# Patient Record
Sex: Female | Born: 1945 | Race: White | Hispanic: No | Marital: Single | Smoking: Current every day smoker
Health system: Southern US, Community
[De-identification: ages and names within clinical notes are randomized; demographics above are authoritative.]

## PROBLEM LIST (undated history)

## (undated) DIAGNOSIS — Z8673 Personal history of transient ischemic attack (TIA), and cerebral infarction without residual deficits: Secondary | ICD-10-CM

## (undated) DIAGNOSIS — F32A Depression, unspecified: Secondary | ICD-10-CM

## (undated) DIAGNOSIS — Z8669 Personal history of other diseases of the nervous system and sense organs: Secondary | ICD-10-CM

## (undated) DIAGNOSIS — K219 Gastro-esophageal reflux disease without esophagitis: Secondary | ICD-10-CM

## (undated) DIAGNOSIS — F329 Major depressive disorder, single episode, unspecified: Secondary | ICD-10-CM

## (undated) DIAGNOSIS — E119 Type 2 diabetes mellitus without complications: Secondary | ICD-10-CM

## (undated) DIAGNOSIS — N201 Calculus of ureter: Secondary | ICD-10-CM

## (undated) DIAGNOSIS — Z87442 Personal history of urinary calculi: Secondary | ICD-10-CM

## (undated) DIAGNOSIS — J189 Pneumonia, unspecified organism: Secondary | ICD-10-CM

## (undated) DIAGNOSIS — Z973 Presence of spectacles and contact lenses: Secondary | ICD-10-CM

## (undated) DIAGNOSIS — E785 Hyperlipidemia, unspecified: Secondary | ICD-10-CM

## (undated) DIAGNOSIS — M1990 Unspecified osteoarthritis, unspecified site: Secondary | ICD-10-CM

## (undated) DIAGNOSIS — E039 Hypothyroidism, unspecified: Secondary | ICD-10-CM

## (undated) DIAGNOSIS — I10 Essential (primary) hypertension: Secondary | ICD-10-CM

## (undated) DIAGNOSIS — R011 Cardiac murmur, unspecified: Secondary | ICD-10-CM

## (undated) DIAGNOSIS — R3915 Urgency of urination: Secondary | ICD-10-CM

## (undated) HISTORY — DX: Essential (primary) hypertension: I10

## (undated) HISTORY — PX: TONSILLECTOMY: SUR1361

## (undated) HISTORY — DX: Gastro-esophageal reflux disease without esophagitis: K21.9

## (undated) HISTORY — DX: Hyperlipidemia, unspecified: E78.5

## (undated) HISTORY — PX: TOTAL ABDOMINAL HYSTERECTOMY W/ BILATERAL SALPINGOOPHORECTOMY: SHX83

## (undated) HISTORY — PX: KNEE ARTHROSCOPY: SHX127

## (undated) HISTORY — PX: CARPAL TUNNEL RELEASE: SHX101

---

## 1975-06-02 HISTORY — PX: OTHER SURGICAL HISTORY: SHX169

## 1997-07-18 ENCOUNTER — Ambulatory Visit (HOSPITAL_COMMUNITY): Admission: RE | Admit: 1997-07-18 | Discharge: 1997-07-18 | Payer: Self-pay | Admitting: Obstetrics & Gynecology

## 1998-07-22 ENCOUNTER — Ambulatory Visit (HOSPITAL_COMMUNITY): Admission: RE | Admit: 1998-07-22 | Discharge: 1998-07-22 | Payer: Self-pay | Admitting: Family Medicine

## 1998-07-22 ENCOUNTER — Encounter: Payer: Self-pay | Admitting: Family Medicine

## 1999-07-30 ENCOUNTER — Encounter: Payer: Self-pay | Admitting: Obstetrics & Gynecology

## 1999-07-30 ENCOUNTER — Ambulatory Visit (HOSPITAL_COMMUNITY): Admission: RE | Admit: 1999-07-30 | Discharge: 1999-07-30 | Payer: Self-pay | Admitting: Obstetrics & Gynecology

## 1999-12-10 ENCOUNTER — Other Ambulatory Visit: Admission: RE | Admit: 1999-12-10 | Discharge: 1999-12-10 | Payer: Self-pay | Admitting: Obstetrics & Gynecology

## 2000-08-10 ENCOUNTER — Encounter: Payer: Self-pay | Admitting: Obstetrics & Gynecology

## 2000-08-10 ENCOUNTER — Ambulatory Visit (HOSPITAL_COMMUNITY): Admission: RE | Admit: 2000-08-10 | Discharge: 2000-08-10 | Payer: Self-pay | Admitting: Obstetrics & Gynecology

## 2001-01-03 ENCOUNTER — Other Ambulatory Visit: Admission: RE | Admit: 2001-01-03 | Discharge: 2001-01-03 | Payer: Self-pay | Admitting: Obstetrics & Gynecology

## 2001-02-17 ENCOUNTER — Encounter: Payer: Self-pay | Admitting: Family Medicine

## 2001-02-17 ENCOUNTER — Encounter: Admission: RE | Admit: 2001-02-17 | Discharge: 2001-02-17 | Payer: Self-pay | Admitting: Family Medicine

## 2001-08-11 ENCOUNTER — Encounter: Payer: Self-pay | Admitting: Obstetrics & Gynecology

## 2001-08-11 ENCOUNTER — Ambulatory Visit (HOSPITAL_COMMUNITY): Admission: RE | Admit: 2001-08-11 | Discharge: 2001-08-11 | Payer: Self-pay | Admitting: Obstetrics & Gynecology

## 2002-01-23 ENCOUNTER — Other Ambulatory Visit: Admission: RE | Admit: 2002-01-23 | Discharge: 2002-01-23 | Payer: Self-pay | Admitting: Obstetrics & Gynecology

## 2002-06-19 ENCOUNTER — Encounter: Payer: Self-pay | Admitting: Family Medicine

## 2002-06-19 ENCOUNTER — Encounter: Admission: RE | Admit: 2002-06-19 | Discharge: 2002-06-19 | Payer: Self-pay | Admitting: Family Medicine

## 2002-06-29 ENCOUNTER — Encounter: Payer: Self-pay | Admitting: Family Medicine

## 2002-06-29 ENCOUNTER — Encounter: Admission: RE | Admit: 2002-06-29 | Discharge: 2002-06-29 | Payer: Self-pay | Admitting: Family Medicine

## 2002-07-06 ENCOUNTER — Encounter: Payer: Self-pay | Admitting: Specialist

## 2002-07-12 ENCOUNTER — Inpatient Hospital Stay (HOSPITAL_COMMUNITY): Admission: RE | Admit: 2002-07-12 | Discharge: 2002-07-13 | Payer: Self-pay | Admitting: Specialist

## 2002-07-12 HISTORY — PX: SHOULDER OPEN ROTATOR CUFF REPAIR: SHX2407

## 2002-11-15 ENCOUNTER — Encounter: Payer: Self-pay | Admitting: Specialist

## 2002-11-17 ENCOUNTER — Inpatient Hospital Stay (HOSPITAL_COMMUNITY): Admission: RE | Admit: 2002-11-17 | Discharge: 2002-11-23 | Payer: Self-pay | Admitting: Specialist

## 2002-11-17 ENCOUNTER — Encounter: Payer: Self-pay | Admitting: Specialist

## 2002-11-17 HISTORY — PX: TOTAL KNEE ARTHROPLASTY: SHX125

## 2003-02-21 ENCOUNTER — Other Ambulatory Visit: Admission: RE | Admit: 2003-02-21 | Discharge: 2003-02-21 | Payer: Self-pay | Admitting: Obstetrics & Gynecology

## 2003-07-10 ENCOUNTER — Ambulatory Visit (HOSPITAL_COMMUNITY): Admission: RE | Admit: 2003-07-10 | Discharge: 2003-07-10 | Payer: Self-pay | Admitting: Family Medicine

## 2004-03-19 ENCOUNTER — Other Ambulatory Visit: Admission: RE | Admit: 2004-03-19 | Discharge: 2004-03-19 | Payer: Self-pay | Admitting: Obstetrics and Gynecology

## 2004-09-03 ENCOUNTER — Ambulatory Visit (HOSPITAL_COMMUNITY): Admission: RE | Admit: 2004-09-03 | Discharge: 2004-09-03 | Payer: Self-pay | Admitting: Obstetrics & Gynecology

## 2004-10-21 ENCOUNTER — Encounter: Admission: RE | Admit: 2004-10-21 | Discharge: 2004-10-21 | Payer: Self-pay | Admitting: Specialist

## 2005-01-05 ENCOUNTER — Inpatient Hospital Stay (HOSPITAL_COMMUNITY): Admission: RE | Admit: 2005-01-05 | Discharge: 2005-01-08 | Payer: Self-pay | Admitting: Specialist

## 2005-01-05 HISTORY — PX: REVISION TOTAL KNEE ARTHROPLASTY: SUR1280

## 2005-04-14 ENCOUNTER — Other Ambulatory Visit: Admission: RE | Admit: 2005-04-14 | Discharge: 2005-04-14 | Payer: Self-pay | Admitting: Obstetrics & Gynecology

## 2005-10-01 ENCOUNTER — Ambulatory Visit (HOSPITAL_COMMUNITY): Admission: RE | Admit: 2005-10-01 | Discharge: 2005-10-01 | Payer: Self-pay | Admitting: Obstetrics & Gynecology

## 2005-10-12 ENCOUNTER — Ambulatory Visit: Payer: Self-pay | Admitting: Gastroenterology

## 2005-11-16 ENCOUNTER — Encounter (INDEPENDENT_AMBULATORY_CARE_PROVIDER_SITE_OTHER): Payer: Self-pay | Admitting: *Deleted

## 2005-11-16 ENCOUNTER — Ambulatory Visit: Payer: Self-pay | Admitting: Gastroenterology

## 2006-09-28 ENCOUNTER — Inpatient Hospital Stay (HOSPITAL_COMMUNITY): Admission: AD | Admit: 2006-09-28 | Discharge: 2006-09-28 | Payer: Self-pay | Admitting: Family Medicine

## 2006-10-06 ENCOUNTER — Ambulatory Visit (HOSPITAL_COMMUNITY): Admission: RE | Admit: 2006-10-06 | Discharge: 2006-10-06 | Payer: Self-pay | Admitting: Obstetrics & Gynecology

## 2006-12-23 ENCOUNTER — Encounter: Admission: RE | Admit: 2006-12-23 | Discharge: 2006-12-23 | Payer: Self-pay | Admitting: Specialist

## 2007-10-07 ENCOUNTER — Ambulatory Visit (HOSPITAL_COMMUNITY): Admission: RE | Admit: 2007-10-07 | Discharge: 2007-10-07 | Payer: Self-pay | Admitting: Obstetrics & Gynecology

## 2007-10-10 ENCOUNTER — Inpatient Hospital Stay (HOSPITAL_COMMUNITY): Admission: AD | Admit: 2007-10-10 | Discharge: 2007-10-10 | Payer: Self-pay | Admitting: Family Medicine

## 2009-07-26 ENCOUNTER — Ambulatory Visit (HOSPITAL_COMMUNITY): Admission: RE | Admit: 2009-07-26 | Discharge: 2009-07-26 | Payer: Self-pay | Admitting: Geriatric Medicine

## 2010-02-12 ENCOUNTER — Encounter
Admission: RE | Admit: 2010-02-12 | Discharge: 2010-02-27 | Payer: Self-pay | Source: Home / Self Care | Admitting: Surgery

## 2010-02-17 ENCOUNTER — Ambulatory Visit (HOSPITAL_COMMUNITY): Admission: RE | Admit: 2010-02-17 | Discharge: 2010-02-17 | Payer: Self-pay | Admitting: Surgery

## 2010-02-24 ENCOUNTER — Ambulatory Visit (HOSPITAL_COMMUNITY): Admission: RE | Admit: 2010-02-24 | Discharge: 2010-02-24 | Payer: Self-pay | Admitting: Surgery

## 2010-03-06 ENCOUNTER — Ambulatory Visit (HOSPITAL_BASED_OUTPATIENT_CLINIC_OR_DEPARTMENT_OTHER): Admission: RE | Admit: 2010-03-06 | Discharge: 2010-03-06 | Payer: Self-pay | Admitting: Surgery

## 2010-03-08 ENCOUNTER — Ambulatory Visit: Payer: Self-pay | Admitting: Internal Medicine

## 2010-06-26 ENCOUNTER — Other Ambulatory Visit (HOSPITAL_COMMUNITY): Payer: Self-pay | Admitting: Geriatric Medicine

## 2010-06-26 DIAGNOSIS — Z1231 Encounter for screening mammogram for malignant neoplasm of breast: Secondary | ICD-10-CM

## 2010-06-26 DIAGNOSIS — Z139 Encounter for screening, unspecified: Secondary | ICD-10-CM

## 2010-07-28 ENCOUNTER — Encounter (HOSPITAL_COMMUNITY): Payer: Self-pay

## 2010-07-28 ENCOUNTER — Ambulatory Visit (HOSPITAL_COMMUNITY)
Admission: RE | Admit: 2010-07-28 | Discharge: 2010-07-28 | Disposition: A | Discharge status: Home / Self Care | Payer: Medicare Other | Source: Ambulatory Visit | Attending: Geriatric Medicine | Admitting: Geriatric Medicine

## 2010-07-28 DIAGNOSIS — Z1231 Encounter for screening mammogram for malignant neoplasm of breast: Secondary | ICD-10-CM | POA: Insufficient documentation

## 2010-07-31 ENCOUNTER — Encounter: Payer: Medicare Other | Attending: Surgery

## 2010-07-31 DIAGNOSIS — Z713 Dietary counseling and surveillance: Secondary | ICD-10-CM | POA: Insufficient documentation

## 2010-07-31 DIAGNOSIS — Z01818 Encounter for other preprocedural examination: Secondary | ICD-10-CM | POA: Insufficient documentation

## 2010-08-11 ENCOUNTER — Other Ambulatory Visit: Payer: Self-pay | Admitting: Surgery

## 2010-08-11 ENCOUNTER — Encounter (HOSPITAL_COMMUNITY): Payer: Medicare Other

## 2010-08-11 LAB — COMPREHENSIVE METABOLIC PANEL
AST: 24 U/L (ref 0–37)
Albumin: 4.3 g/dL (ref 3.5–5.2)
Alkaline Phosphatase: 40 U/L (ref 39–117)
Chloride: 102 mEq/L (ref 96–112)
Creatinine, Ser: 1.3 mg/dL — ABNORMAL HIGH (ref 0.4–1.2)
GFR calc Af Amer: 50 mL/min — ABNORMAL LOW (ref >60)
Potassium: 4.2 mEq/L (ref 3.5–5.1)
Total Bilirubin: 0.6 mg/dL (ref 0.3–1.2)

## 2010-08-11 LAB — DIFFERENTIAL
Basophils Absolute: 0 10*3/uL (ref 0.0–0.1)
Lymphocytes Relative: 20 % (ref 12–46)
Neutro Abs: 6.9 10*3/uL (ref 1.7–7.7)
Neutrophils Relative %: 71 % (ref 43–77)

## 2010-08-11 LAB — CBC
Platelets: 303 10*3/uL (ref 150–400)
RDW: 13 % (ref 11.5–15.5)
WBC: 9.8 10*3/uL (ref 4.0–10.5)

## 2010-08-18 ENCOUNTER — Inpatient Hospital Stay (HOSPITAL_COMMUNITY)
Admission: RE | Admit: 2010-08-18 | Discharge: 2010-08-21 | DRG: 620 | Disposition: A | Discharge status: Home / Self Care | Payer: Medicare Other | Source: Ambulatory Visit | Attending: Surgery | Admitting: Surgery

## 2010-08-18 DIAGNOSIS — Z01818 Encounter for other preprocedural examination: Secondary | ICD-10-CM

## 2010-08-18 DIAGNOSIS — E7800 Pure hypercholesterolemia, unspecified: Secondary | ICD-10-CM | POA: Diagnosis present

## 2010-08-18 DIAGNOSIS — K436 Other and unspecified ventral hernia with obstruction, without gangrene: Secondary | ICD-10-CM | POA: Diagnosis present

## 2010-08-18 DIAGNOSIS — Z6841 Body Mass Index (BMI) 40.0 and over, adult: Secondary | ICD-10-CM

## 2010-08-18 DIAGNOSIS — M129 Arthropathy, unspecified: Secondary | ICD-10-CM | POA: Diagnosis present

## 2010-08-18 DIAGNOSIS — Z01812 Encounter for preprocedural laboratory examination: Secondary | ICD-10-CM

## 2010-08-18 DIAGNOSIS — I10 Essential (primary) hypertension: Secondary | ICD-10-CM | POA: Diagnosis present

## 2010-08-18 DIAGNOSIS — E119 Type 2 diabetes mellitus without complications: Secondary | ICD-10-CM | POA: Diagnosis present

## 2010-08-18 DIAGNOSIS — G4733 Obstructive sleep apnea (adult) (pediatric): Secondary | ICD-10-CM | POA: Diagnosis present

## 2010-08-18 HISTORY — PX: LAPAROSCOPIC GASTRIC BYPASS: SUR771

## 2010-08-18 LAB — GLUCOSE, CAPILLARY
Glucose-Capillary: 149 mg/dL — ABNORMAL HIGH (ref 70–99)
Glucose-Capillary: 242 mg/dL — ABNORMAL HIGH (ref 70–99)

## 2010-08-19 ENCOUNTER — Inpatient Hospital Stay (HOSPITAL_COMMUNITY): Payer: Medicare Other

## 2010-08-19 DIAGNOSIS — Z09 Encounter for follow-up examination after completed treatment for conditions other than malignant neoplasm: Secondary | ICD-10-CM

## 2010-08-19 LAB — GLUCOSE, CAPILLARY
Glucose-Capillary: 189 mg/dL — ABNORMAL HIGH (ref 70–99)
Glucose-Capillary: 195 mg/dL — ABNORMAL HIGH (ref 70–99)
Glucose-Capillary: 209 mg/dL — ABNORMAL HIGH (ref 70–99)
Glucose-Capillary: 222 mg/dL — ABNORMAL HIGH (ref 70–99)

## 2010-08-19 LAB — DIFFERENTIAL
Eosinophils Absolute: 0 10*3/uL (ref 0.0–0.7)
Lymphocytes Relative: 10 % — ABNORMAL LOW (ref 12–46)
Lymphs Abs: 1.4 10*3/uL (ref 0.7–4.0)
Monocytes Relative: 10 % (ref 3–12)
Neutrophils Relative %: 79 % — ABNORMAL HIGH (ref 43–77)

## 2010-08-19 LAB — HEMOGLOBIN A1C: Mean Plasma Glucose: 148 mg/dL — ABNORMAL HIGH (ref <117)

## 2010-08-19 LAB — CBC
HCT: 38.7 % (ref 36.0–46.0)
Hemoglobin: 12.7 g/dL (ref 12.0–15.0)
MCH: 31.1 pg (ref 26.0–34.0)
MCV: 94.6 fL (ref 78.0–100.0)
Platelets: 279 10*3/uL (ref 150–400)
RBC: 4.09 MIL/uL (ref 3.87–5.11)

## 2010-08-19 MED ORDER — IOHEXOL 300 MG/ML  SOLN
50.0000 mL | Freq: Once | INTRAMUSCULAR | Status: AC | PRN
  Administered 2010-08-19: 50 mL via ORAL

## 2010-08-20 LAB — GLUCOSE, CAPILLARY
Glucose-Capillary: 164 mg/dL — ABNORMAL HIGH (ref 70–99)
Glucose-Capillary: 210 mg/dL — ABNORMAL HIGH (ref 70–99)
Glucose-Capillary: 174 mg/dL — ABNORMAL HIGH (ref 70–99)

## 2010-08-20 NOTE — Op Note (Signed)
Sharon Lowe, Sharon Lowe NO.:  [id number]  MEDICAL RECORD NO.:  [id number]           PATIENT TYPE:  I  LOCATION:  1224                         FACILITY:  [facility]  PHYSICIAN:  Thornton Park. Daphine Deutscher, MD  DATE OF BIRTH:  1946-01-09  DATE OF PROCEDURE:  08/18/2010 DATE OF DISCHARGE:                              OPERATIVE REPORT   PREOPERATIVE DIAGNOSES:  Morbid obesity, BMI of 52 with multiple comorbidities including diabetes mellitus for 20 years, hypertension, hypercholesterolemia, arthritis, obstructive sleep apnea.  PROCEDURE:  Laparoscopic Roux-en-Y gastric bypass with takedown of incarcerated ventral hernia and primary repair, upper endoscopy.  40-cm BP limb, 100-cm Roux limb, antecolic-antegastric candy cane to the left, closure of Peterson's defect.  SURGEON:  Thornton Park. Daphine Deutscher, MD  ASSISTANT:  Mary Sella. Andrey Campanile, MD  DESCRIPTION OF PROCEDURE:  This 65 year old white female was taken to room 1 on Monday, August 18, 2010, given general anesthesia.  The abdomen was prepped with PCMX and draped sterilely.  The abdomen was entered through the left upper quadrant using 0-degree Optiview technique without difficulty.  The abdomen was insufflated.  Immediately noted the omentum was stuck up in the anterior abdominal wall.  I placed an upper trocar on the right and through that, I used Harmonic scalpel to take down the omentum and then a second trocar was placed to pull down traction.  I reduced most of it from this incarcerated up into this ventral hernia which was around the umbilicus.  It appeared to have been related to her previous laparotomy and hence was a ventral hernia instead of just a pure umbilical hernia.  Once this was done, standard trocars were used with a 10/11 in the left lower abdomen below the umbilicus.  Another 5 was placed laterally on the left and two 11s were used on the right. A 5 was used in the upper mid abdomen for the [facility]  retractor.  Initially looked at the omentum and I was able to readily identify the ligament of Treitz.  I measured 40 cm distally and divided the bowel with a single application of white cartridge Covidien Endo GIA.  Suture with a latex Penrose was placed upon the Roux limb end.  I then measured 100 cm upstream for the Roux and then laid it side to side to the BP limb.  These were placed opening on either side with a Harmonic scalpel. A 45 white cartridge was inserted and fired creating a common defect. The common opening was then closed from either end with running 2-0 Vicryl.  I tested it with a probe and it felt to be a very secure closure.  Tisseel was applied.  The mesenteric defect was closed with a running 2-0 silk beginning down at the very bottom of the mesentery including an antiobstruction stitch for a Lapra-Ty was placed on the upper portion.  The omentum was divided with Harmonic scalpel.  A Satira Mccallum was applied.  There were some adhesions to the liver and these did not get in our way because these were up to the diaphragm, but at least lifted the liver.  There were  some adhesions on the right side at the gastrohepatic omentum.  We took those down.  She had a small hiatal hernia on the upper GI, she had reflux.  I went posteriorly on the right side and easily saw what looked to be slightly patulous esophagus posteriorly.  I put a single suture posteriorly to approximate the crura.  I then put in a ruler and measured 5 cm down the lesser curvature and I went in and dissected free the fat from around the stomach.  The ruler was removed.  I then got around the retrogastric space.  Everything was out of the stomach.  I applied the blue load 6-cm Covidien stapler, fired that twice across and then up and then finished with an application of the duet cartridge using 6 and then a 4.5.  This was done again using the calibration tubing in and out to make sure we were not too  close to the EG junction.  Once this was done, the Roux limb was then brought up and sutured along the back wall with running 2-0 Vicryl. It was tied on the left and a tie knot was used on the right.  Common defects were made in the stomach on the patient's right side and the 4.5- mm blue load was inserted and fired.  The common defect was closed from either end with 2-0 Vicryls.  The Ewald tube was placed across the anastomosis.  Then, a second layer was used of running 2-0 Vicryl.  This was done with a free needle with tie knots at either end.  The Petersen's defect was repaired appreciating the mesentery, the colon and suturing it up to the mesentery of the Roux limb with a figure-of-8 of 2-0 silk and tie knot.  We then clamped off the outflow and Dr. Andrey Campanile endoscoped the patient, got good distention with air of the pouch in the proximal bowel, put this under water and after irrigating with saline, no bubbles were noted.  The pouch looked to be a good size of 5 cm long and no leaks were noted, no bleeding was noted.  This gas was then removed.  I then went down and made some small incisions around the umbilical defect. Then, I used the Endoclose to bring in 0 Novafil and 3 such Novafils were placed and tied down, this completely obliterated the hernia defect.  I injected all port sites with some lidocaine and Marcaine mix and closed the wounds with 4-0 Vicryl, benzoin, and Steri-Strips.  Some Dermabond was used in the little holes around the belly button.  The patient tolerated the procedure well and was taken to the recovery room in satisfactory addition.  She will likely go to the step-down unit postop.     Thornton Park Daphine Deutscher, MD     MBM/MEDQ  D:  08/18/2010  T:  08/19/2010  Job:  [id number]  cc:   Hal T. Stoneking, M.D. Fax: [phone]  Electronically Signed by Luretha Murphy MD on 08/20/2010 01:56:14 PM

## 2010-08-21 LAB — GLUCOSE, CAPILLARY

## 2010-09-02 ENCOUNTER — Encounter: Payer: Medicare Other | Attending: Surgery

## 2010-09-02 DIAGNOSIS — Z01818 Encounter for other preprocedural examination: Secondary | ICD-10-CM | POA: Insufficient documentation

## 2010-09-02 DIAGNOSIS — Z713 Dietary counseling and surveillance: Secondary | ICD-10-CM | POA: Insufficient documentation

## 2010-09-15 NOTE — Discharge Summary (Signed)
  NAMENAVY, ROTHSCHILD NO.:  [id number]  MEDICAL RECORD NO.:  [id number]           PATIENT TYPE:  I  LOCATION:  1224                         FACILITY:  [facility]  PHYSICIAN:  Thornton Park. Daphine Deutscher, MD  DATE OF BIRTH:  11-Dec-1945  DATE OF ADMISSION:  08/18/2010 DATE OF DISCHARGE:  08/21/2010                              DISCHARGE SUMMARY   ADMITTING DIAGNOSIS:  Morbid obesity, BMI of 52, multiple comorbidities including diabetes.  PROCEDURE:  Laparoscopic Roux-en-Y gastric bypass with takedown of incarcerated ventral hernia and primary repair, upper endoscopy.  INTRAOPERATIVE FINDINGS:  Incarcerated hernia containing omentum.  COURSE IN HOSPITAL:  Ms. Goins is a 65 year old lady who underwent the above-mentioned operation.  She was taken to the ICU postoperatively for observation.  She did very well.  His swallow looked good.  Hemoglobin was stable.  She was ready to move upstairs, but there were no beds, so she stayed in step-down.  She continued to do well and was ready for discharge postoperative day #3.  Vital signs stable.  She was given Roxicet elixir to take for pain and will be followed up in the office in 2 weeks.  That appointments had already been made.  Prescription for Roxicet elixir is on the chart.  CONDITION:  Good.     Thornton Park Daphine Deutscher, MD     MBM/MEDQ  D:  08/21/2010  T:  08/21/2010  Job:  [id number]  Electronically Signed by Luretha Murphy MD on 09/15/2010 09:26:13 AM

## 2010-10-14 ENCOUNTER — Ambulatory Visit: Payer: Medicare Other | Admitting: *Deleted

## 2010-10-14 ENCOUNTER — Encounter: Payer: Medicare Other | Attending: Surgery | Admitting: *Deleted

## 2010-10-14 DIAGNOSIS — Z713 Dietary counseling and surveillance: Secondary | ICD-10-CM | POA: Insufficient documentation

## 2010-10-14 DIAGNOSIS — Z01818 Encounter for other preprocedural examination: Secondary | ICD-10-CM | POA: Insufficient documentation

## 2010-10-17 NOTE — H&P (Signed)
NAMECHISTINA, Sharon Lowe                           ACCOUNT NO.:  [id number]   MEDICAL RECORD NO.:  [id number]                   PATIENT TYPE:  INP   LOCATION:  NA                                   FACILITY:  [facility]   PHYSICIAN:  Jene Every, M.D.                 DATE OF BIRTH:  [birth year]   DATE OF ADMISSION:  07/12/2002  DATE OF DISCHARGE:                                HISTORY & PHYSICAL   CHIEF COMPLAINT:  Left shoulder pain.   HISTORY OF PRESENT ILLNESS:  Sharon Lowe is a 65 year old female who  presented to our office initially for bilateral knee pain although she was  having left shoulder pain. During that time, her primary care physician, Dr.  Artis Flock, ordered an MRI study of her left shoulder which showed she had a  rotator cuff tear on the left. It was felt at this time that prior to  proceeding with a total knee arthroplasty due to the recovery needed for  that we needed to go ahead and repair or rotator cuff at this time. The  risks and benefits of the surgery were discussed with the patient. The  patient received medical clearance from Dr. Artis Flock and wishes to proceed with  a rotator cuff repair on the left.   PAST MEDICAL HISTORY:  Significant for hypertension, type 1 diabetes  mellitus, fluid retention, history of mini strokes, hypothyroidism,  hypercholesterolemia, gastroesophageal reflux disease.   PAST SURGICAL HISTORY:  Knee arthroscopy, hysterectomy, massive abdominal  infection due to IUD placement in 1977.   CURRENT MEDICATIONS:  1. Lisinopril.  2. HCTZ 20/12.5 one p.o. daily.  3. Glucovance 2.5/500 mg one p.o. b.i.d.  4. Potassium CLER 20 mEq one p.o. daily.  5. Wellbutrin XL 300 mg one p.o. daily.  6. Paxil 20 mg one p.o. daily.  7. Premarin 0.625 mg one p.o. daily.  8. Levoxyl 75 mcg one p.o. daily.  9. Zocor 40 mg one p.o. daily.  10.      Actos 45 mg one p.o. daily.  11.      Aggrenox 25 mg one p.o. b.i.d.  12.      Lasix 20 mg p.r.n.  13.       Prilosec over the counter one p.o. daily.  14.      Advil 800 mg 1-3 times p.o. daily.  15.      Humulin 50 units a.m. and then p.m. on a sliding scale.   ALLERGIES:  The patient is allergic to SULFA DRUGS.   SOCIAL HISTORY:  The patient is separated, she admits to 1-2 cigarettes per  week. Denies any alcohol intake. She lives in a one story home.   FAMILY HISTORY:  Father deceased of MI at age 50. Mother has hypertension,  diabetes and osteoarthritis.   REVIEW OF SYMPTOMS:  GENERAL:  The patient denies any fever, chills, night  sweats or bleeding  tendencies. CNS:  No blurred or double vision, seizure,  headache or paralysis. CARDIOVASCULAR:  No chest pain, angina or orthopnea.  RESPIRATORY:  No shortness of breath, productive cough or hemoptysis. GU:  No dysuria, hematuria, or discharge. GI:  No diarrhea, constipation, melena  or bloody stools.   PHYSICAL EXAMINATION:  VITAL SIGNS:  Pulse is 80, respiratory rate 16, blood  pressure 112/88.  GENERAL:  This is a well-developed, well-nourished, 65 year old female in  mild distress.  HEENT:  Normocephalic, atraumatic. Pupils equal round and reactive to light.  EOMs intact.  NECK:  Supple, no lymphadenopathy.  CHEST:  Clear to auscultation bilaterally. No rhonchi, wheezes, or rales.  BREASTS:  Not examined, not pertinent to HPI.  HEART:  Regular rate and rhythm without murmur, gallop or rub.  ABDOMEN:  Soft, nontender, nondistended, protuberant in nature. Bowel sounds  x4.  GU:  Not examined not pertinent to HPI.  SKIN:  No rashes or lesions are noted.  EXTREMITIES:  In regard to the left shoulder, the patient has positive  impingement sign with decrease in internal rotation.   IMPRESSION:  1. Left rotator cuff tear.  2. Hypertension.  3. Type 1 diabetes.  4. Fluid retention.  5. History of mini strokes.  6. Hypothyroidism.  7. Hypercholesterolemia.  8. Gastroesophageal reflux disease.   PLAN:  The patient will be admitted  to [facility] to undergo a  left rotator cuff repair or possible patch graft. The patient does have  medical clearance from Dr. Artis Flock. He will be asked to follow along in the  patient's care.     Roma Schanz, P.A.                   Jene Every, M.D.    CS/MEDQ  D:  07/06/2002  T:  07/06/2002  Job:  [id number]

## 2010-10-17 NOTE — H&P (Signed)
Sharon Lowe, Sharon Lowe                           ACCOUNT NO.:  [id number]   MEDICAL RECORD NO.:  [id number]                   PATIENT TYPE:  INP   LOCATION:  NA                                   FACILITY[facility]  [facility]   PHYSICIAN:  Javier Docker, M.D.             DATE OF BIRTH:  13-May-1946   DATE OF ADMISSION:  DATE OF DISCHARGE:                                HISTORY & PHYSICAL   CHIEF COMPLAINT:  Left knee pain.   HISTORY OF PRESENT ILLNESS:  The patient is a 65 year old female who has a  longstanding history of left knee pain.  She previously had arthroscopic  debridement which was successful for several years.  However, she had a  return of her pain.  She noted difficulty with weightbearing, exquisitely  tender along the medial joint line.  She had noticed significant loss of  range of motion -3 to 100 degrees.  Patellofemoral pain with compression.  The patient also felt like it was affecting her gait.  Radiographs of the  knee demonstrate end-stage osteoarthritis of the left knee, particularly the  medial compartment.  Due to the fact that the patient had failed  conservative treatment of arthroscopic debridement, anti-inflammatories, and  corticosteroid injections, it is felt she would benefit from a total knee  arthroplasty.  The risks and benefits of the surgery were discussed with the  patient and she wishes to proceed.  Medical clearance from Dr. Artis Flock was  received; however, he wishes for the patient to have a Cardiology  evaluation.  She is actually scheduled for a Cardiolite stress test and  Cardiology followup prior to surgery.   PAST MEDICAL HISTORY:  1. Noninsulin-dependent diabetes.  2. Hypertension.  3. Obesity.  4. Hypercholesterolemia.  5. Osteoarthritis.  6. Depression.  7. Gastroesophageal reflux disease.   CURRENT MEDICATIONS:  1. Prilosec 20 mg one p.o. daily.  2. Levoxyl 75 mcg one p.o. daily.  3. Aggrenox 200/25 mg one p.o. b.i.d.  4. Paroxetine 20  mg one p.o. daily.  5. Actos 45 mg one p.o. daily.  6. Wellbutrin XL 300 mg one p.o. daily.  7. Premarin 0.625 mg one p.o. daily.  8. Potassium 20 mEq one p.o. daily.  9. Lisinopril/HCTZ 20/12.5 mg one p.o. daily.  10.      Zocor 40 mg one p.o. daily.  11.      Glyburide METF 2.5/500 one p.o. b.i.d.  12.      Furosemide 20 mg 1-2 daily.  13.      Advil 200 mg four p.o. b.i.d.   ALLERGIES:  SULFA drugs.   PAST SURGICAL HISTORY:  1. Rotator cuff repair.  2. Hysterectomy.  3. Knee arthroscopy.  4. Abdominal infection 1977.   SOCIAL HISTORY:  The patient is separated.  She denies any tobacco or  alcohol intake.  She lives in a one story home.  She currently does not have  a caregiver following surgery.   FAMILY HISTORY:  Father deceased at age 33 of coronary artery disease.  Mother and brother both have hypertension.  Mother also has history of  diabetes.   REVIEW OF SYSTEMS:  GENERAL:  The patient denies any fever, chills, night  sweats, or bleeding tendencies.  CENTRAL NERVOUS SYSTEM:  No blurred/double  vision, seizure, headache, or paralysis.  RESPIRATORY:  No shortness of  breath, productive cough, or hemoptysis.  CARDIOVASCULAR:  No chest pain,  angina, orthopnea.  However, the patient does have dyspnea on exertion which  is unchanged.  GENITOURINARY:  No dysuria, hematuria, or discharge.  GASTROINTESTINAL:  No nausea, vomiting, diarrhea, constipation, bloody  stools.  MUSCULOSKELETAL:  As pertinent to HPI.   PHYSICAL EXAMINATION:  VITAL SIGNS:  Pulse 80, respirations 16, blood  pressure 146/84.  GENERAL:  This is an obese 65 year old female in no acute distress.  She  does walk with an antalgic gait.  HEENT:  Normocephalic, atraumatic.  Pupils are equal, round, and reactive to  light.  EOMs intact.  NECK:  Supple.  No lymphadenopathy.  CHEST:  Clear to auscultation bilaterally.  No rhonchi, wheezes, or rales.  BREASTS/GENITALIA:  Not examined.  Not pertinent to HPI.   HEART:  Regular rate and rhythm with a 3/6 systolic ejection murmur.  ABDOMEN:  Soft, nontender, nondistended.  Bowel sounds x4.  SKIN:  No rashes or lesions are noted.  EXTREMITIES:  Left knee range of motion is -3 to 110 degrees.  The patient  is tender to palpation along the medial joint line.  She does have positive  patellofemoral pain with compression.   RADIOLOGICAL DATA:  X-rays reveal end-stage osteoarthritis of the left knee.   IMPRESSION:  Degenerative joint disease left knee.   PLAN:  The patient will be admitted to [facility] to undergo left  total knee arthroplasty by Dr. Jene Every pending cardiac clearance.     Christine D. Iran Ouch, P.A.-C              Javier Docker, M.D.    CDS/MEDQ  D:  11/09/2002  T:  11/09/2002  Job:  [id number]

## 2010-10-17 NOTE — Op Note (Signed)
NAMEPAULLA, Sharon Lowe                           ACCOUNT NO.:  [id number]   MEDICAL RECORD NO.:  [id number]                   PATIENT TYPE:  INP   LOCATION:  0457                                 [facility]ACILITY:  [facility]   PHYSICIAN:  Jene Every, M.D.                 DATE OF BIRTH:  1946/05/04   DATE OF PROCEDURE:  11/17/2002  DATE OF DISCHARGE:                                 OPERATIVE REPORT   PREOPERATIVE DIAGNOSIS:  Degenerative joint disease of the left knee.   POSTOPERATIVE DIAGNOSIS:  Degenerative joint disease of the left knee.   PROCEDURE PERFORMED:  Left total knee arthroscopy.   ANESTHESIA:  General.   SURGEON:  Javier Docker, M.D.   ASSISTANT:  Roma Schanz, P.A.   COMPONENTS UTILIZED:  Osteonics posterior cruciate-sacrificing components, 9  tibia, 9 femur, 12 insert, 26 patella.   BRIEF HISTORY AND INDICATIONS:  A 65 year old with end-stage osteoarthrosis  of the left knee.  Operative intervention was indicated for replacement of  degenerated joint.  Risks and benefits discussed including bleeding,  infection, damage to neurovascular structures, no change in symptoms, loss  of motion, need for revision.   TECHNIQUE:  The patient in supine position.  After induction of adequate  general anesthesia and 2 g Kefzol, the left lower extremity was prepped and  draped in the usual sterile fashion.  Thigh tourniquet inflated to 350 mmHg.  Midline incision was made in the skin.  Subcutaneous tissue was dissected.  Electrocautery was utilized to achieve hemostasis.  Median parapatellar  arthrotomy was performed.  The patella was everted, knee was flexed.  Tricompartmental osteoarthrosis, particularly in the medial compartment, was  noted.  Osteophytes removed with a rongeur.  ACL removed.  Medial and  lateral menisci were removed.  Step-drill utilized to enter the femoral  canal, irrigated, intramedullary guide 5-degree left placed, 10-mm utilized  to cut from the distal  femur. This was then sized optimally for a 9.  Utilizing the __________ gauge, bisecting the intercondylar notch, it was  transfixed to the femur.  Anterior and posterior chamfer cuts were  performed, protecting the posterior element at all times.  The PCL was found  to be deficient.  We performed a box chisel and a chisel for the  patellofemoral groove.  Next, we utilized an oscillating saw and removed the  tibial spine.  We had sized the femur to a 9, sized the tibia to a 9, and  entered the canal in the tibia.  External alignment guide medial to the  tibial tubercle, parallel to the tibia, bisecting the malleoli of the ankle,  transfixed with __________ 0.6 mm below the medial tibial plateau.  It was  fixed in the appropriate rotation.  A 10, 0-degree cut was utilized for the  proximal tibia.  With the posterior elements well-protected, we cut the  proximal tibia and removed it.  Also removed  the remnants posteriorly of the  menisci and of the PCL.  Joint osteophytes were removed posteriorly, as were  loose bodies.  Next, the wound was copiously irrigated, placed a trial femur  9 on the femur, trial 12 insert with the tibia in full extension; good  flexion with stability with varus-valgus stressing and external alignment  guide with the appropriate rotation of similar fashion.  We marked the  rotation of the tibia.  The patella was extremely osteophytic, sized with a  26.  The patella was drilled 10 mm in depth.  Peg holes were drilled as  well.  Next, the trials were removed, knee flexed, the tibia subluxed.  At  this point in time, we found that the tourniquet had been deficient  throughout, so we repositioned the tourniquet, exsanguinated, and re-  inflated.  A set of 21 minutes.  After the tibia was flexed, subluxed,  appropriate rotation, pinned, punch guides performed.  The knee was then  copiously irrigated.  We reinspected posteriorly, and there was an equal  flexion and  extension gap, no residual osteophytes noted.  The wound was  copiously irrigated with pulsatile lavage.  It was flexed and dry cement was  mixed in the usual fashion, placing the tibial canal onto the tibial  plateau, femur, and the runners of the prosthesis.  The tibia was impacted  into place with appropriate rotation and redundant cement removed.  Trial 12  was placed, femoral component impacted, redundant cement removed.  Knee was  then reduced, held in extension, axial load applied, and the residual cement  removed with appropriate curing of the cement.  A 26 patella button was then  clamped, residual cement removed, osteophytes removed with a rongeur.  After  appropriate curing of the cement, we removed the trial.  We removed  redundant cement.  Trialed best at a 12, flexion 140, extension to 0, good  stability to varus-valgus stressing, from 0 to 40 degrees.  Permanent 12 was  then placed, evaluated with the Glorious Peach, and found to be locked correctly.  Fully reduced.  Wound copiously irrigated.  At this point, the tourniquet  was deflated.  We had used all along adrenalin-soaked sponges.  Hemovac was  placed and brought out through a lateral stab wound in the skin.  Adrenalin-  soaked sponges were utilized.  Patellar arthrotomy repaired with #1 Vicryl  interrupted figure-of-eight sutures.  Subcutaneous tissue reapproximated  with 2-0 Vicryl simple sutures.  Skin was reapproximated with staples, and  we performed the lateral retinaculum release just prior to that with normal  patellofemoral tracking.  We had flexion to 140 degrees in full extension  after final closure.  The wound was dressed sterilely, secured with an Ace  bandage.  The patient was extubated without difficulty and transported to  the recovery room in satisfactory condition.   The patient tolerated the procedure well with no complications.                                              Jene Every, M.D.    [name]   D:  11/17/2002  T:  11/18/2002  Job:  [id number]

## 2010-10-17 NOTE — Discharge Summary (Signed)
Sharon Lowe, Sharon Lowe                           ACCOUNT NO.:  [id number]   MEDICAL RECORD NO.:  [id number]                   PATIENT TYPE:  INP   LOCATION:  0457                                 FACILITY:  [facility]   PHYSICIAN:  Jene Every, M.D.                 DATE OF BIRTH:  12-16-1945   DATE OF ADMISSION:  11/17/2002  DATE OF DISCHARGE:  11/23/2002                                 DISCHARGE SUMMARY   ADMISSION DIAGNOSES:  1. Degenerative joint disease of the left knee.  2. Non-insulin dependent diabetes.  3. Hypertension.  4. Obesity.  5. Hypercholesterolemia.  6. Osteoarthritis.  7. Depression.  8. Gastroesophageal reflux.   DISCHARGE DIAGNOSES:  1. Degenerative joint disease left knee status post left total knee     arthroplasty.  2. Postoperative anemia resolved.  3. Hyponatremia.  4. Non-insulin-dependent diabetes.  5. Hypertension.  6. Obesity.  7. Hypercholesterolemia.  8. Osteoarthritis.  9. Depression.  10.      Gastroesophageal reflux disease.   PROCEDURE:  The patient was taken to the OR on November 17, 2002 to undergo left  total knee arthroplasty, surgeon Jene Every, M.D., assistant Roma Schanz, P.A., anesthesia general. One Hemovac drain was placed at the time  of surgery.   CONSULTATIONS:  PT, OT, rehab.   HISTORY OF PRESENT ILLNESS:  Ms. Vizcarrondo is a 65 year old female with a  longstanding history of left knee pain. The patient has had previous  arthroscopic debridement; however, has return of her pain. She noted  difficulty with activities especially along the medial joint line. The  patient had significant loss of range of motion -3 to 100 degrees,  patellofemoral pain with compression. The patient received multiple  corticosteroid injections as well as anti-inflammatories. Her new pain  continued to be disabling. It was felt at this point due to the fact the  patient failed conservative treatment that she would benefit from a total  knee  arthroplasty. The risks and benefits of the surgery were explained to  the detail and she wishes to proceed. Medical clearance was obtained from  Dr. Artis Flock as well as a cardiology clearance.   LABORATORY DATA:  Preoperative CBC shows a white blood cell count of 7.8,  hemoglobin 13.1, hematocrit 38.3. Serial H&H's were followed throughout the  hospital course. The patient did drop to a level of 7.7 with a hematocrit of  22.3 following 2 units of packed red blood cells. At the time of discharge,  hemoglobin was 9.9, hematocrit 28.7. PT/INR preoperatively showed PT of  13.6, INR of 1.0. Coagulation studies were followed throughout the hospital  course. The patient was therapeutic at the time of discharge with a PT of  21.9, INR of 2.2. Routine chemistries done preoperatively showed sodium 137,  potassium 3.8, glucose of 232. Routine chemistries were followed throughout  the hospital course and showed a drop in her sodium  to 132; however, this  had resolved at the time of discharge to a level of 134. At the time of  discharge, she did have a slightly elevated BUN of 26. Routine liver  functions did show a slightly elevated AST of 73. Urinalysis done  preoperatively showed cloudy urine with greater than 1000 mg/DL of glucose  with many epithelial cells and few bacteria. Blood type is A negative. EKG  done preoperatively shows normal sinus rhythm. Preoperative chest x-ray I do  not see in chart. Doppler was obtained during the patient's hospital course  which showed no evidence of DVT, superficial thrombosis or Bake cyst.   HOSPITAL COURSE:  The patient was taken to the OR and underwent the above  stated procedure without difficulty. She was being transferred to the PACU  and then to the orthopedic floor for continued postoperative care. At the  time of surgery, one Hemovac drain was placed. The patient was placed on PCA  analgesics. Coumadin was started, regulated per pharmacy. Home  medications  were resumed as well as sliding scale for her diabetes. On postoperative day  #1, the patient complained of right calf cramps. This was called to the PA  on-call which helped CPM overnight and increased IV fluids. Doppler was  ordered postoperative day #1 bilateral lower extremities to rule out DVT.  Doppler was obtained which was negative for DVT or superficial thrombosis.  The patient had right hyponatremia postoperative day #1; however, was  asymptomatic from this. PT, OT withheld pending Doppler results.  Postoperative day #2, the patient continued to do fairly well with a  moderate amount of pain. She did have a drop in her hemoglobin to 8.8 and  hematocrit 25.5; however, was asymptomatic. Continued to have a drop in her  sodium to 132. Dressing was changed, incision was clean and dry with  serosanguinous drainage. IV fluid was switched to normal saline.  Postoperative day #3, the patient continued to do fairly well. She continued  to be asymptomatic and her postoperative hemoglobin was 8.2, hematocrit was  27.3, resolved hyponatremia with sodium of 134, potassium was 3.9. The  patient was therapeutic on her Coumadin with a PT of 23.7, INR of 2.5. The  patient continued to have serosanguinous drainage from her incision. The  patient was weaned from her PCA today to p.o. analgesics. Throughout the  hospital course the patient advanced very well with physical therapy. She  required minimal assistance. Postoperatively on day #4, the patient is  ambulating 60 feet with minimal assistance. Hemoglobin continued to drop to  a level of 8.0, hematocrit 23.4 and continued  to be significant amount of  serosanguineous drainage. Dressing changes were initiated t.i.d. at this  point. Keflex was started. Discharge planning was initiated pending  normalization in her hemoglobin. Postoperative day #5 hemoglobin dropped to a level of 7.7 and the patient had pale and dry mucosal membranes.  It was  felt at this point, she would require transfusion. Coumadin was held, CPM  was held. Postoperative day #5, the patient was doing much better. She did  notice significant improvement in her energy level. Hemoglobin had  normalized to a level of 9.9, hematocrit 28.7, PT/INR 21.9 and 2.2. The  patient was doing extremely well with physical therapy. She was requiring  only minimal assistance. She was walking 220 feet with rolling walker going  up and down stairs. It was felt at this point, the patient could be  discharged home with home health PT, OT  with her durable medical goods.  Hemoglobin had stabilized; however, she continued to have scant drainage  from her wound. She was continued on Keflex with daily dressing changes.   DISCHARGE INSTRUCTIONS:  The patient should followup with Dr. Shelle Iron in one  week for reevaluation. She should keep her incision clean and dry, she  should change her dressing at least 1-2 times daily. Genevieve Norlander will followup  for home health and Regulation of her PT/INR.   DISCHARGE MEDICATIONS:  She should resume all home medications, Coumadin at  2.5 mg and 1 p.o. daily, Robaxin 500 mg one p.o. q. 8 p.r.n. spasm, Magic  mouthwash p.r.n., Keflex 250 mg, 1 p.o. q.i.d., Percocet 1-2 p.o. q. 4-6  p.r.n. pain.    CONDITION ON DISCHARGE:  Stable.   FINAL DIAGNOSIS:  Status post left total knee arthroplasty.     Roma Schanz, P.A.                   Jene Every, M.D.    CS/MEDQ  D:  12/06/2002  T:  12/06/2002  Job:  [id number]

## 2010-10-17 NOTE — H&P (Signed)
NAMESALEENA, TAMAS NO.:  [id number]   MEDICAL RECORD NO.:  [id number]           PATIENT TYPE:   LOCATION:                                 FACILITY:   PHYSICIAN:  Jene Every, M.D.         DATE OF BIRTH:   DATE OF ADMISSION:  DATE OF DISCHARGE:                                HISTORY & PHYSICAL   CHIEF COMPLAINT:  Painful left total knee arthroplasty.   HISTORY:  Ms. Sharon Lowe is a 65 year old female who had a total knee  arthroplasty performed in 2004. The patient did quite well for some time and  then gradually noted onset of anterior knee pain, more so when she was going  up and down stairs or getting up from a seated position. The patient was  worked up for an infection. Lab work was negative. She had bone scan of the  knee which showed increased activity at the patella. However, x-rays of the  knee show no evidence of fracture. The patient was treated conservatively  with bracing and medications without any relief of her symptoms. It is felt  she needed to undergo revision of the patella component of her total knee  arthroplasty. The risks and benefits of the surgery were discussed with the  patient and she wishes to proceed.   MEDICAL HISTORY:  Significant for:  1.  Non-insulin-dependent diabetes.  2.  Hypertension.  3.  Obesity.  4.  Hypercholesterolemia.  5.  Osteoarthritis.  6.  Depression.  7.  Gastroesophageal reflux disease.   CURRENT MEDICATIONS:  1.  Simvastatin 40 mg one p.o. q.h.s.  2.  Paxil 20 mg one p.o. q.a.m.  3.  Furosemide 40 mg one to two p.o. p.r.n.  4.  Potassium 20 mEq one p.o. q.a.m.  5.  Glyburide/metformin 2.5/500 one p.o. b.i.d.  6.  Wellbutrin XL 300 mg one p.o. q.a.m.  7.  Lisinopril/hydrochlorothiazide 20/12/5 mg one p.o. q.a.m.  8.  Levothyroxine 0.075 mg one p.o. q.a.m.  9.  Actos 45 mg one p.o. q.a.m.  10. Aggrenox one p.o. b.i.d.  11. Prilosec 20 mg one p.o. q.a.m.  12. Humulin N 10 units q.a.m., 50 units  q.h.s.  13. Ibuprofen p.r.n.  14. Aspirin 81 mg one p.o. daily.  15. Vitamins including B6.   ALLERGIES:  SULFA DRUGS.   PREVIOUS SURGERY:  1.  Left total knee arthroplasty.  2.  Rotator cuff repair.  3.  Hysterectomy.  4.  Knee arthroscopy.  5.  Abdominal infection.   SOCIAL HISTORY:  The patient is separated. She denies any tobacco or alcohol  intake. She lives in a [place] home.   FAMILY HISTORY:  Father deceased at age 23, coronary artery disease. Mother  and brother both have hypertension. Mother also has history of diabetes.   REVIEW OF SYSTEMS:  GENERAL:  The patient denies any fever, chills, night  sweats, or bleeding tendencies. CNS:  No double vision, seizure, headache,  or paralysis. RESPIRATORY:  No shortness of breath, productive cough, or  hemoptysis. CARDIOVASCULAR:  No chest pain, angina, or orthopnea.  GU:  No  dysuria, hematuria, or discharge. GI:  No nausea, vomiting, diarrhea,  constipation, blood stools.  MUSCULOSKELETAL: Pertinent to the HPI.   PHYSICAL EXAMINATION:  Taken from the health and history sheet.  VITAL SIGNS:  Temperature is 97.8, heart rate 71, blood pressure is 150/70.  Height is 5 feet 6 inches, weight is 346. O2 saturations 95%.  GENERAL:  This is a morbidly-obese female in no acute distress.  HEENT:  Atraumatic, normocephalic. Pupils equal, round, reactive to light.  EOMs intact.  NECK:  Supple, no lymphadenopathy.  CHEST:  Clear to auscultation bilaterally with no rhonchi, wheezes, or  rales.  BREAST, GENITOURINARY:  Not examined, not pertinent to HPI.  HEART:  Regular rate and rhythm without murmurs, gallops, rubs.  ABDOMEN:  Soft, nontender, nondistended, bowel sounds x4.  SKIN:  No rashes or lesions are noted.  EXTREMITIES:  The patient does have very well healed incision at left total  knee. She is exquisitely tender on the patellar tendon. Calf soft,  nontender.   IMPRESSION:  Painful left total knee with uptake noted on bone  scan.   PLAN:  The patient will be taken to the OR to undergo revision of the left  total knee.      Roma Schanz, P.A.      Jene Every, M.D.  Electronically Signed    CS/MEDQ  D:  02/19/2005  T:  02/19/2005  Job:  [id number]

## 2010-10-17 NOTE — Op Note (Signed)
NAMEAUSTYN, Sharon Lowe NO.:  [id number]   MEDICAL RECORD NO.:  [id number]                    PATIENT TYPE:   LOCATION:                                       FACILITY:   PHYSICIAN:  Jene Every, M.D.                 DATE OF BIRTH:   DATE OF PROCEDURE:  07/12/2002  DATE OF DISCHARGE:                                 OPERATIVE REPORT   PREOPERATIVE DIAGNOSES:  Rotator cuff tear, left shoulder.   POSTOPERATIVE DIAGNOSES:  Rotator cuff tear, left shoulder.   PROCEDURE:  Open rotator cuff repair, subacromial decompression,  acromioplasty.   ANESTHESIA:  General.   ASSISTANT:  Roma Schanz, P.A.   BRIEF HISTORY:  A 65 year old with refractory shoulder pain. MRI indicating  rotator cuff tear. Operative intervention was indicated for rotator cuff  repair to prevent infection and further tearing and subacromial  decompression. The risks and benefits were discussed including bleeding,  infection, damage to neurovascular structures, __________ range of motion,  protracted postoperative course, etc.   TECHNIQUE:  The patient in supine beach chair position after an adequate  level of general anesthesia and 1 gm of Kefzol, the left shoulder and upper  extremity was prepped and draped in the usual sterile fashion. A surgical  marker was utilized to delineate the acromion. An incision was made over the  anterior aspect of the acromion and Longer's lines. The subcutaneous tissue  was dissected, electrocautery utilized to achieve hemostasis. The raphe  between the anterior lateral heads of the deltoid were identified, divided,  subperiosteally elevated from the anterior aspect of the acromion  meticulously with the subperiosteal elevator. An acromioplasty was performed  with a Beyer rongeur and a high speed bur converting it to a type 1. The CA  ligament was resected. Hypertrophic bursa was identified and excised as  well. We digitally mobilized the cuff  and digitally lysed adhesions in the  subacromial space and provided normal range of motion. Next, we identified a  tear to the anterior leading edge of the supraspinatus. It was more of a  side to side as opposed to a retracted tear. I excised the leading edges of  the tear which then measured approximately 1 1/2 cm x 2-3 mm and then  decorticated the bone just beneath it to good bleeding tissue, mobilized the  cuff and repaired it side to side with #1 Vicryl interrupted figure-of-eight  sutures with excellent repair and no tension on the wound and good range of  motion following that. The wound was copiously irrigated once again,  repaired the raphe with #1 Vicryl interrupted figure-of-eight sutures over  to the acromion, subcutaneous tissue reapproximated with #2-0 Vicryl simple  sutures, skin was reapproximated with 4-0 subcuticular Prolene. The wound  was dressed sterilely, reinforced with Steri-Strips and an abduction pillow  applied. She was awakened without difficulty and transported to the  recovery  room in satisfactory condition.   The patient tolerated the procedure well with no complications.                                               Jene Every, M.D.    [name]  D:  07/12/2002  T:  07/12/2002  Job:  [id number]

## 2010-10-17 NOTE — Op Note (Signed)
NAMEVINCENZA, Sharon Lowe NO.:  [id number]   MEDICAL RECORD NO.:  [id number]           PATIENT TYPE:   LOCATION:                                 FACILITY:   PHYSICIAN:  Jene Every, M.D.         DATE OF BIRTH:   DATE OF PROCEDURE:  01/05/2005  DATE OF DISCHARGE:                                 OPERATIVE REPORT   PREOPERATIVE DIAGNOSIS:  Quadriceps tendon tear, possible loose patellar  component.   POSTOPERATIVE DIAGNOSES:  1.  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  2.  Loose patellar component.  3.  Quadriceps adhesions.   PROCEDURE PERFORMED:  1.  Revision of left total knee arthroplasty with removal of patellar      component.  2.  Repair of quadriceps tendon tear.  3.  Quadroplasty.  4.  Excision of nonunion of the patella.  5.  __vmo________  advancement.   ANESTHESIA:  General.   ASSISTANT:  Roma Schanz, P.A.   BRIEF HISTORY/INDICATIONS:  This is a 65 year old obese female who was near  three years status post a knee replacement.  Patient has had severe anterior  knee pain for the past two months with the bone scan indicating increased  activity in the patella.  X-rays were nondiagnostic.  Patient was treated  conservatively due to the persistent symptoms.  She was indicated on  evaluation to have a presumed quadriceps tendon tear and evaluation of  patellar tendon and patellar component suspected, probably loosening.  Risks  and benefits were discussed, including bleeding, infection, damage to  vascular structures.  No changes in symptoms, worsening of symptoms, etc.   TECHNIQUE:  With the patient in the supine position after the induction of  adequate general anesthesia and 1 gm of Kefzol, the left lower extremity was  prepped and draped in the usual sterile fashion.  The tourniquet was not  inflated.  I made an anterior knee incision in the middle third of the  previous surgical incision.  Incised the previous scar.  The  subcutaneous  tissue was dissected.  There was abundant adipose tissue.  I identified the  patella.  The patellar ligament and quadriceps tendon insertion.  There was  a small tear of the quadriceps tendon noted.  I then performed a medial  patellar arthrotomy.  Everted the patella.  Clear fluid was evacuated and  sent for analysis with no organisms seen.  There were white blood cells.  There were significant adhesions noted of the quadriceps tendon to the  femur.  A Key elevator was utilized to previous adhesions and performed a  quadroplasty.  There were adhesions noted on the medial aspect as well.  Noted on the lateral patella of the lateral facet, there was a fracture  noted.  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  In addition, a patellar component was noted to be  loose.  This was removed without difficulty as was the residual cement.  It  was felt that the residual patella was inadequate for patellar resurfacing  for a redo recess patella; therefore, excised the nonunion  and removed any  bony spurs.  The patella was noted to slightly laterally track at that  point.  The wound was copiously irrigated with pulsatile lavage.  I examined  the femoral component and the tibial component without evidence of  loosening.  The spacer was unremarkable as well.  After pulsatile lavage  cleaning, prepared the quadriceps tendon tear with #1 Vicryl and #1 figure-  of-eight sutures.  I repaired the patellar arthrotomy and performed an  advancement of the VMO with #1 Vicryl interrupted figure-of-eight sutures.  This provided excellent tracking of the patella and flexion and extension.  No residual fracture noted.  I placed a Hemovac and brought it out through a  lateral stab wound in the skin.  Next, subcutaneous tissue was  reapproximated with 1-0 and 2-0 Vicryl simple sutures.  The skin was  reapproximated with staples.  The wound was dressed sterilely.  Marcaine  with epinephrine was placed in the joint.  I placed a  knee immobilizer.  Extubated without difficulty.  Transported to the recovery room in  satisfactory condition.   Patient tolerated the procedure well with no complications.   ASSISTANTS:  Roma Schanz, P.A., Dr. [place].       JB/MEDQ  D:  01/05/2005  T:  01/05/2005  Job:  [id number]

## 2010-10-17 NOTE — Discharge Summary (Signed)
NAMECOSIMA, PRENTISS NO.:  [id number]   MEDICAL RECORD NO.:  [id number]          PATIENT TYPE:  INP   LOCATION:  1621                         FACILITY:  [facility]   PHYSICIAN:  Jene Every, M.D.    DATE OF BIRTH:  May 06, 1946   DATE OF ADMISSION:  01/05/2005  DATE OF DISCHARGE:  01/08/2005                                 DISCHARGE SUMMARY   ADMISSION DIAGNOSES:  1.  Painful left total knee arthroplasty.  2.  Insulin dependent diabetic.  3.  Hypertension.  4.  Obesity.  5.  Hypercholesterolemia.  6.  Osteoarthritis.  7.  Depression.  8.  Gastroesophageal reflux disease.   DISCHARGE DIAGNOSES:  1.  Status post revision left total knee arthroplasty.  2.  Insulin dependent diabetes.  3.  Hypertension.  4.  Obesity.  5.  Hypercholesterolemia.  6.  Osteoarthritis.  7.  Depression.  8.  Gastroesophageal reflux disease.   PROCEDURE:  The patient was taken to the OR on January 05, 2005. She underwent  left quad repair excision of a malunion and removal of patellar component.   SURGEON:  Jene Every, M.D.   ASSISTANT:  Roma Schanz, P.A.; Madlyn Frankel. Charlann Boxer, M.D.   ANESTHESIA:  General.   COMPLICATIONS:  None.   ESTIMATED BLOOD LOSS:  30 mL.   HISTORY:  Ms. Beevers had total knee arthroplasty done in 2004 on the left.  The patient did well for quite some time and then had gradual onset of  anterior knee pain. Labs were obtained which showed no evidence of  infection. Bone scan was obtained which showed an increase uptake in the  patella only. The patient was treated conservatively with bracing.  Unfortunately she noted no relief of her symptoms. It was felt at this time  that the patient would benefit from revision of the left total knee. Risks  and benefits were discussed with the patient. She wished to proceed.   CONSULTS:  PT and OT.   LABORATORY DATA:  Preoperative CBC: White cell count 8.0, hemoglobin 12.8,  hematocrit 37.7. These were followed  throughout the hospital course. The  patient did have a rise in her white cell count at time of discharge at a  level of 14.9, hemoglobin 11.8 and hematocrit 33.8. Routine chemistries  obtained preoperatively show sodium 141, potassium 4.1 and glucose 201 with  a normal BUN and creatinine. These were followed throughout the hospital  course. The sodium remained normal at time of discharge at 136, potassium  4.0, glucose 166, BUN 14, creatinine 0.9. Preoperative urinalysis was  obtained which showed a moderate leukocyte esterase with 21-50 wbc's seen  per high-powered field. This was repeated at time of admission with trace  leukocyte esterase and 0-2 wbc's noted per high-powered field. Wound culture  was obtained intraoperatively which showed no growth. Gram-stain was  obtained which showed rare wbc's seen, no organisms noted. Anaerobic  cultures showed rare wbc's present. No organisms seen. No anaerobes  isolated. Preoperative EKG: Normal sinus rhythm. Preoperative chest x-ray:  No active cardiopulmonary disease.   HOSPITAL COURSE:  The patient  was taken to the OR for the above stated  procedures. She was then transferred to the PACU and then to the orthopedic  floor for continued postoperative care. Postoperatively one Hemovac drain  was placed. The patient was placed on PC analgesics for pain relief.  Discharged planning was initiated. Postoperatively the patient did very  well. She was slightly febrile with a temperature of 99.9, slightly elevated  white cell count 12.0, hemoglobin and hematocrit were stable. The patient  noted significant improvement in her pain. Motor and neurovascular function  remained intact. Incentive spirometer was encouraged. The patient's repeat  urinalysis came back negative. PT/OT was continued.   Postoperative day #2 the patient continued to advance well. Pain was well-  controlled with p.o. analgesics. She did complain of sinus congestion.   Preoperatively she did have a cold which she was self-medicating. Hemovac  was discontinued on postoperative day #2. The incision was clean, dry and  intact. She had no pedal edema which was unchanged from her preoperative  state. Her white cell count continued to rise with a level of 14.9,  hemoglobin remained stabilized. Leukocytosis questioned secondary to urinary  tract resolving and sinusitis. The patient was started on Claritin-D.  Discharge planning was continued.   Postoperative day #3 the patient continued to note improvement in her pain  level as well as her sinus congestion. She remained afebrile with a  temperature of 99.1. Again incision remained clean and dry. Motor and  neurovascular function was intact. It was felt at this time the patient  could be discharged home. She will continue to use her incentive spirometer  as well as Claritin-D. She will follow up with her primary care physician in  regards to her sinus congestion.   DISPOSITION:  The patient discharged home with home health needs met.   DIET:  Low carb, low cal.   ACTIVITY:  The patient should use her knee immobilizer until she can  straight leg raise. She is to elevate her lower extremity 6 times a day for  20 minutes at a time. She is to walk with assistance. She may shower in 72  hours. Okay for her to walk up steps. Dressing changes daily. She is to  advise [place] if drainage or swelling occur.   DISCHARGE MEDICATIONS:  1.  Includes all home medications.  2.  Addition of Percocet 1-2 p.o. q.4-6h. p.r.n. pain.  3.  Robaxin 500 mg 1 p.o. q.6-8h. p.r.n. spasm.  4.  Claritin-D 1 p.o. q.12h. p.r.n.  5.  Aspirin 81 mg daily.   She is to follow up with Dr. Shelle Iron in 10-14 days. She is to call for an  appointment. She should also follow up with primary care physician if fever  or continued congestion. She is to continue using her incentive spirometer  at home.   CONDITION ON DISCHARGE:  Stable.  FINAL  DIAGNOSIS:  Status post revision left total knee.      Roma Schanz, P.A.      Jene Every, M.D.  Electronically Signed    CS/MEDQ  D:  02/19/2005  T:  02/20/2005  Job:  [id number]

## 2010-10-30 ENCOUNTER — Other Ambulatory Visit (INDEPENDENT_AMBULATORY_CARE_PROVIDER_SITE_OTHER): Payer: Self-pay | Admitting: Surgery

## 2010-10-30 DIAGNOSIS — Z9884 Bariatric surgery status: Secondary | ICD-10-CM

## 2010-10-30 DIAGNOSIS — R1110 Vomiting, unspecified: Secondary | ICD-10-CM

## 2010-11-04 ENCOUNTER — Other Ambulatory Visit (INDEPENDENT_AMBULATORY_CARE_PROVIDER_SITE_OTHER): Payer: Self-pay | Admitting: Surgery

## 2010-11-04 ENCOUNTER — Ambulatory Visit (HOSPITAL_COMMUNITY)
Admission: RE | Admit: 2010-11-04 | Discharge: 2010-11-04 | Disposition: A | Discharge status: Home / Self Care | Payer: Medicare Other | Source: Ambulatory Visit | Attending: Surgery | Admitting: Surgery

## 2010-11-04 DIAGNOSIS — Z9884 Bariatric surgery status: Secondary | ICD-10-CM | POA: Insufficient documentation

## 2010-11-04 DIAGNOSIS — R198 Other specified symptoms and signs involving the digestive system and abdomen: Secondary | ICD-10-CM | POA: Insufficient documentation

## 2010-11-04 DIAGNOSIS — R1110 Vomiting, unspecified: Secondary | ICD-10-CM

## 2010-11-04 LAB — FOLATE: Folate: 17.5 ng/mL

## 2010-11-04 LAB — VITAMIN D 25 HYDROXY (VIT D DEFICIENCY, FRACTURES): Vit D, 25-Hydroxy: 51 ng/mL (ref 30–89)

## 2010-11-04 LAB — THYROID PANEL WITH TSH - CHCC
Free Thyroxine Index: 2.5 (ref 1.0–3.9)
T3 Uptake: 28.5 % (ref 22.5–37.0)
TSH: 5.787 u[IU]/mL — ABNORMAL HIGH (ref 0.350–4.500)

## 2010-11-21 ENCOUNTER — Encounter (INDEPENDENT_AMBULATORY_CARE_PROVIDER_SITE_OTHER): Payer: Self-pay | Admitting: Surgery

## 2010-11-25 ENCOUNTER — Ambulatory Visit: Payer: No Typology Code available for payment source | Admitting: *Deleted

## 2010-11-25 ENCOUNTER — Encounter: Payer: Medicare Other | Attending: Surgery | Admitting: *Deleted

## 2010-11-25 DIAGNOSIS — Z01818 Encounter for other preprocedural examination: Secondary | ICD-10-CM | POA: Insufficient documentation

## 2010-11-25 DIAGNOSIS — Z713 Dietary counseling and surveillance: Secondary | ICD-10-CM | POA: Insufficient documentation

## 2010-12-16 ENCOUNTER — Encounter: Payer: Self-pay | Admitting: Gastroenterology

## 2010-12-26 ENCOUNTER — Ambulatory Visit (INDEPENDENT_AMBULATORY_CARE_PROVIDER_SITE_OTHER): Payer: Self-pay | Admitting: Surgery

## 2011-01-07 ENCOUNTER — Encounter (INDEPENDENT_AMBULATORY_CARE_PROVIDER_SITE_OTHER): Payer: Self-pay | Admitting: Surgery

## 2011-01-07 ENCOUNTER — Encounter (INDEPENDENT_AMBULATORY_CARE_PROVIDER_SITE_OTHER): Payer: Self-pay | Admitting: General Surgery

## 2011-01-07 ENCOUNTER — Ambulatory Visit (INDEPENDENT_AMBULATORY_CARE_PROVIDER_SITE_OTHER): Payer: Medicare Other | Admitting: Surgery

## 2011-01-07 VITALS — BP 140/98 | HR 88 | Temp 95.0°F | Ht 67.0 in | Wt 278.6 lb

## 2011-01-07 DIAGNOSIS — Z9884 Bariatric surgery status: Secondary | ICD-10-CM

## 2011-01-07 NOTE — Progress Notes (Signed)
Mrs. Sharon Lowe returns today in followup today.  Her  weight is 278.6 so she has lost 54 pounds. That amounts to about 16% of her excess weight loss and her BMI is down to 43.6. 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 Her current age is 20. She sees Hal Stoneking in followup. Her endocrinologist is Dr. Sharl Ma and currently her insulin usage is down to 50 international units from 100 units preop. She no longer takes from omeprazole. She still takes simvastatin and lisinopril.  Her ventral hernia repair is intact. She is experiencing some hair loss but she looks good. I recommended that she add omega-3 fatty acids .Marland Kitchen I'll see her back in 6 months.

## 2011-03-26 ENCOUNTER — Encounter: Payer: Self-pay | Admitting: *Deleted

## 2011-03-26 ENCOUNTER — Encounter: Payer: Medicare Other | Attending: Surgery | Admitting: *Deleted

## 2011-03-26 DIAGNOSIS — Z713 Dietary counseling and surveillance: Secondary | ICD-10-CM | POA: Insufficient documentation

## 2011-03-26 DIAGNOSIS — Z09 Encounter for follow-up examination after completed treatment for conditions other than malignant neoplasm: Secondary | ICD-10-CM | POA: Insufficient documentation

## 2011-03-26 DIAGNOSIS — Z9884 Bariatric surgery status: Secondary | ICD-10-CM | POA: Insufficient documentation

## 2011-03-26 NOTE — Patient Instructions (Signed)
Goals:  Follow Phase 3B: High Protein + Non-Starchy Vegetables  Eat 3-6 small meals/snacks, every 3-5 hrs  Increase lean protein foods to meet 60-80g goal  Consume 1/2 cup OR 15 gramss of carbohydrate (fruit, whole grain, starchy vegetable) with meals  Avoid drinking 15 minutes before, during and 30 minutes after eating  Aim for >30 min of physical activity daily  Follow up for 12 month post-op visit

## 2011-03-26 NOTE — Progress Notes (Signed)
  Follow-up visit: 7 Months Post-Operative Gastric Bypass Surgery  Medical Nutrition Therapy:  Appt start time: 1505 end time:  1535.  Assessment:  Primary concerns today: post-operative bariatric surgery nutrition management.  Weight today: 259.1  Weight change: 28 lbs Total weight lost: 85.8 lbs total BMI: 40.7% Weight goal: 150-175 lbs  24-hr recall:  B (9:30-10 AM): 1/2 large bagel w/ cream cheese Snk (AM): N/A   L (12-1 PM): Leftovers from dinner OR Cheddar wurst (1) OR Canned soup Snk (3-4 PM): Protein bar OR Cheese stick  D (6-8 PM): Grilled meat (chicken, hamburger, pork) (4oz), potato, butter beans (1/2 cup food) Snk (PM): popcorn  Fluid intake: water, crystal light, unsweetened tea = 50-64 oz Estimated total protein intake: 40-50 g   Medications: See updated medication list Supplementation: Taking regularly  CBG monitoring: Daily Average CBG per patient: 115-130's (fasting), 195-290's (bedtime) Last patient reported A1c: 6.5%  (per Dr. Sharl Ma)  Using straws: No Drinking while eating: No Hair loss: No Carbonated beverages: No N/V/D/C: Loose stools reported daily; c/o dry heaves regularly as well Dumping syndrome: None reported  Recent physical activity:  Very limited activity; Lower ADL's reoprted  Progress Towards Goal(s):  In progress.  Handouts given during visit include:  Carbohydrate counting card   Nutritional Diagnosis:  NB-2.1 Physical inactivity As related to sedentary lifestyle.  As evidenced by limited activity and ADL's.    Intervention:  Nutrition eduation.  Monitoring/Evaluation:  Dietary intake, exercise, protein levels, and body weight. Follow up in 3-6 months for 9-12 month post-op visit.

## 2011-07-16 ENCOUNTER — Other Ambulatory Visit (HOSPITAL_COMMUNITY): Payer: Self-pay | Admitting: Geriatric Medicine

## 2011-07-16 DIAGNOSIS — Z1231 Encounter for screening mammogram for malignant neoplasm of breast: Secondary | ICD-10-CM

## 2011-07-24 ENCOUNTER — Encounter (INDEPENDENT_AMBULATORY_CARE_PROVIDER_SITE_OTHER): Payer: Self-pay | Admitting: Surgery

## 2011-07-24 ENCOUNTER — Ambulatory Visit (INDEPENDENT_AMBULATORY_CARE_PROVIDER_SITE_OTHER): Payer: Medicare Other | Admitting: Surgery

## 2011-07-24 DIAGNOSIS — I10 Essential (primary) hypertension: Secondary | ICD-10-CM | POA: Diagnosis not present

## 2011-07-24 DIAGNOSIS — Z9884 Bariatric surgery status: Secondary | ICD-10-CM | POA: Diagnosis not present

## 2011-07-24 DIAGNOSIS — E119 Type 2 diabetes mellitus without complications: Secondary | ICD-10-CM | POA: Diagnosis not present

## 2011-07-24 NOTE — Progress Notes (Signed)
Sharon Lowe comes in today and she is 11.3 months post root all Y. gastric bypass she has lost 27% of her excess weight or 89.8 pounds and her BMI is down from 52-38. Her diabetes of greater than 20 years he is much better controlled but she still does take insulin. Today's weight is 242.8.  She feels good. She does have some maceration of her pannus which is hanging down a lot nail. A totally good stability the summertime to call [place] him come in for a photograph so we can document because she does describe breakdown in the area of her panniculectomy or near of her pannus. I plan to see her again in one year unless she needs to see me sooner.

## 2011-08-11 ENCOUNTER — Ambulatory Visit (HOSPITAL_COMMUNITY)
Admission: RE | Admit: 2011-08-11 | Discharge: 2011-08-11 | Disposition: A | Discharge status: Home / Self Care | Payer: Medicare Other | Source: Ambulatory Visit | Attending: Geriatric Medicine | Admitting: Geriatric Medicine

## 2011-08-11 DIAGNOSIS — Z1231 Encounter for screening mammogram for malignant neoplasm of breast: Secondary | ICD-10-CM | POA: Insufficient documentation

## 2011-08-24 ENCOUNTER — Encounter: Payer: Self-pay | Admitting: *Deleted

## 2011-08-24 ENCOUNTER — Encounter: Payer: Medicare Other | Attending: Surgery | Admitting: *Deleted

## 2011-08-24 VITALS — Ht 67.5 in | Wt 242.9 lb

## 2011-08-24 DIAGNOSIS — Z713 Dietary counseling and surveillance: Secondary | ICD-10-CM | POA: Insufficient documentation

## 2011-08-24 DIAGNOSIS — Z09 Encounter for follow-up examination after completed treatment for conditions other than malignant neoplasm: Secondary | ICD-10-CM | POA: Insufficient documentation

## 2011-08-24 DIAGNOSIS — Z9884 Bariatric surgery status: Secondary | ICD-10-CM | POA: Insufficient documentation

## 2011-08-24 DIAGNOSIS — E119 Type 2 diabetes mellitus without complications: Secondary | ICD-10-CM

## 2011-08-24 NOTE — Progress Notes (Signed)
  Follow-up visit:  13 Months Post-Operative Gastric Bypass Surgery  Medical Nutrition Therapy:  Appt start time: 1530 end time:  1600.  Primary concerns today: Post-operative bariatric surgery nutrition management. Sharon Lowe returns today for follow up with an additional weight loss of 16.2 lbs; "I've hit a plateau". Reports intake of higher CHO and fat foods, though very small portions noted.  FBG range from 90s-100s and after bedtime snack in the 250s.  Discussed checking before snack.  Doing very well overall with no complaints. Requests to follow up prn.   Weight today: 242.9 lbs  Weight change: 16.2 lbs Total weight lost: 102.0 lbs total BMI: 37.5 kg/m^2 Weight goal: 150-175 lbs  24-hr recall: B (9:30-10 AM): [employer] Ham [employer] Cheese croissant or grilled cheese; "sips" of orange juice Snk (AM): Peanuts (roasted) or none L (12-1 PM): Bowl of soup or hotdog w/ white bun; sips of unsweet tea w/ splenda or occasionally carbonated drink (takes whole day to drink) Snk (3-4 PM): Peanuts OR ice cream (very small portion)  D (7 PM): Pork chop (4oz), 2 oz potato cooked in garlic and butter OR salad OR corn (1/2 cup food) Snk (PM): popcorn or none  Fluid intake: water, crystal light, unsweetened tea = 50-64 oz Estimated total protein intake: 40-50 g   Medications: See updated medication list Supplementation: Taking regularly  CBG monitoring: 2x/daily Average CBG per patient: 90-100s (fasting), 250s (bedtime - checks not long after hs snack)  Lab Results  Component Value Date   HGBA1C  Value: 6.8 (NOTE)                                          08/19/2010   Using straws: Yes Drinking while eating: Sips Hair loss: Some; "it's much better now than before" Carbonated beverages: Occasionally sips on one over a day N/V/D/C: None reported Dumping syndrome: None reported  Recent physical activity:  None at this time; Plans to return to gym as weather warms  Progress Towards Goal(s):   Resolved.  Handouts given during visit include:  Target Blood Glucose Levels   Nutritional Diagnosis:  [place]-3.3 Overweight/obesity related to previous bariatric surgery as evidenced by pt following post-op gastric bypass nutrition guidelines for continued weight loss.    Intervention:  Nutrition education/reinforcement prn.  Monitoring/Evaluation:  Dietary intake, exercise, protein levels, and body weight. Follow up in 6-12 months or prn.

## 2011-08-24 NOTE — Patient Instructions (Signed)
Goals:  Continue to follow Phase 3B: High Protein + Non-Starchy Vegetables  Eat 3-6 small meals/snacks, every 3-5 hrs  Increase lean protein foods to meet 60g goal  Increase fluid intake to 64oz +  Continue adding 15 grams of carbohydrate (fruit, whole grain, starchy vegetable) with meals  Avoid drinking 15 minutes before, during and 30 minutes after eating  Aim for >30 min of physical activity daily

## 2011-09-14 DIAGNOSIS — E1149 Type 2 diabetes mellitus with other diabetic neurological complication: Secondary | ICD-10-CM | POA: Diagnosis not present

## 2011-09-14 DIAGNOSIS — E669 Obesity, unspecified: Secondary | ICD-10-CM | POA: Diagnosis not present

## 2011-09-14 DIAGNOSIS — E1142 Type 2 diabetes mellitus with diabetic polyneuropathy: Secondary | ICD-10-CM | POA: Diagnosis not present

## 2011-10-29 DIAGNOSIS — Z79899 Other long term (current) drug therapy: Secondary | ICD-10-CM | POA: Diagnosis not present

## 2011-10-29 DIAGNOSIS — N951 Menopausal and female climacteric states: Secondary | ICD-10-CM | POA: Diagnosis not present

## 2011-10-29 DIAGNOSIS — I129 Hypertensive chronic kidney disease with stage 1 through stage 4 chronic kidney disease, or unspecified chronic kidney disease: Secondary | ICD-10-CM | POA: Diagnosis not present

## 2011-11-19 DIAGNOSIS — Z13820 Encounter for screening for osteoporosis: Secondary | ICD-10-CM | POA: Diagnosis not present

## 2011-11-19 DIAGNOSIS — N951 Menopausal and female climacteric states: Secondary | ICD-10-CM | POA: Diagnosis not present

## 2011-11-26 DIAGNOSIS — I129 Hypertensive chronic kidney disease with stage 1 through stage 4 chronic kidney disease, or unspecified chronic kidney disease: Secondary | ICD-10-CM | POA: Diagnosis not present

## 2011-12-15 DIAGNOSIS — M810 Age-related osteoporosis without current pathological fracture: Secondary | ICD-10-CM | POA: Diagnosis not present

## 2012-03-14 DIAGNOSIS — E669 Obesity, unspecified: Secondary | ICD-10-CM | POA: Diagnosis not present

## 2012-03-14 DIAGNOSIS — E1149 Type 2 diabetes mellitus with other diabetic neurological complication: Secondary | ICD-10-CM | POA: Diagnosis not present

## 2012-03-14 DIAGNOSIS — E1142 Type 2 diabetes mellitus with diabetic polyneuropathy: Secondary | ICD-10-CM | POA: Diagnosis not present

## 2012-03-14 DIAGNOSIS — Z23 Encounter for immunization: Secondary | ICD-10-CM | POA: Diagnosis not present

## 2012-03-24 DIAGNOSIS — Z79899 Other long term (current) drug therapy: Secondary | ICD-10-CM | POA: Diagnosis not present

## 2012-03-24 DIAGNOSIS — F17200 Nicotine dependence, unspecified, uncomplicated: Secondary | ICD-10-CM | POA: Diagnosis not present

## 2012-03-24 DIAGNOSIS — R011 Cardiac murmur, unspecified: Secondary | ICD-10-CM | POA: Diagnosis not present

## 2012-03-24 DIAGNOSIS — E782 Mixed hyperlipidemia: Secondary | ICD-10-CM | POA: Diagnosis not present

## 2012-03-24 DIAGNOSIS — Z0000 Encounter for general adult medical examination without abnormal findings: Secondary | ICD-10-CM | POA: Diagnosis not present

## 2012-03-24 DIAGNOSIS — E039 Hypothyroidism, unspecified: Secondary | ICD-10-CM | POA: Diagnosis not present

## 2012-03-24 DIAGNOSIS — F329 Major depressive disorder, single episode, unspecified: Secondary | ICD-10-CM | POA: Diagnosis not present

## 2012-03-25 DIAGNOSIS — E039 Hypothyroidism, unspecified: Secondary | ICD-10-CM | POA: Diagnosis not present

## 2012-03-25 DIAGNOSIS — Z79899 Other long term (current) drug therapy: Secondary | ICD-10-CM | POA: Diagnosis not present

## 2012-03-25 DIAGNOSIS — E782 Mixed hyperlipidemia: Secondary | ICD-10-CM | POA: Diagnosis not present

## 2012-04-01 DIAGNOSIS — R011 Cardiac murmur, unspecified: Secondary | ICD-10-CM | POA: Diagnosis not present

## 2012-05-06 DIAGNOSIS — F329 Major depressive disorder, single episode, unspecified: Secondary | ICD-10-CM | POA: Diagnosis not present

## 2012-05-06 DIAGNOSIS — I10 Essential (primary) hypertension: Secondary | ICD-10-CM | POA: Diagnosis not present

## 2012-05-06 DIAGNOSIS — F17200 Nicotine dependence, unspecified, uncomplicated: Secondary | ICD-10-CM | POA: Diagnosis not present

## 2012-06-16 DIAGNOSIS — F17200 Nicotine dependence, unspecified, uncomplicated: Secondary | ICD-10-CM | POA: Diagnosis not present

## 2012-06-16 DIAGNOSIS — E669 Obesity, unspecified: Secondary | ICD-10-CM | POA: Diagnosis not present

## 2012-06-16 DIAGNOSIS — E1142 Type 2 diabetes mellitus with diabetic polyneuropathy: Secondary | ICD-10-CM | POA: Diagnosis not present

## 2012-06-16 DIAGNOSIS — E1149 Type 2 diabetes mellitus with other diabetic neurological complication: Secondary | ICD-10-CM | POA: Diagnosis not present

## 2012-07-01 ENCOUNTER — Other Ambulatory Visit: Payer: Self-pay | Admitting: Geriatric Medicine

## 2012-07-01 ENCOUNTER — Ambulatory Visit
Admission: RE | Admit: 2012-07-01 | Discharge: 2012-07-01 | Disposition: A | Discharge status: Home / Self Care | Payer: Medicare Other | Source: Ambulatory Visit | Attending: Geriatric Medicine | Admitting: Geriatric Medicine

## 2012-07-01 DIAGNOSIS — N1330 Unspecified hydronephrosis: Secondary | ICD-10-CM | POA: Diagnosis not present

## 2012-07-01 DIAGNOSIS — Z79899 Other long term (current) drug therapy: Secondary | ICD-10-CM | POA: Diagnosis not present

## 2012-07-01 DIAGNOSIS — N200 Calculus of kidney: Secondary | ICD-10-CM | POA: Diagnosis not present

## 2012-07-01 DIAGNOSIS — N201 Calculus of ureter: Secondary | ICD-10-CM | POA: Diagnosis not present

## 2012-07-01 DIAGNOSIS — R109 Unspecified abdominal pain: Secondary | ICD-10-CM

## 2012-07-01 DIAGNOSIS — K8020 Calculus of gallbladder without cholecystitis without obstruction: Secondary | ICD-10-CM | POA: Diagnosis not present

## 2012-07-05 DIAGNOSIS — N201 Calculus of ureter: Secondary | ICD-10-CM | POA: Diagnosis not present

## 2012-07-05 DIAGNOSIS — R310 Gross hematuria: Secondary | ICD-10-CM | POA: Diagnosis not present

## 2012-07-11 DIAGNOSIS — E119 Type 2 diabetes mellitus without complications: Secondary | ICD-10-CM | POA: Diagnosis not present

## 2012-07-11 DIAGNOSIS — B079 Viral wart, unspecified: Secondary | ICD-10-CM | POA: Diagnosis not present

## 2012-07-11 DIAGNOSIS — Q828 Other specified congenital malformations of skin: Secondary | ICD-10-CM | POA: Diagnosis not present

## 2012-07-25 DIAGNOSIS — N200 Calculus of kidney: Secondary | ICD-10-CM | POA: Diagnosis not present

## 2012-07-25 DIAGNOSIS — K8020 Calculus of gallbladder without cholecystitis without obstruction: Secondary | ICD-10-CM | POA: Diagnosis not present

## 2012-07-25 DIAGNOSIS — R310 Gross hematuria: Secondary | ICD-10-CM | POA: Diagnosis not present

## 2012-08-03 DIAGNOSIS — N200 Calculus of kidney: Secondary | ICD-10-CM | POA: Diagnosis not present

## 2012-08-03 DIAGNOSIS — D497 Neoplasm of unspecified behavior of endocrine glands and other parts of nervous system: Secondary | ICD-10-CM | POA: Diagnosis not present

## 2012-08-09 DIAGNOSIS — I129 Hypertensive chronic kidney disease with stage 1 through stage 4 chronic kidney disease, or unspecified chronic kidney disease: Secondary | ICD-10-CM | POA: Diagnosis not present

## 2012-08-09 DIAGNOSIS — E782 Mixed hyperlipidemia: Secondary | ICD-10-CM | POA: Diagnosis not present

## 2012-08-09 DIAGNOSIS — Z79899 Other long term (current) drug therapy: Secondary | ICD-10-CM | POA: Diagnosis not present

## 2012-08-11 DIAGNOSIS — D497 Neoplasm of unspecified behavior of endocrine glands and other parts of nervous system: Secondary | ICD-10-CM | POA: Diagnosis not present

## 2012-08-23 DIAGNOSIS — E1142 Type 2 diabetes mellitus with diabetic polyneuropathy: Secondary | ICD-10-CM | POA: Diagnosis not present

## 2012-08-23 DIAGNOSIS — E669 Obesity, unspecified: Secondary | ICD-10-CM | POA: Diagnosis not present

## 2012-08-23 DIAGNOSIS — Z8639 Personal history of other endocrine, nutritional and metabolic disease: Secondary | ICD-10-CM | POA: Diagnosis not present

## 2012-08-23 DIAGNOSIS — D4410 Neoplasm of uncertain behavior of unspecified adrenal gland: Secondary | ICD-10-CM | POA: Diagnosis not present

## 2012-08-23 DIAGNOSIS — I10 Essential (primary) hypertension: Secondary | ICD-10-CM | POA: Diagnosis not present

## 2012-08-23 DIAGNOSIS — E1149 Type 2 diabetes mellitus with other diabetic neurological complication: Secondary | ICD-10-CM | POA: Diagnosis not present

## 2012-08-26 DIAGNOSIS — I10 Essential (primary) hypertension: Secondary | ICD-10-CM | POA: Diagnosis not present

## 2012-08-26 DIAGNOSIS — D4410 Neoplasm of uncertain behavior of unspecified adrenal gland: Secondary | ICD-10-CM | POA: Diagnosis not present

## 2012-09-19 ENCOUNTER — Other Ambulatory Visit (HOSPITAL_COMMUNITY): Payer: Self-pay | Admitting: Geriatric Medicine

## 2012-09-19 DIAGNOSIS — Z1231 Encounter for screening mammogram for malignant neoplasm of breast: Secondary | ICD-10-CM

## 2012-09-20 DIAGNOSIS — I10 Essential (primary) hypertension: Secondary | ICD-10-CM | POA: Diagnosis not present

## 2012-09-20 DIAGNOSIS — E782 Mixed hyperlipidemia: Secondary | ICD-10-CM | POA: Diagnosis not present

## 2012-09-20 DIAGNOSIS — E119 Type 2 diabetes mellitus without complications: Secondary | ICD-10-CM | POA: Diagnosis not present

## 2012-09-23 ENCOUNTER — Ambulatory Visit (HOSPITAL_COMMUNITY)
Admission: RE | Admit: 2012-09-23 | Discharge: 2012-09-23 | Disposition: A | Discharge status: Home / Self Care | Payer: Medicare Other | Source: Ambulatory Visit | Attending: Geriatric Medicine | Admitting: Geriatric Medicine

## 2012-09-23 DIAGNOSIS — Z1231 Encounter for screening mammogram for malignant neoplasm of breast: Secondary | ICD-10-CM | POA: Diagnosis not present

## 2012-10-03 DIAGNOSIS — D497 Neoplasm of unspecified behavior of endocrine glands and other parts of nervous system: Secondary | ICD-10-CM | POA: Diagnosis not present

## 2012-10-03 DIAGNOSIS — N200 Calculus of kidney: Secondary | ICD-10-CM | POA: Diagnosis not present

## 2012-10-05 ENCOUNTER — Other Ambulatory Visit (HOSPITAL_COMMUNITY): Payer: Self-pay | Admitting: Urology

## 2012-10-05 DIAGNOSIS — E119 Type 2 diabetes mellitus without complications: Secondary | ICD-10-CM | POA: Diagnosis not present

## 2012-10-05 DIAGNOSIS — D369 Benign neoplasm, unspecified site: Secondary | ICD-10-CM

## 2012-10-05 DIAGNOSIS — H25099 Other age-related incipient cataract, unspecified eye: Secondary | ICD-10-CM | POA: Diagnosis not present

## 2012-10-11 ENCOUNTER — Ambulatory Visit (HOSPITAL_COMMUNITY)
Admission: RE | Admit: 2012-10-11 | Discharge: 2012-10-11 | Disposition: A | Discharge status: Home / Self Care | Payer: Medicare Other | Source: Ambulatory Visit | Attending: Urology | Admitting: Urology

## 2012-10-11 DIAGNOSIS — N281 Cyst of kidney, acquired: Secondary | ICD-10-CM | POA: Diagnosis not present

## 2012-10-11 DIAGNOSIS — D3500 Benign neoplasm of unspecified adrenal gland: Secondary | ICD-10-CM | POA: Insufficient documentation

## 2012-10-11 DIAGNOSIS — K8020 Calculus of gallbladder without cholecystitis without obstruction: Secondary | ICD-10-CM | POA: Diagnosis not present

## 2012-10-11 DIAGNOSIS — Q619 Cystic kidney disease, unspecified: Secondary | ICD-10-CM | POA: Insufficient documentation

## 2012-10-11 DIAGNOSIS — D369 Benign neoplasm, unspecified site: Secondary | ICD-10-CM

## 2012-10-21 ENCOUNTER — Ambulatory Visit (INDEPENDENT_AMBULATORY_CARE_PROVIDER_SITE_OTHER): Payer: Medicare Other | Admitting: Surgery

## 2012-10-21 ENCOUNTER — Encounter (INDEPENDENT_AMBULATORY_CARE_PROVIDER_SITE_OTHER): Payer: Self-pay | Admitting: Surgery

## 2012-10-21 ENCOUNTER — Other Ambulatory Visit (HOSPITAL_COMMUNITY): Payer: Self-pay | Admitting: Urology

## 2012-10-21 VITALS — BP 142/72 | HR 72 | Temp 97.9°F | Resp 18 | Ht 67.5 in | Wt 195.6 lb

## 2012-10-21 DIAGNOSIS — Z9884 Bariatric surgery status: Secondary | ICD-10-CM

## 2012-10-21 DIAGNOSIS — D3500 Benign neoplasm of unspecified adrenal gland: Secondary | ICD-10-CM

## 2012-10-21 DIAGNOSIS — D497 Neoplasm of unspecified behavior of endocrine glands and other parts of nervous system: Secondary | ICD-10-CM

## 2012-10-21 NOTE — Progress Notes (Signed)
Sharon Lowe 67 y.o.  Body mass index is 30.17 kg/(m^2).  Patient Active Problem List   Diagnosis Date Noted  . 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 DM (diabetes mellitus) for 20 years ID 07/24/2011  . Hypertension 07/24/2011    Allergies  Allergen Reactions  . Sulfa Antibiotics     Past Surgical History  Procedure Laterality Date  . Abdominal hysterectomy  1995  . Replacement unicondylar joint knee  2004    left  . Rotator cuff repair  2004    left  . Gastric bypass  08/18/10  . Knee arthroscopy      right   Ginette Otto, MD No diagnosis found.  Doing well with 137 number weight loss. By her numbers this is 168 considering when she started losing weight before surgery. She is off her insulin only taking metformin and had had diabetes for 20 years prior to her surgery. She is being evaluated for an adrenal tumor the present time. I recommended some biotin for her hair loss otherwise she is doing very well. I will see her back in 1 year. From her prior BX that she couldn't do this and I will she's done. Today's weight is 195.6 with a BMI of 30 Matt B. Daphine Deutscher, MD, [facility] [phone] beeper [phone]  10/21/2012 5:18 PM

## 2012-10-21 NOTE — Patient Instructions (Signed)
Try biotin, protein supplements, and zinc for hair loss

## 2012-10-26 DIAGNOSIS — R935 Abnormal findings on diagnostic imaging of other abdominal regions, including retroperitoneum: Secondary | ICD-10-CM | POA: Diagnosis not present

## 2012-10-26 DIAGNOSIS — I701 Atherosclerosis of renal artery: Secondary | ICD-10-CM | POA: Diagnosis not present

## 2012-10-26 DIAGNOSIS — K8020 Calculus of gallbladder without cholecystitis without obstruction: Secondary | ICD-10-CM | POA: Diagnosis not present

## 2012-10-26 DIAGNOSIS — M5137 Other intervertebral disc degeneration, lumbosacral region: Secondary | ICD-10-CM | POA: Diagnosis not present

## 2012-10-26 DIAGNOSIS — Z9884 Bariatric surgery status: Secondary | ICD-10-CM | POA: Diagnosis not present

## 2012-10-26 DIAGNOSIS — Z90710 Acquired absence of both cervix and uterus: Secondary | ICD-10-CM | POA: Diagnosis not present

## 2012-10-26 DIAGNOSIS — I700 Atherosclerosis of aorta: Secondary | ICD-10-CM | POA: Diagnosis not present

## 2012-10-26 DIAGNOSIS — N200 Calculus of kidney: Secondary | ICD-10-CM | POA: Diagnosis not present

## 2012-10-26 DIAGNOSIS — N281 Cyst of kidney, acquired: Secondary | ICD-10-CM | POA: Diagnosis not present

## 2012-10-26 LAB — CREATININE, SERUM
Creatinine, Ser: 0.82 mg/dL (ref 0.50–1.10)
GFR calc non Af Amer: 72 mL/min — ABNORMAL LOW (ref >90)

## 2012-10-26 LAB — BUN: BUN: 16 mg/dL (ref 6–23)

## 2012-10-27 ENCOUNTER — Ambulatory Visit (HOSPITAL_COMMUNITY)
Admission: RE | Admit: 2012-10-27 | Discharge: 2012-10-27 | Disposition: A | Discharge status: Home / Self Care | Payer: Medicare Other | Source: Ambulatory Visit | Attending: Urology | Admitting: Urology

## 2012-10-27 DIAGNOSIS — K8020 Calculus of gallbladder without cholecystitis without obstruction: Secondary | ICD-10-CM | POA: Insufficient documentation

## 2012-10-27 DIAGNOSIS — R935 Abnormal findings on diagnostic imaging of other abdominal regions, including retroperitoneum: Secondary | ICD-10-CM | POA: Insufficient documentation

## 2012-10-27 DIAGNOSIS — M51379 Other intervertebral disc degeneration, lumbosacral region without mention of lumbar back pain or lower extremity pain: Secondary | ICD-10-CM | POA: Insufficient documentation

## 2012-10-27 DIAGNOSIS — N281 Cyst of kidney, acquired: Secondary | ICD-10-CM | POA: Insufficient documentation

## 2012-10-27 DIAGNOSIS — I701 Atherosclerosis of renal artery: Secondary | ICD-10-CM | POA: Insufficient documentation

## 2012-10-27 DIAGNOSIS — Z90710 Acquired absence of both cervix and uterus: Secondary | ICD-10-CM | POA: Insufficient documentation

## 2012-10-27 DIAGNOSIS — D497 Neoplasm of unspecified behavior of endocrine glands and other parts of nervous system: Secondary | ICD-10-CM

## 2012-10-27 DIAGNOSIS — Z9884 Bariatric surgery status: Secondary | ICD-10-CM | POA: Insufficient documentation

## 2012-10-27 DIAGNOSIS — M5137 Other intervertebral disc degeneration, lumbosacral region: Secondary | ICD-10-CM | POA: Insufficient documentation

## 2012-10-27 DIAGNOSIS — N200 Calculus of kidney: Secondary | ICD-10-CM | POA: Diagnosis not present

## 2012-10-27 DIAGNOSIS — I700 Atherosclerosis of aorta: Secondary | ICD-10-CM | POA: Insufficient documentation

## 2012-10-27 DIAGNOSIS — D3500 Benign neoplasm of unspecified adrenal gland: Secondary | ICD-10-CM

## 2012-10-27 MED ORDER — IOHEXOL 300 MG/ML  SOLN
100.0000 mL | Freq: Once | INTRAMUSCULAR | Status: AC | PRN
  Administered 2012-10-27: 100 mL via INTRAVENOUS

## 2012-10-28 DIAGNOSIS — Z79899 Other long term (current) drug therapy: Secondary | ICD-10-CM | POA: Diagnosis not present

## 2012-11-13 DIAGNOSIS — E249 Cushing's syndrome, unspecified: Secondary | ICD-10-CM | POA: Diagnosis not present

## 2012-11-14 DIAGNOSIS — E249 Cushing's syndrome, unspecified: Secondary | ICD-10-CM | POA: Diagnosis not present

## 2012-11-23 DIAGNOSIS — I10 Essential (primary) hypertension: Secondary | ICD-10-CM | POA: Diagnosis not present

## 2012-11-23 DIAGNOSIS — E249 Cushing's syndrome, unspecified: Secondary | ICD-10-CM | POA: Diagnosis not present

## 2012-11-23 DIAGNOSIS — Z862 Personal history of diseases of the blood and blood-forming organs and certain disorders involving the immune mechanism: Secondary | ICD-10-CM | POA: Diagnosis not present

## 2012-11-23 DIAGNOSIS — E1149 Type 2 diabetes mellitus with other diabetic neurological complication: Secondary | ICD-10-CM | POA: Diagnosis not present

## 2012-11-23 DIAGNOSIS — E1142 Type 2 diabetes mellitus with diabetic polyneuropathy: Secondary | ICD-10-CM | POA: Diagnosis not present

## 2012-11-23 DIAGNOSIS — E669 Obesity, unspecified: Secondary | ICD-10-CM | POA: Diagnosis not present

## 2012-12-28 DIAGNOSIS — E270 Other adrenocortical overactivity: Secondary | ICD-10-CM | POA: Diagnosis not present

## 2012-12-28 DIAGNOSIS — E278 Other specified disorders of adrenal gland: Secondary | ICD-10-CM | POA: Diagnosis not present

## 2013-01-24 DIAGNOSIS — Z862 Personal history of diseases of the blood and blood-forming organs and certain disorders involving the immune mechanism: Secondary | ICD-10-CM | POA: Diagnosis not present

## 2013-01-24 DIAGNOSIS — E669 Obesity, unspecified: Secondary | ICD-10-CM | POA: Diagnosis not present

## 2013-01-24 DIAGNOSIS — I10 Essential (primary) hypertension: Secondary | ICD-10-CM | POA: Diagnosis not present

## 2013-01-24 DIAGNOSIS — R6889 Other general symptoms and signs: Secondary | ICD-10-CM | POA: Diagnosis not present

## 2013-01-24 DIAGNOSIS — E039 Hypothyroidism, unspecified: Secondary | ICD-10-CM | POA: Diagnosis not present

## 2013-01-24 DIAGNOSIS — E1142 Type 2 diabetes mellitus with diabetic polyneuropathy: Secondary | ICD-10-CM | POA: Diagnosis not present

## 2013-01-24 DIAGNOSIS — E1149 Type 2 diabetes mellitus with other diabetic neurological complication: Secondary | ICD-10-CM | POA: Diagnosis not present

## 2013-03-20 DIAGNOSIS — E1149 Type 2 diabetes mellitus with other diabetic neurological complication: Secondary | ICD-10-CM | POA: Diagnosis not present

## 2013-03-20 DIAGNOSIS — E1142 Type 2 diabetes mellitus with diabetic polyneuropathy: Secondary | ICD-10-CM | POA: Diagnosis not present

## 2013-03-20 DIAGNOSIS — E039 Hypothyroidism, unspecified: Secondary | ICD-10-CM | POA: Diagnosis not present

## 2013-03-20 DIAGNOSIS — Z79899 Other long term (current) drug therapy: Secondary | ICD-10-CM | POA: Diagnosis not present

## 2013-03-20 DIAGNOSIS — E249 Cushing's syndrome, unspecified: Secondary | ICD-10-CM | POA: Diagnosis not present

## 2013-03-27 DIAGNOSIS — I10 Essential (primary) hypertension: Secondary | ICD-10-CM | POA: Diagnosis not present

## 2013-03-27 DIAGNOSIS — E669 Obesity, unspecified: Secondary | ICD-10-CM | POA: Diagnosis not present

## 2013-03-27 DIAGNOSIS — Z23 Encounter for immunization: Secondary | ICD-10-CM | POA: Diagnosis not present

## 2013-03-27 DIAGNOSIS — E1142 Type 2 diabetes mellitus with diabetic polyneuropathy: Secondary | ICD-10-CM | POA: Diagnosis not present

## 2013-03-27 DIAGNOSIS — Z1331 Encounter for screening for depression: Secondary | ICD-10-CM | POA: Diagnosis not present

## 2013-03-27 DIAGNOSIS — Z0000 Encounter for general adult medical examination without abnormal findings: Secondary | ICD-10-CM | POA: Diagnosis not present

## 2013-03-27 DIAGNOSIS — R6889 Other general symptoms and signs: Secondary | ICD-10-CM | POA: Diagnosis not present

## 2013-03-27 DIAGNOSIS — E1149 Type 2 diabetes mellitus with other diabetic neurological complication: Secondary | ICD-10-CM | POA: Diagnosis not present

## 2013-03-27 DIAGNOSIS — E039 Hypothyroidism, unspecified: Secondary | ICD-10-CM | POA: Diagnosis not present

## 2013-03-27 DIAGNOSIS — M25569 Pain in unspecified knee: Secondary | ICD-10-CM | POA: Diagnosis not present

## 2013-04-03 DIAGNOSIS — E278 Other specified disorders of adrenal gland: Secondary | ICD-10-CM | POA: Diagnosis not present

## 2013-04-05 DIAGNOSIS — E7800 Pure hypercholesterolemia, unspecified: Secondary | ICD-10-CM | POA: Diagnosis not present

## 2013-04-05 DIAGNOSIS — Z79899 Other long term (current) drug therapy: Secondary | ICD-10-CM | POA: Diagnosis not present

## 2013-04-10 DIAGNOSIS — E278 Other specified disorders of adrenal gland: Secondary | ICD-10-CM | POA: Diagnosis not present

## 2013-04-12 DIAGNOSIS — M25569 Pain in unspecified knee: Secondary | ICD-10-CM | POA: Diagnosis not present

## 2013-04-12 DIAGNOSIS — Z471 Aftercare following joint replacement surgery: Secondary | ICD-10-CM | POA: Diagnosis not present

## 2013-05-02 DIAGNOSIS — M25569 Pain in unspecified knee: Secondary | ICD-10-CM | POA: Diagnosis not present

## 2013-05-03 DIAGNOSIS — E782 Mixed hyperlipidemia: Secondary | ICD-10-CM | POA: Diagnosis not present

## 2013-05-03 DIAGNOSIS — Z79899 Other long term (current) drug therapy: Secondary | ICD-10-CM | POA: Diagnosis not present

## 2013-05-16 DIAGNOSIS — M25569 Pain in unspecified knee: Secondary | ICD-10-CM | POA: Diagnosis not present

## 2013-05-16 DIAGNOSIS — Z471 Aftercare following joint replacement surgery: Secondary | ICD-10-CM | POA: Diagnosis not present

## 2013-05-16 DIAGNOSIS — M7650 Patellar tendinitis, unspecified knee: Secondary | ICD-10-CM | POA: Diagnosis not present

## 2013-05-16 DIAGNOSIS — M1710 Unilateral primary osteoarthritis, unspecified knee: Secondary | ICD-10-CM | POA: Diagnosis not present

## 2013-06-01 DIAGNOSIS — Z87442 Personal history of urinary calculi: Secondary | ICD-10-CM

## 2013-06-01 HISTORY — DX: Personal history of urinary calculi: Z87.442

## 2013-06-01 HISTORY — PX: CATARACT EXTRACTION: SUR2

## 2013-06-28 DIAGNOSIS — M1710 Unilateral primary osteoarthritis, unspecified knee: Secondary | ICD-10-CM | POA: Diagnosis not present

## 2013-06-28 DIAGNOSIS — M7650 Patellar tendinitis, unspecified knee: Secondary | ICD-10-CM | POA: Diagnosis not present

## 2013-06-28 DIAGNOSIS — IMO0002 Reserved for concepts with insufficient information to code with codable children: Secondary | ICD-10-CM | POA: Diagnosis not present

## 2013-07-05 DIAGNOSIS — E7800 Pure hypercholesterolemia, unspecified: Secondary | ICD-10-CM | POA: Diagnosis not present

## 2013-07-05 DIAGNOSIS — Z79899 Other long term (current) drug therapy: Secondary | ICD-10-CM | POA: Diagnosis not present

## 2013-07-24 DIAGNOSIS — I10 Essential (primary) hypertension: Secondary | ICD-10-CM | POA: Diagnosis not present

## 2013-07-24 DIAGNOSIS — K117 Disturbances of salivary secretion: Secondary | ICD-10-CM | POA: Diagnosis not present

## 2013-08-09 ENCOUNTER — Other Ambulatory Visit (HOSPITAL_COMMUNITY): Payer: Self-pay | Admitting: Sports Medicine

## 2013-08-09 DIAGNOSIS — M25562 Pain in left knee: Secondary | ICD-10-CM

## 2013-08-16 ENCOUNTER — Encounter (HOSPITAL_COMMUNITY)
Admission: RE | Admit: 2013-08-16 | Discharge: 2013-08-16 | Disposition: A | Discharge status: Home / Self Care | Payer: Medicare Other | Source: Ambulatory Visit | Attending: Sports Medicine | Admitting: Sports Medicine

## 2013-08-16 ENCOUNTER — Ambulatory Visit (HOSPITAL_COMMUNITY)
Admission: RE | Admit: 2013-08-16 | Discharge: 2013-08-16 | Disposition: A | Discharge status: Home / Self Care | Payer: Medicare Other | Source: Ambulatory Visit | Attending: Sports Medicine | Admitting: Sports Medicine

## 2013-08-16 DIAGNOSIS — M25562 Pain in left knee: Secondary | ICD-10-CM

## 2013-08-16 DIAGNOSIS — Z96659 Presence of unspecified artificial knee joint: Secondary | ICD-10-CM | POA: Diagnosis not present

## 2013-08-16 DIAGNOSIS — M25569 Pain in unspecified knee: Secondary | ICD-10-CM | POA: Insufficient documentation

## 2013-08-16 DIAGNOSIS — Z471 Aftercare following joint replacement surgery: Secondary | ICD-10-CM | POA: Diagnosis not present

## 2013-08-16 MED ORDER — TECHNETIUM TC 99M MEDRONATE IV KIT
25.0000 | PACK | Freq: Once | INTRAVENOUS | Status: AC | PRN
  Administered 2013-08-16: 25 via INTRAVENOUS

## 2013-08-21 DIAGNOSIS — Z79899 Other long term (current) drug therapy: Secondary | ICD-10-CM | POA: Diagnosis not present

## 2013-08-21 DIAGNOSIS — I10 Essential (primary) hypertension: Secondary | ICD-10-CM | POA: Diagnosis not present

## 2013-08-24 ENCOUNTER — Other Ambulatory Visit (HOSPITAL_COMMUNITY): Payer: Self-pay | Admitting: Geriatric Medicine

## 2013-08-24 DIAGNOSIS — Z1231 Encounter for screening mammogram for malignant neoplasm of breast: Secondary | ICD-10-CM

## 2013-09-25 DIAGNOSIS — E1149 Type 2 diabetes mellitus with other diabetic neurological complication: Secondary | ICD-10-CM | POA: Diagnosis not present

## 2013-09-26 ENCOUNTER — Ambulatory Visit (HOSPITAL_COMMUNITY)
Admission: RE | Admit: 2013-09-26 | Discharge: 2013-09-26 | Disposition: A | Discharge status: Home / Self Care | Payer: Medicare Other | Source: Ambulatory Visit | Attending: Geriatric Medicine | Admitting: Geriatric Medicine

## 2013-09-26 ENCOUNTER — Encounter (INDEPENDENT_AMBULATORY_CARE_PROVIDER_SITE_OTHER): Payer: Self-pay

## 2013-09-26 DIAGNOSIS — Z1231 Encounter for screening mammogram for malignant neoplasm of breast: Secondary | ICD-10-CM

## 2013-09-27 DIAGNOSIS — IMO0002 Reserved for concepts with insufficient information to code with codable children: Secondary | ICD-10-CM | POA: Diagnosis not present

## 2013-09-27 DIAGNOSIS — M1710 Unilateral primary osteoarthritis, unspecified knee: Secondary | ICD-10-CM | POA: Diagnosis not present

## 2013-09-28 DIAGNOSIS — E039 Hypothyroidism, unspecified: Secondary | ICD-10-CM | POA: Diagnosis not present

## 2013-09-28 DIAGNOSIS — E1142 Type 2 diabetes mellitus with diabetic polyneuropathy: Secondary | ICD-10-CM | POA: Diagnosis not present

## 2013-09-28 DIAGNOSIS — E1149 Type 2 diabetes mellitus with other diabetic neurological complication: Secondary | ICD-10-CM | POA: Diagnosis not present

## 2013-09-28 DIAGNOSIS — Z6829 Body mass index (BMI) 29.0-29.9, adult: Secondary | ICD-10-CM | POA: Diagnosis not present

## 2013-09-28 DIAGNOSIS — E663 Overweight: Secondary | ICD-10-CM | POA: Diagnosis not present

## 2013-09-28 DIAGNOSIS — I10 Essential (primary) hypertension: Secondary | ICD-10-CM | POA: Diagnosis not present

## 2013-09-28 DIAGNOSIS — E279 Disorder of adrenal gland, unspecified: Secondary | ICD-10-CM | POA: Diagnosis not present

## 2013-10-04 DIAGNOSIS — R9431 Abnormal electrocardiogram [ECG] [EKG]: Secondary | ICD-10-CM | POA: Diagnosis not present

## 2013-10-04 DIAGNOSIS — R5381 Other malaise: Secondary | ICD-10-CM | POA: Diagnosis not present

## 2013-10-04 DIAGNOSIS — R5383 Other fatigue: Secondary | ICD-10-CM | POA: Diagnosis not present

## 2013-10-04 DIAGNOSIS — R350 Frequency of micturition: Secondary | ICD-10-CM | POA: Diagnosis not present

## 2013-10-17 ENCOUNTER — Encounter (HOSPITAL_COMMUNITY): Payer: Self-pay | Admitting: Pharmacy Technician

## 2013-10-18 DIAGNOSIS — D485 Neoplasm of uncertain behavior of skin: Secondary | ICD-10-CM | POA: Diagnosis not present

## 2013-10-18 DIAGNOSIS — D235 Other benign neoplasm of skin of trunk: Secondary | ICD-10-CM | POA: Diagnosis not present

## 2013-10-18 DIAGNOSIS — L821 Other seborrheic keratosis: Secondary | ICD-10-CM | POA: Diagnosis not present

## 2013-10-18 NOTE — H&P (Signed)
Sharon Lowe is an 68 y.o. female.    Chief Complaint:       Failed left patella, S/P total knee arthroplasty  Procedure: Revision of patella or partial vs total patellectomy of the left knee  HPI: Pt is a 68 y.o. female complaining of left knee pain for since original TKA of the left knee.  Dr. Tonita Cong did a left total knee arthroplasty in 2004 and a subsequent surgery to remove the patella button in 2006.  Pain had continually increased since the beginning. X-rays in the clinic show a previous TKA with the patella component laterally displaced. Pt has tried various conservative treatments which have failed to alleviate their symptoms, including revision surgery, weight loss analgesic medications, PT and activity modification. Various options are discussed with the patient. Risks, benefits and expectations were discussed with the patient.  Risks including but not limited to the risk of anesthesia, blood clots, nerve damage, blood vessel damage, failure of the prosthesis, infection and up to and including death.  Patient understand the risks, benefits and expectations and wishes to proceed with surgery.    PCP:  Mathews Argyle, MD  D/C Plans:     SNF  Post-op Meds:    No Rx given  Tranexamic Acid:      Not to be given - previous TIA  Decadron:   Not to be given - DM  PMH: Past Medical History  Diagnosis Date  . Diabetes mellitus   . Hypertension   . Hyperlipidemia   . Substance abuse   . Thyroid disease   . GERD (gastroesophageal reflux disease)     PSH: Past Surgical History  Procedure Laterality Date  . Abdominal hysterectomy  1995  . Replacement unicondylar joint knee  2004    left  . Rotator cuff repair  2004    left  . Gastric bypass  08/18/10  . Knee arthroscopy      right    Social History:  reports that she has been smoking.  She does not have any smokeless tobacco history on file. She reports that she does not drink alcohol or use illicit  drugs.  Allergies:  Allergies  Allergen Reactions  . Sulfa Antibiotics Itching and Rash    Medications: No current facility-administered medications for this encounter.   Current Outpatient Prescriptions  Medication Sig Dispense Refill  . acetaminophen (TYLENOL) 500 MG tablet Take 1,000 mg by mouth every 6 (six) hours as needed for moderate pain.      Marland Kitchen aspirin EC 81 MG tablet Take 81 mg by mouth every evening.      Marland Kitchen atorvastatin (LIPITOR) 10 MG tablet Take 10 mg by mouth every evening.      Marland Kitchen buPROPion (WELLBUTRIN SR) 100 MG 12 hr tablet Take 100 mg by mouth every morning.      Marland Kitchen CALCIUM-VITAMIN D PO Take 1,200 mg by mouth daily.       . Cyanocobalamin (VITAMIN B-12) 2500 MCG SUBL Place 2,500 mcg under the tongue daily.      . hydrochlorothiazide (MICROZIDE) 12.5 MG capsule Take 12.5 mg by mouth every morning.      Marland Kitchen levothyroxine (SYNTHROID, LEVOTHROID) 88 MCG tablet Take 88 mcg by mouth daily before breakfast.       . lisinopril (PRINIVIL,ZESTRIL) 10 MG tablet Take 10 mg by mouth every morning.       . loratadine (ALLERGY) 10 MG tablet Take 10 mg by mouth daily.        Marland Kitchen  metFORMIN (GLUCOPHAGE) 500 MG tablet Take 500 mg by mouth 2 (two) times daily with a meal.      . Multiple Vitamin (MULTIVITAMIN WITH MINERALS) TABS tablet Take 1 tablet by mouth daily.      . Omega-3 Fatty Acids (OMEGA-3 FISH OIL PO) Take 1 capsule by mouth daily.      . Soft Lens Products (VISINE FOR CONTACTS) SOLN Place 4 drops into both eyes 3 (three) times daily as needed (dry eyes).         Review of Systems  Constitutional: Negative.   HENT: Negative.   Eyes: Negative.   Respiratory: Negative.   Cardiovascular: Negative.   Gastrointestinal: Positive for heartburn.  Genitourinary: Negative.   Musculoskeletal: Positive for joint pain.  Skin: Negative.   Neurological: Negative.   Endo/Heme/Allergies: Negative.   Psychiatric/Behavioral: Negative.       Physical Exam  Constitutional: She is  oriented to person, place, and time. She appears well-developed and well-nourished.  HENT:  Head: Normocephalic and atraumatic.  Mouth/Throat: Oropharynx is clear and moist.  Eyes: Pupils are equal, round, and reactive to light.  Neck: Neck supple. No JVD present. No tracheal deviation present. No thyromegaly present.  Cardiovascular: Normal rate, regular rhythm, normal heart sounds and intact distal pulses.   Respiratory: Effort normal and breath sounds normal. No stridor. No respiratory distress. She has no wheezes.  GI: Soft. There is no tenderness. There is no guarding.  Musculoskeletal:       Left knee: She exhibits decreased range of motion, swelling, laceration (healed), abnormal patellar mobility and bony tenderness. She exhibits no ecchymosis and no deformity. Tenderness found.  Lymphadenopathy:    She has no cervical adenopathy.  Neurological: She is alert and oriented to person, place, and time.  Skin: Skin is warm and dry.  Psychiatric: She has a normal mood and affect.     Assessment/Plan Assessment:     Failed left patella, S/P total knee arthroplasty   Plan: Patient will undergo a revision of patella or partial vs total patellectomy of the left knee on 10/30/2013 per Dr. Alvan Dame at [facility]. Risks benefits and expectations were discussed with the patient. Patient understand risks, benefits and expectations and wishes to proceed.   [place] Pugh Deja Pisarski   PAC  10/18/2013, 11:17 AM

## 2013-10-20 DIAGNOSIS — N200 Calculus of kidney: Secondary | ICD-10-CM | POA: Diagnosis not present

## 2013-10-20 DIAGNOSIS — R82998 Other abnormal findings in urine: Secondary | ICD-10-CM | POA: Diagnosis not present

## 2013-10-20 DIAGNOSIS — N281 Cyst of kidney, acquired: Secondary | ICD-10-CM | POA: Diagnosis not present

## 2013-10-20 NOTE — Patient Instructions (Signed)
Sharon Lowe  10/20/2013   Your procedure is scheduled on:  10/30/2013  320pm-450pm  Report to [facility] at    1220pm  Call this number if you have problems the morning of surgery: [phone]   Remember:   Do not eat food asfter midnite.               May have clear liquids until 0830am then npo.    Take these medicines the morning of surgery with A SIP OF WATER:    Do not wear jewelry, make-up or nail polish.  Do not wear lotions, powders, or perfumes.   Do not shave 48 hours prior to surgery.  Do not bring valuables to the hospital.  Contacts, dentures or bridgework may not be worn into surgery.  Leave suitcase in the car. After surgery it may be brought to your room.  For patients admitted to the hospital, checkout time is 11:00 AM the day of  discharge.   [facility] - Preparing for Surgery Before surgery, you can play an important role.  Because skin is not sterile, your skin needs to be as free of germs as possible.  You can reduce the number of germs on your skin by washing with CHG (chlorahexidine gluconate) soap before surgery.  CHG is an antiseptic cleaner which kills germs and bonds with the skin to continue killing germs even after washing. Please DO NOT use if you have an allergy to CHG or antibacterial soaps.  If your skin becomes reddened/irritated stop using the CHG and inform your nurse when you arrive at Short Stay. Do not shave (including legs and underarms) for at least 48 hours prior to the first CHG shower.  You may shave your face/neck. Please follow these instructions carefully:  1.  Shower with CHG Soap the night before surgery and the  morning of Surgery.  2.  If you choose to wash your hair, wash your hair first as usual with your  normal  shampoo.  3.  After you shampoo, rinse your hair and body thoroughly to remove the  shampoo.                           4.  Use CHG as you would any other liquid soap.  You can apply chg directly  to the skin  and wash                       Gently with a scrungie or clean washcloth.  5.  Apply the CHG Soap to your body ONLY FROM THE NECK DOWN.   Do not use on face/ open                           Wound or open sores. Avoid contact with eyes, ears mouth and genitals (private parts).                       Wash face,  Genitals (private parts) with your normal soap.             6.  Wash thoroughly, paying special attention to the area where your surgery  will be performed.  7.  Thoroughly rinse your body with warm water from the neck down.  8.  DO NOT shower/wash with your normal soap after using and rinsing off  the CHG Soap.  9.  Pat yourself dry with a clean towel.            10.  Wear clean pajamas.            11.  Place clean sheets on your bed the night of your first shower and do not  sleep with pets. Day of Surgery : Do not apply any lotions/deodorants the morning of surgery.  Please wear clean clothes to the hospital/surgery center.  FAILURE TO FOLLOW THESE INSTRUCTIONS MAY RESULT IN THE CANCELLATION OF YOUR SURGERY PATIENT SIGNATURE_________________________________  NURSE SIGNATURE__________________________________  ________________________________________________________________________    CLEAR LIQUID DIET   Foods Allowed                                                                     Foods Excluded  Coffee and tea, regular and decaf                             liquids that you cannot  Plain Jell-O in any flavor                                             see through such as: Fruit ices (not with fruit pulp)                                     milk, soups, orange juice  Iced Popsicles                                    All solid food Carbonated beverages, regular and diet                                    Cranberry, grape and apple juices Sports drinks like Gatorade Lightly seasoned clear broth or consume(fat free) Sugar, honey syrup  Sample Menu Breakfast                                 Lunch                                     Supper Cranberry juice                    Beef broth                            Chicken broth Jell-O                                     Grape juice  Apple juice Coffee or tea                        Jell-O                                      Popsicle                                                Coffee or tea                        Coffee or tea  _____________________________________________________________________   WHAT IS A BLOOD TRANSFUSION? Blood Transfusion Information  A transfusion is the replacement of blood or some of its parts. Blood is made up of multiple cells which provide different functions.  Red blood cells carry oxygen and are used for blood loss replacement.  White blood cells fight against infection.  Platelets control bleeding.  Plasma helps clot blood.  Other blood products are available for specialized needs, such as hemophilia or other clotting disorders. BEFORE THE TRANSFUSION  Who gives blood for transfusions?   Healthy volunteers who are fully evaluated to make sure their blood is safe. This is blood bank blood. Transfusion therapy is the safest it has ever been in the practice of medicine. Before blood is taken from a donor, a complete history is taken to make sure that person has no history of diseases nor engages in risky social behavior (examples are intravenous drug use or sexual activity with multiple partners). The donor's travel history is screened to minimize risk of transmitting infections, such as malaria. The donated blood is tested for signs of infectious diseases, such as HIV and hepatitis. The blood is then tested to be sure it is compatible with you in order to minimize the chance of a transfusion reaction. If you or a relative donates blood, this is often done in anticipation of surgery and is not appropriate for emergency situations. It takes many  days to process the donated blood. RISKS AND COMPLICATIONS Although transfusion therapy is very safe and saves many lives, the main dangers of transfusion include:   Getting an infectious disease.  Developing a transfusion reaction. This is an allergic reaction to something in the blood you were given. Every precaution is taken to prevent this. The decision to have a blood transfusion has been considered carefully by your caregiver before blood is given. Blood is not given unless the benefits outweigh the risks. AFTER THE TRANSFUSION  Right after receiving a blood transfusion, you will usually feel much better and more energetic. This is especially true if your red blood cells have gotten low (anemic). The transfusion raises the level of the red blood cells which carry oxygen, and this usually causes an energy increase.  The nurse administering the transfusion will monitor you carefully for complications. HOME CARE INSTRUCTIONS  No special instructions are needed after a transfusion. You may find your energy is better. Speak with your caregiver about any limitations on activity for underlying diseases you may have. SEEK MEDICAL CARE IF:   Your condition is not improving after your transfusion.  You develop redness or irritation at the intravenous (IV) site. SEEK IMMEDIATE MEDICAL CARE IF:  Any of  the following symptoms occur over the next 12 hours:  Shaking chills.  You have a temperature by mouth above 102 F (38.9 C), not controlled by medicine.  Chest, back, or muscle pain.  People around you feel you are not acting correctly or are confused.  Shortness of breath or difficulty breathing.  Dizziness and fainting.  You get a rash or develop hives.  You have a decrease in urine output.  Your urine turns a dark color or changes to pink, red, or brown. Any of the following symptoms occur over the next 10 days:  You have a temperature by mouth above 102 F (38.9 C), not  controlled by medicine.  Shortness of breath.  Weakness after normal activity.  The white part of the eye turns yellow (jaundice).  You have a decrease in the amount of urine or are urinating less often.  Your urine turns a dark color or changes to pink, red, or brown. Document Released: 05/15/2000 Document Revised: 08/10/2011 Document Reviewed: 01/02/2008 ExitCare Patient Information 2014 [place].  _______________________________________________________________________  Incentive Spirometer  An incentive spirometer is a tool that can help keep your lungs clear and active. This tool measures how well you are filling your lungs with each breath. Taking long deep breaths may help reverse or decrease the chance of developing breathing (pulmonary) problems (especially infection) following:  A long period of time when you are unable to move or be active. BEFORE THE PROCEDURE   If the spirometer includes an indicator to show your best effort, your nurse or respiratory therapist will set it to a desired goal.  If possible, sit up straight or lean slightly forward. Try not to slouch.  Hold the incentive spirometer in an upright position. INSTRUCTIONS FOR USE  1. Sit on the edge of your bed if possible, or sit up as far as you can in bed or on a chair. 2. Hold the incentive spirometer in an upright position. 3. Breathe out normally. 4. Place the mouthpiece in your mouth and seal your lips tightly around it. 5. Breathe in slowly and as deeply as possible, raising the piston or the ball toward the top of the column. 6. Hold your breath for 3-5 seconds or for as long as possible. Allow the piston or ball to fall to the bottom of the column. 7. Remove the mouthpiece from your mouth and breathe out normally. 8. Rest for a few seconds and repeat Steps 1 through 7 at least 10 times every 1-2 hours when you are awake. Take your time and take a few normal breaths between deep  breaths. 9. The spirometer may include an indicator to show your best effort. Use the indicator as a goal to work toward during each repetition. 10. After each set of 10 deep breaths, practice coughing to be sure your lungs are clear. If you have an incision (the cut made at the time of surgery), support your incision when coughing by placing a pillow or rolled up towels firmly against it. Once you are able to get out of bed, walk around indoors and cough well. You may stop using the incentive spirometer when instructed by your caregiver.  RISKS AND COMPLICATIONS  Take your time so you do not get dizzy or light-headed.  If you are in pain, you may need to take or ask for pain medication before doing incentive spirometry. It is harder to take a deep breath if you are having pain. AFTER USE  Rest and breathe slowly  and easily.  It can be helpful to keep track of a log of your progress. Your caregiver can provide you with a simple table to help with this. If you are using the spirometer at home, follow these instructions: [place] IF:   You are having difficultly using the spirometer.  You have trouble using the spirometer as often as instructed.  Your pain medication is not giving enough relief while using the spirometer.  You develop fever of 100.5 F (38.1 C) or higher. SEEK IMMEDIATE MEDICAL CARE IF:   You cough up bloody sputum that had not been present before.  You develop fever of 102 F (38.9 C) or greater.  You develop worsening pain at or near the incision site. MAKE SURE YOU:   Understand these instructions.  Will watch your condition.  Will get help right away if you are not doing well or get worse. Document Released: 09/28/2006 Document Revised: 08/10/2011 Document Reviewed: 11/29/2006 ExitCare Patient Information 2014 Exit[employer], [place].   ________________________________________________________________________    Please read over the following fact  sheets that you were given: MRSA Information, coughing and deep breathing exercises, leg exercises

## 2013-10-21 DIAGNOSIS — R079 Chest pain, unspecified: Secondary | ICD-10-CM | POA: Diagnosis not present

## 2013-10-22 ENCOUNTER — Encounter (HOSPITAL_COMMUNITY): Payer: Self-pay | Admitting: Emergency Medicine

## 2013-10-22 ENCOUNTER — Other Ambulatory Visit: Payer: Self-pay

## 2013-10-22 ENCOUNTER — Emergency Department (HOSPITAL_COMMUNITY): Payer: Medicare Other

## 2013-10-22 ENCOUNTER — Observation Stay (HOSPITAL_COMMUNITY)
Admission: EM | Admit: 2013-10-22 | Discharge: 2013-10-23 | Disposition: A | Discharge status: Home / Self Care | Payer: Medicare Other | Attending: Internal Medicine | Admitting: Internal Medicine

## 2013-10-22 DIAGNOSIS — E119 Type 2 diabetes mellitus without complications: Secondary | ICD-10-CM | POA: Diagnosis not present

## 2013-10-22 DIAGNOSIS — R079 Chest pain, unspecified: Principal | ICD-10-CM

## 2013-10-22 DIAGNOSIS — F17200 Nicotine dependence, unspecified, uncomplicated: Secondary | ICD-10-CM | POA: Diagnosis not present

## 2013-10-22 DIAGNOSIS — Z79899 Other long term (current) drug therapy: Secondary | ICD-10-CM | POA: Diagnosis not present

## 2013-10-22 DIAGNOSIS — Z9884 Bariatric surgery status: Secondary | ICD-10-CM

## 2013-10-22 DIAGNOSIS — R0602 Shortness of breath: Secondary | ICD-10-CM | POA: Diagnosis not present

## 2013-10-22 DIAGNOSIS — R071 Chest pain on breathing: Secondary | ICD-10-CM | POA: Diagnosis not present

## 2013-10-22 DIAGNOSIS — Z8719 Personal history of other diseases of the digestive system: Secondary | ICD-10-CM | POA: Diagnosis not present

## 2013-10-22 DIAGNOSIS — Z882 Allergy status to sulfonamides status: Secondary | ICD-10-CM | POA: Insufficient documentation

## 2013-10-22 DIAGNOSIS — E785 Hyperlipidemia, unspecified: Secondary | ICD-10-CM

## 2013-10-22 DIAGNOSIS — I369 Nonrheumatic tricuspid valve disorder, unspecified: Secondary | ICD-10-CM

## 2013-10-22 DIAGNOSIS — I10 Essential (primary) hypertension: Secondary | ICD-10-CM

## 2013-10-22 DIAGNOSIS — E079 Disorder of thyroid, unspecified: Secondary | ICD-10-CM | POA: Insufficient documentation

## 2013-10-22 DIAGNOSIS — Z720 Tobacco use: Secondary | ICD-10-CM

## 2013-10-22 DIAGNOSIS — D72829 Elevated white blood cell count, unspecified: Secondary | ICD-10-CM

## 2013-10-22 DIAGNOSIS — Z7982 Long term (current) use of aspirin: Secondary | ICD-10-CM | POA: Insufficient documentation

## 2013-10-22 LAB — BASIC METABOLIC PANEL
BUN: 24 mg/dL — AB (ref 6–23)
CO2: 25 mEq/L (ref 19–32)
CREATININE: 0.78 mg/dL (ref 0.50–1.10)
Calcium: 9.2 mg/dL (ref 8.4–10.5)
Chloride: 94 mEq/L — ABNORMAL LOW (ref 96–112)
GFR, EST NON AFRICAN AMERICAN: 84 mL/min — AB (ref >90)
Glucose, Bld: 118 mg/dL — ABNORMAL HIGH (ref 70–99)
Potassium: 4.2 mEq/L (ref 3.7–5.3)
Sodium: 134 mEq/L — ABNORMAL LOW (ref 137–147)

## 2013-10-22 LAB — URINALYSIS, ROUTINE W REFLEX MICROSCOPIC
BILIRUBIN URINE: NEGATIVE
Glucose, UA: NEGATIVE mg/dL
KETONES UR: NEGATIVE mg/dL
NITRITE: POSITIVE — AB
Protein, ur: 30 mg/dL — AB
SPECIFIC GRAVITY, URINE: 1.025 (ref 1.005–1.030)
UROBILINOGEN UA: 0.2 mg/dL (ref 0.0–1.0)
pH: 6 (ref 5.0–8.0)

## 2013-10-22 LAB — CBC WITH DIFFERENTIAL/PLATELET
BASOS PCT: 0 % (ref 0–1)
Basophils Absolute: 0 10*3/uL (ref 0.0–0.1)
Eosinophils Absolute: 0.1 10*3/uL (ref 0.0–0.7)
Eosinophils Relative: 0 % (ref 0–5)
HCT: 33.7 % — ABNORMAL LOW (ref 36.0–46.0)
Hemoglobin: 11.7 g/dL — ABNORMAL LOW (ref 12.0–15.0)
Lymphocytes Relative: 16 % (ref 12–46)
Lymphs Abs: 2.1 10*3/uL (ref 0.7–4.0)
MCH: 32.2 pg (ref 26.0–34.0)
MCHC: 34.7 g/dL (ref 30.0–36.0)
MCV: 92.8 fL (ref 78.0–100.0)
Monocytes Absolute: 1.2 10*3/uL — ABNORMAL HIGH (ref 0.1–1.0)
Monocytes Relative: 9 % (ref 3–12)
NEUTROS ABS: 9.8 10*3/uL — AB (ref 1.7–7.7)
NEUTROS PCT: 75 % (ref 43–77)
Platelets: 293 10*3/uL (ref 150–400)
RBC: 3.63 MIL/uL — ABNORMAL LOW (ref 3.87–5.11)
RDW: 13.4 % (ref 11.5–15.5)
WBC: 13.1 10*3/uL — ABNORMAL HIGH (ref 4.0–10.5)

## 2013-10-22 LAB — URINE MICROSCOPIC-ADD ON

## 2013-10-22 LAB — HEMOGLOBIN A1C
Hgb A1c MFr Bld: 7.2 % — ABNORMAL HIGH (ref <5.7)
Mean Plasma Glucose: 160 mg/dL — ABNORMAL HIGH (ref <117)

## 2013-10-22 LAB — TROPONIN I: Troponin I: <0.3 ng/mL (ref <0.30)

## 2013-10-22 LAB — GLUCOSE, CAPILLARY
GLUCOSE-CAPILLARY: 164 mg/dL — AB (ref 70–99)
GLUCOSE-CAPILLARY: 157 mg/dL — AB (ref 70–99)
Glucose-Capillary: 135 mg/dL — ABNORMAL HIGH (ref 70–99)
Glucose-Capillary: 226 mg/dL — ABNORMAL HIGH (ref 70–99)

## 2013-10-22 LAB — I-STAT TROPONIN, ED: TROPONIN I, POC: 0 ng/mL (ref 0.00–0.08)

## 2013-10-22 MED ORDER — GI COCKTAIL ~~LOC~~
30.0000 mL | Freq: Once | ORAL | Status: AC
  Administered 2013-10-22: 30 mL via ORAL
  Filled 2013-10-22: qty 30

## 2013-10-22 MED ORDER — ADULT MULTIVITAMIN W/MINERALS CH
2.0000 | ORAL_TABLET | Freq: Every day | ORAL | Status: DC
  Administered 2013-10-22 – 2013-10-23 (×2): 2 via ORAL
  Filled 2013-10-22 (×3): qty 2

## 2013-10-22 MED ORDER — LEVOTHYROXINE SODIUM 88 MCG PO TABS
88.0000 ug | ORAL_TABLET | Freq: Every day | ORAL | Status: DC
  Administered 2013-10-22 – 2013-10-23 (×2): 88 ug via ORAL
  Filled 2013-10-22 (×3): qty 1

## 2013-10-22 MED ORDER — CALCIUM CARBONATE-VITAMIN D 500-200 MG-UNIT PO TABS
1.0000 | ORAL_TABLET | Freq: Two times a day (BID) | ORAL | Status: DC
  Administered 2013-10-22 – 2013-10-23 (×3): 1 via ORAL
  Filled 2013-10-22 (×5): qty 1

## 2013-10-22 MED ORDER — GI COCKTAIL ~~LOC~~: 30.0000 mL | Freq: Three times a day (TID) | ORAL | Status: DC | PRN

## 2013-10-22 MED ORDER — ACETAMINOPHEN 325 MG PO TABS: 650.0000 mg | ORAL_TABLET | Freq: Four times a day (QID) | ORAL | Status: DC | PRN

## 2013-10-22 MED ORDER — SODIUM CHLORIDE 0.9 % IV SOLN: 250.0000 mL | INTRAVENOUS | Status: DC | PRN

## 2013-10-22 MED ORDER — LORATADINE 10 MG PO TABS
10.0000 mg | ORAL_TABLET | Freq: Every day | ORAL | Status: DC
  Administered 2013-10-22 – 2013-10-23 (×2): 10 mg via ORAL
  Filled 2013-10-22 (×2): qty 1

## 2013-10-22 MED ORDER — CEFUROXIME AXETIL 500 MG PO TABS
500.0000 mg | ORAL_TABLET | Freq: Two times a day (BID) | ORAL | Status: DC
  Filled 2013-10-22 (×2): qty 1

## 2013-10-22 MED ORDER — ALUM & MAG HYDROXIDE-SIMETH 200-200-20 MG/5ML PO SUSP: 30.0000 mL | Freq: Four times a day (QID) | ORAL | Status: DC | PRN

## 2013-10-22 MED ORDER — SODIUM CHLORIDE 0.9 % IJ SOLN: 3.0000 mL | INTRAMUSCULAR | Status: DC | PRN

## 2013-10-22 MED ORDER — ACETAMINOPHEN 650 MG RE SUPP: 650.0000 mg | Freq: Four times a day (QID) | RECTAL | Status: DC | PRN

## 2013-10-22 MED ORDER — DOXYCYCLINE HYCLATE 100 MG PO TABS
100.0000 mg | ORAL_TABLET | Freq: Two times a day (BID) | ORAL | Status: DC
Start: 2013-10-22 — End: 2013-10-23
  Administered 2013-10-22 – 2013-10-23 (×2): 100 mg via ORAL
  Filled 2013-10-22 (×3): qty 1

## 2013-10-22 MED ORDER — INSULIN ASPART 100 UNIT/ML ~~LOC~~ SOLN
0.0000 [IU] | Freq: Three times a day (TID) | SUBCUTANEOUS | Status: DC
  Administered 2013-10-22: 2 [IU] via SUBCUTANEOUS
  Administered 2013-10-22: 3 [IU] via SUBCUTANEOUS
  Administered 2013-10-23: 2 [IU] via SUBCUTANEOUS

## 2013-10-22 MED ORDER — HYDROCHLOROTHIAZIDE 12.5 MG PO CAPS
12.5000 mg | ORAL_CAPSULE | Freq: Every morning | ORAL | Status: DC
  Administered 2013-10-22 – 2013-10-23 (×2): 12.5 mg via ORAL
  Filled 2013-10-22 (×2): qty 1

## 2013-10-22 MED ORDER — VITAMIN B-12 1000 MCG PO TABS
2500.0000 ug | ORAL_TABLET | Freq: Every day | ORAL | Status: DC
  Administered 2013-10-22 – 2013-10-23 (×2): 2500 ug via ORAL
  Filled 2013-10-22 (×2): qty 2.5

## 2013-10-22 MED ORDER — NITROGLYCERIN 2 % TD OINT
0.5000 [in_us] | TOPICAL_OINTMENT | Freq: Four times a day (QID) | TRANSDERMAL | Status: DC
  Administered 2013-10-22 (×4): 0.5 [in_us] via TOPICAL
  Filled 2013-10-22: qty 30

## 2013-10-22 MED ORDER — OXYCODONE HCL 5 MG PO TABS: 5.0000 mg | ORAL_TABLET | ORAL | Status: DC | PRN

## 2013-10-22 MED ORDER — NICOTINE 21 MG/24HR TD PT24
21.0000 mg | MEDICATED_PATCH | Freq: Every day | TRANSDERMAL | Status: DC
  Administered 2013-10-22: 21 mg via TRANSDERMAL
  Filled 2013-10-22 (×2): qty 1

## 2013-10-22 MED ORDER — LISINOPRIL 10 MG PO TABS
10.0000 mg | ORAL_TABLET | Freq: Every morning | ORAL | Status: DC
  Administered 2013-10-22 – 2013-10-23 (×2): 10 mg via ORAL
  Filled 2013-10-22 (×2): qty 1

## 2013-10-22 MED ORDER — SODIUM CHLORIDE 0.9 % IJ SOLN
3.0000 mL | Freq: Two times a day (BID) | INTRAMUSCULAR | Status: DC
  Administered 2013-10-22 (×2): 3 mL via INTRAVENOUS

## 2013-10-22 MED ORDER — ASPIRIN EC 325 MG PO TBEC: 325.0000 mg | DELAYED_RELEASE_TABLET | Freq: Every day | ORAL | Status: DC

## 2013-10-22 MED ORDER — ENOXAPARIN SODIUM 30 MG/0.3ML ~~LOC~~ SOLN
30.0000 mg | SUBCUTANEOUS | Status: DC
  Administered 2013-10-22: 30 mg via SUBCUTANEOUS
  Filled 2013-10-22 (×3): qty 0.3

## 2013-10-22 MED ORDER — OMEGA-3 FISH OIL 1200 MG PO CAPS
1.0000 | ORAL_CAPSULE | Freq: Every day | ORAL | Status: DC
  Administered 2013-10-22 – 2013-10-23 (×2): 1200 mg via ORAL
  Filled 2013-10-22 (×3): qty 1

## 2013-10-22 MED ORDER — METOPROLOL TARTRATE 12.5 MG HALF TABLET
12.5000 mg | ORAL_TABLET | Freq: Two times a day (BID) | ORAL | Status: DC
  Administered 2013-10-22 – 2013-10-23 (×3): 12.5 mg via ORAL
  Filled 2013-10-22 (×4): qty 1

## 2013-10-22 MED ORDER — ATORVASTATIN CALCIUM 10 MG PO TABS
10.0000 mg | ORAL_TABLET | Freq: Every evening | ORAL | Status: DC
  Administered 2013-10-22: 10 mg via ORAL
  Filled 2013-10-22 (×2): qty 1

## 2013-10-22 MED ORDER — HYDROMORPHONE HCL PF 1 MG/ML IJ SOLN: 0.5000 mg | INTRAMUSCULAR | Status: DC | PRN

## 2013-10-22 MED ORDER — ASPIRIN EC 81 MG PO TBEC
81.0000 mg | DELAYED_RELEASE_TABLET | Freq: Every day | ORAL | Status: DC
  Administered 2013-10-22: 81 mg via ORAL
  Filled 2013-10-22 (×2): qty 1

## 2013-10-22 MED ORDER — ONDANSETRON HCL 4 MG/2ML IJ SOLN: 4.0000 mg | Freq: Four times a day (QID) | INTRAMUSCULAR | Status: DC | PRN

## 2013-10-22 MED ORDER — BUPROPION HCL ER (SR) 100 MG PO TB12
100.0000 mg | ORAL_TABLET | Freq: Every morning | ORAL | Status: DC
  Administered 2013-10-22 – 2013-10-23 (×2): 100 mg via ORAL
  Filled 2013-10-22 (×2): qty 1

## 2013-10-22 MED ORDER — INSULIN ASPART 100 UNIT/ML ~~LOC~~ SOLN: 0.0000 [IU] | Freq: Every day | SUBCUTANEOUS | Status: DC

## 2013-10-22 MED ORDER — PANTOPRAZOLE SODIUM 40 MG PO TBEC
40.0000 mg | DELAYED_RELEASE_TABLET | Freq: Two times a day (BID) | ORAL | Status: DC
  Administered 2013-10-22 – 2013-10-23 (×2): 40 mg via ORAL
  Filled 2013-10-22 (×2): qty 1

## 2013-10-22 MED ORDER — ONDANSETRON HCL 4 MG PO TABS: 4.0000 mg | ORAL_TABLET | Freq: Four times a day (QID) | ORAL | Status: DC | PRN

## 2013-10-22 MED ORDER — PANTOPRAZOLE SODIUM 40 MG PO TBEC: 40.0000 mg | DELAYED_RELEASE_TABLET | Freq: Every day | ORAL | Status: DC

## 2013-10-22 NOTE — Progress Notes (Signed)
Patient seen and examined. Admitted after midnight secondary to CP. Patient reports no SOB and just mild indigestion after breakfast this morning. Troponin so far negative. For further info/details on admission hx referred to Dr. Arnoldo Morale H&P.  Plan: -cycle troponin and EKG -performed 2-D echo -start PPI BID -advise to quit smoking -will check lipid profile -continue lopressor, ASA, statins and ACE inhibitors  Barton Dubois [phone]

## 2013-10-22 NOTE — ED Provider Notes (Addendum)
CSN: [id number]     Arrival date & time 10/22/13  0014 History   First MD Initiated Contact with Patient 10/22/13 0103     Chief Complaint  Patient presents with  . Chest Pain     (Consider location/radiation/quality/duration/timing/severity/associated sxs/prior Treatment) HPI Patient is a 68 yo woman with DM, HTN, HLD and habit tobacco abuse with approx 45 pack year history.   She presents after experiencing 8/10, aching, nonradiating left anterior chest pain which began at 2000h while she was watching TV. Her pain resolved with NTG SL en route. She has some mild indigestion at this time but says she has no chest pain. She denies any history of similar sx. Says she had a cardiac stress test several years ago and this was a normal study. She is not followed by a cardiologist. Says she had slight SOB with CP. No nausea but endorses dry heaves with chest pain. No diaphoresis. No lightheadedness but does endorse associated weakness.   Patient has ASA en route.    Past Medical History  Diagnosis Date  . Diabetes mellitus   . Hypertension   . Hyperlipidemia   . Substance abuse   . Thyroid disease   . GERD (gastroesophageal reflux disease)    Past Surgical History  Procedure Laterality Date  . Abdominal hysterectomy  1995  . Replacement unicondylar joint knee  2004    left  . Rotator cuff repair  2004    left  . Gastric bypass  08/18/10  . Knee arthroscopy      right   Family History  Problem Relation Age of Onset  . Diabetes Mother   . Hypertension Mother   . Hyperlipidemia Mother   . Heart disease Father   . Cancer Maternal Uncle     prostate, colon  . Cancer Paternal Grandmother     unaware of what kind   History  Substance Use Topics  . Smoking status: Current Every Day Smoker -- 0.50 packs/day  . Smokeless tobacco: Not on file  . Alcohol Use: No   OB History   Grav Para Term Preterm Abortions TAB SAB Ect Mult Living                 Review of Systems Ten  point review of symptoms performed and is negative with the exception of symptoms noted above.     Allergies  Sulfa antibiotics  Home Medications   Prior to Admission medications   Medication Sig Start Date End Date Taking? Authorizing Provider  acetaminophen (TYLENOL) 500 MG tablet Take 1,000 mg by mouth every 6 (six) hours as needed for moderate pain.   Yes Historical Provider, MD  aspirin EC 81 MG tablet Take 81 mg by mouth every evening.   Yes Historical Provider, MD  atorvastatin (LIPITOR) 10 MG tablet Take 10 mg by mouth every evening.   Yes Historical Provider, MD  buPROPion (WELLBUTRIN SR) 100 MG 12 hr tablet Take 100 mg by mouth every morning.   Yes Historical Provider, MD  CALCIUM-VITAMIN D PO Take 1,200 mg by mouth daily.    Yes Historical Provider, MD  Cyanocobalamin (VITAMIN B-12) 2500 MCG SUBL Place 2,500 mcg under the tongue daily.   Yes Historical Provider, MD  hydrochlorothiazide (MICROZIDE) 12.5 MG capsule Take 12.5 mg by mouth every morning.   Yes Historical Provider, MD  levothyroxine (SYNTHROID, LEVOTHROID) 88 MCG tablet Take 88 mcg by mouth daily before breakfast.  12/31/10  Yes Historical Provider, MD  lisinopril (PRINIVIL,ZESTRIL) 10  MG tablet Take 10 mg by mouth every morning.    Yes Historical Provider, MD  loratadine (ALLERGY) 10 MG tablet Take 10 mg by mouth daily.     Yes Historical Provider, MD  metFORMIN (GLUCOPHAGE) 500 MG tablet Take 500 mg by mouth 2 (two) times daily with a meal.   Yes Historical Provider, MD  Multiple Vitamin (MULTIVITAMIN WITH MINERALS) TABS tablet Take 2 tablets by mouth every morning. gummy   Yes Historical Provider, MD  Omega-3 Fatty Acids (OMEGA-3 FISH OIL) 1200 MG CAPS Take 1 capsule by mouth every morning.   Yes Historical Provider, MD  PRESCRIPTION MEDICATION Take 1 tablet by mouth 2 (two) times daily. Antibiotic for UTI 100mg    Yes Historical Provider, MD  Soft Lens Products (VISINE FOR CONTACTS) SOLN Place 4 drops into both eyes 3  (three) times daily as needed (dry eyes).   Yes Historical Provider, MD   BP 128/69  Pulse 68  Temp(Src) 98.7 F (37.1 C) (Oral)  Resp 18  SpO2 99% Physical Exam Gen: well developed and well nourished appearing Head: NCAT Eyes: PERL, EOMI Nose: no epistaixis or rhinorrhea Mouth/throat: mucosa is moist and pink Neck: supple, no stridor Lungs: CTA B, no wheezing, rhonchi or rales CV: RRR, no murmur, extremities appear well perfused.  Abd: soft, notender, nondistended Back: no ttp, no cva ttp Skin: warm and dry Ext: normal to inspection, no dependent edema Neuro: CN ii-xii grossly intact, no focal deficits Psyche; normal affect,  calm and cooperative.   ED Course  Procedures (including critical care time) Labs Review  Results for orders placed during the hospital encounter of 10/22/13 (from the past 24 hour(s))  BASIC METABOLIC PANEL     Status: Abnormal   Collection Time    10/22/13  3:06 AM      Result Value Ref Range   Sodium 134 (*) 137 - 147 mEq/L   Potassium 4.2  3.7 - 5.3 mEq/L   Chloride 94 (*) 96 - 112 mEq/L   CO2 25  19 - 32 mEq/L   Glucose, Bld 118 (*) 70 - 99 mg/dL   BUN 24 (*) 6 - 23 mg/dL   Creatinine, Ser 0.78  0.50 - 1.10 mg/dL   Calcium 9.2  8.4 - 10.5 mg/dL   GFR calc non Af Amer 84 (*) >90 mL/min   GFR calc Af Amer >90  >90 mL/min  CBC WITH DIFFERENTIAL     Status: Abnormal   Collection Time    10/22/13  3:06 AM      Result Value Ref Range   WBC 13.1 (*) 4.0 - 10.5 K/uL   RBC 3.63 (*) 3.87 - 5.11 MIL/uL   Hemoglobin 11.7 (*) 12.0 - 15.0 g/dL   HCT 33.7 (*) 36.0 - 46.0 %   MCV 92.8  78.0 - 100.0 fL   MCH 32.2  26.0 - 34.0 pg   MCHC 34.7  30.0 - 36.0 g/dL   RDW 13.4  11.5 - 15.5 %   Platelets 293  150 - 400 K/uL   Neutrophils Relative % 75  43 - 77 %   Neutro Abs 9.8 (*) 1.7 - 7.7 K/uL   Lymphocytes Relative 16  12 - 46 %   Lymphs Abs 2.1  0.7 - 4.0 K/uL   Monocytes Relative 9  3 - 12 %   Monocytes Absolute 1.2 (*) 0.1 - 1.0 K/uL    Eosinophils Relative 0  0 - 5 %   Eosinophils Absolute 0.1  0.0 - 0.7 K/uL   Basophils Relative 0  0 - 1 %   Basophils Absolute 0.0  0.0 - 0.1 K/uL  I-STAT TROPOININ, ED     Status: None   Collection Time    10/22/13  3:10 AM      Result Value Ref Range   Troponin i, poc 0.00  0.00 - 0.08 ng/mL   Comment 3             EKG: nsr, no acute ischemic changes, normal intervals, normal axis, normal qrs complex  CXR: normal cardiac silloute, normal appearing mediastinum, no infiltrates, no acute process identified.  MDM   DDX: ACS, pneumothorax, pneumonia, pericardial or pleural effusion, gastritis, GERD/PUD, musculoskeletal pain.   ED work up is reassuring with normal EKG, negative first troponin and normal CXR. Remainder of labs also unremarkable. Concern is for ACS. Fortunately, the patient remains chest pain free. However, she is high risk for CAD and has not had a recent functional study. Will admit for full rule out and Cardiology consultation.     Elyn Peers, MD 10/22/13 [phone]  5102:  Case discussed with Dr. Arnoldo Morale who will admit to the Tele Unit.   Elyn Peers, MD 10/22/13 [phone]

## 2013-10-22 NOTE — H&P (Signed)
[facility] History and Physical  Sharon Lowe VOJ:[id number] DOB: November 24, 1945 DOA: 10/22/2013  Referring physician: EDP PCP: Mathews Argyle, MD  Specialists:   Chief Complaint: Chest Pain  HPI: Sharon Lowe is a 68 y.o. female with a history of HTN, DM2, Hyperlipidemia, and Tobacco Abuse who presents to the ED with complaints of substernal chest pain that began at 8 pm.  The pain was described as sharp and rates at an 8/10 and was without radiation.   She reports  having nausea and dry heaves associated with the pain. She denies having any diaphoresis.   The pain was relieved after EMS had arrived and given her SL NTG x 2, and ASA.   In the ED, she was evaluated and found to have a negative initial cardiac workup and was referred for a cardiac rule out.       Review of Systems:  Constitutional: No Weight Loss, No Weight Gain, Night Sweats, Fevers, Chills, Fatigue, or Generalized Weakness HEENT: No Headaches, Difficulty Swallowing,Tooth/Dental Problems,Sore Throat,  No Sneezing, Rhinitis, Ear Ache, Nasal Congestion, or Post Nasal Drip,  Cardio-vascular:  +Chest pain, Orthopnea, PND, Edema in lower extremities, Anasarca, Dizziness, Palpitations  Resp: No Dyspnea, No DOE, No Cough, No Hemoptysis, No Wheezing.    GI: No Heartburn, Indigestion, Abdominal Pain, +Nausea, +Vomiting, Diarrhea, Change in Bowel Habits,  Loss of Appetite  GU: No Dysuria, Change in Color of Urine, No Urgency or Frequency.  No flank pain.  Musculoskeletal: No Joint Pain or Swelling.  No Decreased Range of Motion. No Back Pain.  Neurologic: No Syncope, No Seizures, Muscle Weakness, Paresthesia, Vision Disturbance or Loss, No Diplopia, No Vertigo, No Difficulty Walking,  Skin: No Rash or Lesions. Psych: No Change in Mood or Affect. No Depression or Anxiety. No Memory loss. No Confusion or Hallucinations   Past Medical History  Diagnosis Date  . Diabetes mellitus   . Hypertension   . Hyperlipidemia   .  Substance abuse   . Thyroid disease   . GERD (gastroesophageal reflux disease)      Past Surgical History  Procedure Laterality Date  . Abdominal hysterectomy  1995  . Replacement unicondylar joint knee  2004    left  . Rotator cuff repair  2004    left  . Gastric bypass  08/18/10  . Knee arthroscopy      right       Prior to Admission medications   Medication Sig Start Date End Date Taking? Authorizing Provider  acetaminophen (TYLENOL) 500 MG tablet Take 1,000 mg by mouth every 6 (six) hours as needed for moderate pain.   Yes Historical Provider, MD  aspirin EC 81 MG tablet Take 81 mg by mouth every evening.   Yes Historical Provider, MD  atorvastatin (LIPITOR) 10 MG tablet Take 10 mg by mouth every evening.   Yes Historical Provider, MD  buPROPion (WELLBUTRIN SR) 100 MG 12 hr tablet Take 100 mg by mouth every morning.   Yes Historical Provider, MD  CALCIUM-VITAMIN D PO Take 1,200 mg by mouth daily.    Yes Historical Provider, MD  Cyanocobalamin (VITAMIN B-12) 2500 MCG SUBL Place 2,500 mcg under the tongue daily.   Yes Historical Provider, MD  hydrochlorothiazide (MICROZIDE) 12.5 MG capsule Take 12.5 mg by mouth every morning.   Yes Historical Provider, MD  levothyroxine (SYNTHROID, LEVOTHROID) 88 MCG tablet Take 88 mcg by mouth daily before breakfast.  12/31/10  Yes Historical Provider, MD  lisinopril (PRINIVIL,ZESTRIL) 10 MG tablet Take 10  mg by mouth every morning.    Yes Historical Provider, MD  loratadine (ALLERGY) 10 MG tablet Take 10 mg by mouth daily.     Yes Historical Provider, MD  metFORMIN (GLUCOPHAGE) 500 MG tablet Take 500 mg by mouth 2 (two) times daily with a meal.   Yes Historical Provider, MD  Multiple Vitamin (MULTIVITAMIN WITH MINERALS) TABS tablet Take 2 tablets by mouth every morning. gummy   Yes Historical Provider, MD  Omega-3 Fatty Acids (OMEGA-3 FISH OIL) 1200 MG CAPS Take 1 capsule by mouth every morning.   Yes Historical Provider, MD  PRESCRIPTION  MEDICATION Take 1 tablet by mouth 2 (two) times daily. Antibiotic for UTI 100mg    Yes Historical Provider, MD  Soft Lens Products (VISINE FOR CONTACTS) SOLN Place 4 drops into both eyes 3 (three) times daily as needed (dry eyes).   Yes Historical Provider, MD      Allergies  Allergen Reactions  . Sulfa Antibiotics Itching and Rash     Social History:  reports that she has been smoking.  She does not have any smokeless tobacco history on file. She reports that she does not drink alcohol or use illicit drugs.     Family History  Problem Relation Age of Onset  . Diabetes Mother   . Hypertension Mother   . Hyperlipidemia Mother   . Heart disease Father   . Cancer Maternal Uncle     prostate, colon  . Cancer Paternal Grandmother     unaware of what kind       Physical Exam:  GEN:  Pleasant Obese Elderly 68 y.o. Caucasian female examined and in no acute distress; cooperative with exam Filed Vitals:   10/22/13 0300 10/22/13 0400 10/22/13 0433 10/22/13 0517  BP: 138/71 130/73 130/73 132/70  Pulse: 69 73 71 68  Temp:    98.3 F (36.8 C)  TempSrc:    Oral  Resp: 18 15 13 18   Height:    5\' 7"  (1.702 m)  Weight:    78.019 kg (172 lb)  SpO2: 99% 98% 98% 97%   Blood pressure 132/70, pulse 68, temperature 98.3 F (36.8 C), temperature source Oral, resp. rate 18, height 5\' 7"  (1.702 m), weight 78.019 kg (172 lb), SpO2 97.00%. PSYCH: She is alert and oriented x4; does not appear anxious does not appear depressed; affect is normal HEENT: Normocephalic and Atraumatic, Mucous membranes pink; PERRLA; EOM intact; Fundi:  Benign;  No scleral icterus, Nares: Patent, Oropharynx: Clear, Fair Dentition, Neck:  FROM, no cervical lymphadenopathy nor thyromegaly or carotid bruit; no JVD; Breasts:: Not examined CHEST WALL: No tenderness CHEST: Normal respiration, clear to auscultation bilaterally HEART: Regular rate and rhythm; no murmurs rubs or gallops BACK: No kyphosis or scoliosis; no CVA  tenderness ABDOMEN: Positive Bowel Sounds, Obese, soft non-tender; no masses, no organomegaly, no pannus; no intertriginous candida. Rectal Exam: Not done EXTREMITIES: No cyanosis, clubbing or edema; no ulcerations. Genitalia: not examined PULSES: 2+ and symmetric SKIN: Normal hydration,  Numerous diffuse Seborrheic Keratotic Plaques on Thorax and ABD, No Ulceration CNS:  Alert and Oriented x 4, No Focal Deficits  Vascular: pulses palpable throughout    Labs on Admission:  Basic Metabolic Panel:  Recent Labs Lab 10/22/13 0306  NA 134*  K 4.2  CL 94*  CO2 25  GLUCOSE 118*  BUN 24*  CREATININE 0.78  CALCIUM 9.2   Liver Function Tests: No results found for this basename: AST, ALT, ALKPHOS, BILITOT, PROT, ALBUMIN,  in the last  168 hours No results found for this basename: LIPASE, AMYLASE,  in the last 168 hours No results found for this basename: AMMONIA,  in the last 168 hours CBC:  Recent Labs Lab 10/22/13 0306  WBC 13.1*  NEUTROABS 9.8*  HGB 11.7*  HCT 33.7*  MCV 92.8  PLT 293   Cardiac Enzymes:  Recent Labs Lab 10/22/13 0306  TROPONINI <0.30    BNP (last 3 results) No results found for this basename: PROBNP,  in the last 8760 hours CBG: No results found for this basename: GLUCAP,  in the last 168 hours  Radiological Exams on Admission: Dg Chest 2 View  10/22/2013   CLINICAL DATA:  Chest pain.  EXAM: CHEST  2 VIEW  COMPARISON:  DG CHEST 2 VIEW dated 02/17/2010  FINDINGS: The heart size and mediastinal contours are within normal limits. Mildly calcified aortic knob. Trace left lung base scarring. Both lungs are otherwise clear. The visualized skeletal structures are nonsuspicious, multilevel moderate degenerative disc disease.  IMPRESSION: No active cardiopulmonary disease.   Electronically Signed   By: Elon Alas   On: 10/22/2013 04:11     EKG: Independently reviewed. Normal sinus Rhythm ; Rate = 81, No acute S-T Changes   Assessment/Plan:   68  y.o. female with  Principal Problem:   Chest pain Active Problems:   DM (diabetes mellitus) for 20 years ID   Hypertension   Tobacco abuse   Other and unspecified hyperlipidemia   Leukocytosis, unspecified   Hypothyroid   1.  Chest Pain- admit to Observation telemetry Bed for cardiac monitoring, cycle Troponins, placed on Nitropaste, O2, ASA Rx.    2.   DM2-  Hold Metformin Rx, Check HbA1C in AM,  SSI coverage PRN.    3.   HTN- Continue Lisinopril rx, Monitor BPs.    4.   Hyperlipidemia- continue Atorvastatin Rx, and Omega 3 Fatty acids.    5.   Leukocytosis-  Monitor Trend.    6.   Tobacco Abuse- Counseled Re: Smoking Cessation, Nicotine Patch daily while in Hospital.    7.  Hypothyroid - continue Levothyroxine rx,    8.  DVT prophylaxis with Lovenox.    9. Other- UTI- on Antibiotic as outpt, medication needs to be verified and continued.       Code Status:   FULL CODE Family Communication:    No Family Present Disposition Plan:      Observation    Time spent: 50 Minutes  [facility] Pager [phone]  If 7PM-7AM, please contact night-coverage [email or web address] Password [facility] 10/22/2013, 6:17 AM

## 2013-10-22 NOTE — Progress Notes (Signed)
  Echocardiogram 2D Echocardiogram has been performed.  Sharon Lowe 10/22/2013, 12:16 PM

## 2013-10-22 NOTE — ED Notes (Signed)
Pt presents to ED via [facility] with c/o chest pain describes as tightness, onset last night around 2000. Pt reports dry heaves and SOB. Pain relief by sublingual nitro x3 and ASA, given by EMS. Pt diagnosed with UTI last Friday by [facility] and taking antibiotics. Per EMS, ECG showing PACs, BP-103/63 (dropped from 140/73 after taking nitro), HR-90, O2-100% on 2L, RR-16.

## 2013-10-23 DIAGNOSIS — E785 Hyperlipidemia, unspecified: Secondary | ICD-10-CM | POA: Diagnosis not present

## 2013-10-23 DIAGNOSIS — E119 Type 2 diabetes mellitus without complications: Secondary | ICD-10-CM | POA: Diagnosis not present

## 2013-10-23 DIAGNOSIS — R079 Chest pain, unspecified: Secondary | ICD-10-CM | POA: Diagnosis not present

## 2013-10-23 DIAGNOSIS — I10 Essential (primary) hypertension: Secondary | ICD-10-CM | POA: Diagnosis not present

## 2013-10-23 LAB — LIPID PANEL
Cholesterol: 124 mg/dL (ref 0–200)
HDL: 32 mg/dL — ABNORMAL LOW (ref >39)
LDL Cholesterol: 71 mg/dL (ref 0–99)
Total CHOL/HDL Ratio: 3.9 RATIO
Triglycerides: 105 mg/dL (ref <150)
VLDL: 21 mg/dL (ref 0–40)

## 2013-10-23 LAB — GLUCOSE, CAPILLARY: GLUCOSE-CAPILLARY: 172 mg/dL — AB (ref 70–99)

## 2013-10-23 MED ORDER — PANTOPRAZOLE SODIUM 40 MG PO TBEC: 40.0000 mg | DELAYED_RELEASE_TABLET | Freq: Two times a day (BID) | ORAL | Status: DC

## 2013-10-23 NOTE — Discharge Summary (Signed)
Physician Discharge Summary  Sharon Lowe WVP:[id number] DOB: [birth year] DOA: 10/22/2013  PCP: Mathews Argyle, MD  Admit date: 10/22/2013 Discharge date: 10/23/2013  Time spent: 45 minutes  Recommendations for Outpatient Follow-up:  -Will be discharged home today. -Advised to follow up with PCP in 2 weeks.   Discharge Diagnoses:  Principal Problem:   Chest pain Active Problems:   DM (diabetes mellitus) for 20 years ID   Hypertension   Tobacco abuse   Other and unspecified hyperlipidemia   Leukocytosis, unspecified   Discharge Condition: Stable and improved  Filed Weights   10/22/13 0517  Weight: 78.019 kg (172 lb)    History of present illness:  Sharon Lowe is a 68 y.o. female with a history of HTN, DM2, Hyperlipidemia, and Tobacco Abuse who presents to the ED with complaints of substernal chest pain that began at 8 pm. The pain was described as sharp and rates at an 8/10 and was without radiation. She reports having nausea and dry heaves associated with the pain. She denies having any diaphoresis. The pain was relieved after EMS had arrived and given her SL NTG x 2, and ASA. In the ED, she was evaluated and found to have a negative initial cardiac workup and was referred for a cardiac rule out. Hospitalist admission was requested.   Hospital Course:   Chest Pain -Has ruled out for ACS by negative troponins and an EKG without acute ischemic abnormalities. -2D ECHO has not crossed into system, but I have a hard report that shows a normal LV size and function with no wall motion or valvular abnormalities. -Chest pain has fully resolved. -By description, sounds GI (GERD) in etiology. -PPI has been prescribed and I would recommend continuing for 12 weeks.  DM 2 -Fair control. -Continue OP dosing adjustments.  HTN -Well controlled. -Continue home medications.  Hyperlipidemia -LDL ok at 70. -Continue statin at home dose.  Hypothyroidism -Continue  synthroid at home dose.  Procedures:  None   Consultations:  None  Discharge Instructions  Discharge Instructions   Diet - low sodium heart healthy    Complete by:  As directed      Discontinue IV    Complete by:  As directed      Increase activity slowly    Complete by:  As directed             Medication List    STOP taking these medications       PRESCRIPTION MEDICATION      TAKE these medications       acetaminophen 500 MG tablet  Commonly known as:  TYLENOL  Take 1,000 mg by mouth every 6 (six) hours as needed for moderate pain.     ALLERGY 10 MG tablet  Generic drug:  loratadine  Take 10 mg by mouth daily.     aspirin EC 81 MG tablet  Take 81 mg by mouth every evening.     atorvastatin 10 MG tablet  Commonly known as:  LIPITOR  Take 10 mg by mouth every evening.     buPROPion 100 MG 12 hr tablet  Commonly known as:  WELLBUTRIN SR  Take 100 mg by mouth every morning.     CALCIUM-VITAMIN D PO  Take 1,200 mg by mouth daily.     hydrochlorothiazide 12.5 MG capsule  Commonly known as:  MICROZIDE  Take 12.5 mg by mouth every morning.     levothyroxine 88 MCG tablet  Commonly known as:  SYNTHROID, LEVOTHROID  Take 88 mcg by mouth daily before breakfast.     lisinopril 10 MG tablet  Commonly known as:  PRINIVIL,ZESTRIL  Take 10 mg by mouth every morning.     metFORMIN 500 MG tablet  Commonly known as:  GLUCOPHAGE  Take 500 mg by mouth 2 (two) times daily with a meal.     multivitamin with minerals Tabs tablet  Take 2 tablets by mouth every morning. gummy     Omega-3 Fish Oil 1200 MG Caps  Take 1 capsule by mouth every morning.     pantoprazole 40 MG tablet  Commonly known as:  PROTONIX  Take 1 tablet (40 mg total) by mouth 2 (two) times daily.     VISINE FOR CONTACTS Soln  Place 4 drops into both eyes 3 (three) times daily as needed (dry eyes).     Vitamin B-12 2500 MCG Subl  Place 2,500 mcg under the tongue daily.       Allergies    Allergen Reactions  . Sulfa Antibiotics Itching and Rash       Follow-up Information   Follow up with Mathews Argyle, MD. Schedule an appointment as soon as possible for a visit in 2 weeks.   Specialty:  Internal Medicine   Contact information:   [address] [place] [address] [phone]        The results of significant diagnostics from this hospitalization (including imaging, microbiology, ancillary and laboratory) are listed below for reference.    Significant Diagnostic Studies: Dg Chest 2 View  10/22/2013   CLINICAL DATA:  Chest pain.  EXAM: CHEST  2 VIEW  COMPARISON:  DG CHEST 2 VIEW dated 02/17/2010  FINDINGS: The heart size and mediastinal contours are within normal limits. Mildly calcified aortic knob. Trace left lung base scarring. Both lungs are otherwise clear. The visualized skeletal structures are nonsuspicious, multilevel moderate degenerative disc disease.  IMPRESSION: No active cardiopulmonary disease.   Electronically Signed   By: Elon Alas   On: 10/22/2013 04:11   Mm Digital Screening Bilateral  09/27/2013   CLINICAL DATA:  Screening.  EXAM: DIGITAL SCREENING BILATERAL MAMMOGRAM WITH CAD  COMPARISON:  Previous exam(s).  ACR Breast Density Category b: There are scattered areas of fibroglandular density.  FINDINGS: There are no findings suspicious for malignancy. Images were processed with CAD.  IMPRESSION: No mammographic evidence of malignancy. A result letter of this screening mammogram will be mailed directly to the patient.  RECOMMENDATION: Screening mammogram in one year. (Code:SM-B-01Y)  BI-RADS CATEGORY  1: Negative.   Electronically Signed   By: Everlean Alstrom M.D.   On: 09/27/2013 09:29    Microbiology: No results found for this or any previous visit (from the past 240 hour(s)).   Labs: Basic Metabolic Panel:  Recent Labs Lab 10/22/13 0306  NA 134*  K 4.2  CL 94*  CO2 25  GLUCOSE 118*  BUN 24*  CREATININE 0.78   CALCIUM 9.2   Liver Function Tests: No results found for this basename: AST, ALT, ALKPHOS, BILITOT, PROT, ALBUMIN,  in the last 168 hours No results found for this basename: LIPASE, AMYLASE,  in the last 168 hours No results found for this basename: AMMONIA,  in the last 168 hours CBC:  Recent Labs Lab 10/22/13 0306  WBC 13.1*  NEUTROABS 9.8*  HGB 11.7*  HCT 33.7*  MCV 92.8  PLT 293   Cardiac Enzymes:  Recent Labs Lab 10/22/13 0306 10/22/13 1040 10/22/13 1628  TROPONINI <0.30 <0.30 <0.30   BNP: BNP (last 3 results) No results found for this basename: PROBNP,  in the last 8760 hours CBG:  Recent Labs Lab 10/22/13 0834 10/22/13 1149 10/22/13 1629 10/22/13 2038 10/23/13 0739  GLUCAP 164* 135* 226* 157* 172*       Signed:  Erline Hau  [facility] Pager: [phone] 10/23/2013, 9:16 AM

## 2013-10-24 ENCOUNTER — Encounter (HOSPITAL_COMMUNITY)
Admission: RE | Admit: 2013-10-24 | Discharge: 2013-10-24 | Disposition: A | Discharge status: Home / Self Care | Payer: Medicare Other | Source: Ambulatory Visit | Attending: Orthopedic Surgery | Admitting: Orthopedic Surgery

## 2013-10-24 ENCOUNTER — Encounter (HOSPITAL_COMMUNITY): Payer: Self-pay

## 2013-10-24 DIAGNOSIS — Z01812 Encounter for preprocedural laboratory examination: Secondary | ICD-10-CM | POA: Diagnosis not present

## 2013-10-24 DIAGNOSIS — I10 Essential (primary) hypertension: Secondary | ICD-10-CM | POA: Diagnosis not present

## 2013-10-24 HISTORY — DX: Major depressive disorder, single episode, unspecified: F32.9

## 2013-10-24 HISTORY — DX: Depression, unspecified: F32.A

## 2013-10-24 HISTORY — DX: Hypothyroidism, unspecified: E03.9

## 2013-10-24 HISTORY — DX: Unspecified osteoarthritis, unspecified site: M19.90

## 2013-10-24 LAB — PROTIME-INR
INR: 1.02 (ref 0.00–1.49)
Prothrombin Time: 13.2 seconds (ref 11.6–15.2)

## 2013-10-24 LAB — CBC
HCT: 38.4 % (ref 36.0–46.0)
Hemoglobin: 12.6 g/dL (ref 12.0–15.0)
MCH: 30.7 pg (ref 26.0–34.0)
MCHC: 32.8 g/dL (ref 30.0–36.0)
MCV: 93.7 fL (ref 78.0–100.0)
PLATELETS: 507 10*3/uL — AB (ref 150–400)
RBC: 4.1 MIL/uL (ref 3.87–5.11)
RDW: 13.3 % (ref 11.5–15.5)
WBC: 11.3 10*3/uL — ABNORMAL HIGH (ref 4.0–10.5)

## 2013-10-24 LAB — URINALYSIS, ROUTINE W REFLEX MICROSCOPIC
Bilirubin Urine: NEGATIVE
GLUCOSE, UA: NEGATIVE mg/dL
Ketones, ur: NEGATIVE mg/dL
Nitrite: POSITIVE — AB
Protein, ur: NEGATIVE mg/dL
SPECIFIC GRAVITY, URINE: 1.022 (ref 1.005–1.030)
Urobilinogen, UA: 0.2 mg/dL (ref 0.0–1.0)
pH: 5.5 (ref 5.0–8.0)

## 2013-10-24 LAB — URINE MICROSCOPIC-ADD ON

## 2013-10-24 LAB — SURGICAL PCR SCREEN
MRSA, PCR: NEGATIVE
STAPHYLOCOCCUS AUREUS: NEGATIVE

## 2013-10-24 LAB — BASIC METABOLIC PANEL
BUN: 31 mg/dL — AB (ref 6–23)
CALCIUM: 9.9 mg/dL (ref 8.4–10.5)
CO2: 26 mEq/L (ref 19–32)
Chloride: 96 mEq/L (ref 96–112)
Creatinine, Ser: 0.96 mg/dL (ref 0.50–1.10)
GFR calc Af Amer: 69 mL/min — ABNORMAL LOW (ref >90)
GFR, EST NON AFRICAN AMERICAN: 59 mL/min — AB (ref >90)
Glucose, Bld: 154 mg/dL — ABNORMAL HIGH (ref 70–99)
Potassium: 3.4 mEq/L — ABNORMAL LOW (ref 3.7–5.3)
Sodium: 139 mEq/L (ref 137–147)

## 2013-10-24 LAB — APTT: aPTT: 30 seconds (ref 24–37)

## 2013-10-24 NOTE — Progress Notes (Addendum)
Patient hospitalized over weekend at [facility] with chest pain. Per Discharge Summary and patient diagnosis of acid reflux.  See all info in EPIC.  Patient also diagnosed with Urinary tract infection on 10/20/2013.  Placed on doxycycline 100mg  bid x 7 days per [facility].  Urinalysis performed at time of preop on 10/24/2013.  Informed patient at time of preop appointment to call office of Dr Alvan Dame and inform them of above.

## 2013-10-24 NOTE — Progress Notes (Addendum)
EKG on chart  10/04/13 and in EPIC- 10/22/13.   ECHO on chart- 04/01/2012 and  in EPIC done 10/22/13.

## 2013-10-25 LAB — URINE CULTURE

## 2013-10-25 NOTE — Progress Notes (Signed)
Sherry at office of Dr Alvan Dame notified of chest pain admission on 10/21/2013 and discharge on 10/22/2013.  Also aware patient is being treated for uti with doxycycline for 7 days starting on 09/30/2013.

## 2013-10-30 ENCOUNTER — Inpatient Hospital Stay (HOSPITAL_COMMUNITY): Payer: Medicare Other | Admitting: Anesthesiology

## 2013-10-30 ENCOUNTER — Encounter (HOSPITAL_COMMUNITY): Payer: Medicare Other | Admitting: Anesthesiology

## 2013-10-30 ENCOUNTER — Encounter (HOSPITAL_COMMUNITY): Payer: Self-pay

## 2013-10-30 ENCOUNTER — Encounter (HOSPITAL_COMMUNITY)
Admission: RE | Disposition: A | Discharge status: Skilled Nursing Facility | Payer: Self-pay | Source: Ambulatory Visit | Attending: Orthopedic Surgery

## 2013-10-30 ENCOUNTER — Inpatient Hospital Stay (HOSPITAL_COMMUNITY)
Admission: RE | Admit: 2013-10-30 | Discharge: 2013-11-02 | DRG: 488 | Disposition: A | Discharge status: Skilled Nursing Facility | Payer: Medicare Other | Source: Ambulatory Visit | Attending: Orthopedic Surgery | Admitting: Orthopedic Surgery

## 2013-10-30 DIAGNOSIS — Z9884 Bariatric surgery status: Secondary | ICD-10-CM

## 2013-10-30 DIAGNOSIS — K219 Gastro-esophageal reflux disease without esophagitis: Secondary | ICD-10-CM | POA: Diagnosis present

## 2013-10-30 DIAGNOSIS — Z5189 Encounter for other specified aftercare: Secondary | ICD-10-CM | POA: Diagnosis not present

## 2013-10-30 DIAGNOSIS — Z79899 Other long term (current) drug therapy: Secondary | ICD-10-CM

## 2013-10-30 DIAGNOSIS — Z6825 Body mass index (BMI) 25.0-25.9, adult: Secondary | ICD-10-CM | POA: Diagnosis not present

## 2013-10-30 DIAGNOSIS — Y831 Surgical operation with implant of artificial internal device as the cause of abnormal reaction of the patient, or of later complication, without mention of misadventure at the time of the procedure: Secondary | ICD-10-CM | POA: Diagnosis present

## 2013-10-30 DIAGNOSIS — Z8673 Personal history of transient ischemic attack (TIA), and cerebral infarction without residual deficits: Secondary | ICD-10-CM

## 2013-10-30 DIAGNOSIS — S8990XA Unspecified injury of unspecified lower leg, initial encounter: Secondary | ICD-10-CM | POA: Diagnosis not present

## 2013-10-30 DIAGNOSIS — E119 Type 2 diabetes mellitus without complications: Secondary | ICD-10-CM | POA: Diagnosis present

## 2013-10-30 DIAGNOSIS — Z881 Allergy status to other antibiotic agents status: Secondary | ICD-10-CM

## 2013-10-30 DIAGNOSIS — D62 Acute posthemorrhagic anemia: Secondary | ICD-10-CM | POA: Diagnosis not present

## 2013-10-30 DIAGNOSIS — E871 Hypo-osmolality and hyponatremia: Secondary | ICD-10-CM | POA: Diagnosis not present

## 2013-10-30 DIAGNOSIS — E785 Hyperlipidemia, unspecified: Secondary | ICD-10-CM | POA: Diagnosis present

## 2013-10-30 DIAGNOSIS — Z87442 Personal history of urinary calculi: Secondary | ICD-10-CM | POA: Diagnosis not present

## 2013-10-30 DIAGNOSIS — I10 Essential (primary) hypertension: Secondary | ICD-10-CM | POA: Diagnosis present

## 2013-10-30 DIAGNOSIS — E876 Hypokalemia: Secondary | ICD-10-CM | POA: Diagnosis not present

## 2013-10-30 DIAGNOSIS — Z01812 Encounter for preprocedural laboratory examination: Secondary | ICD-10-CM | POA: Diagnosis not present

## 2013-10-30 DIAGNOSIS — Z96659 Presence of unspecified artificial knee joint: Secondary | ICD-10-CM

## 2013-10-30 DIAGNOSIS — E663 Overweight: Secondary | ICD-10-CM | POA: Diagnosis present

## 2013-10-30 DIAGNOSIS — F17200 Nicotine dependence, unspecified, uncomplicated: Secondary | ICD-10-CM | POA: Diagnosis present

## 2013-10-30 DIAGNOSIS — Z7982 Long term (current) use of aspirin: Secondary | ICD-10-CM | POA: Diagnosis not present

## 2013-10-30 DIAGNOSIS — F329 Major depressive disorder, single episode, unspecified: Secondary | ICD-10-CM | POA: Diagnosis present

## 2013-10-30 DIAGNOSIS — M25569 Pain in unspecified knee: Secondary | ICD-10-CM | POA: Diagnosis not present

## 2013-10-30 DIAGNOSIS — S99929A Unspecified injury of unspecified foot, initial encounter: Secondary | ICD-10-CM | POA: Diagnosis not present

## 2013-10-30 DIAGNOSIS — M129 Arthropathy, unspecified: Secondary | ICD-10-CM | POA: Diagnosis present

## 2013-10-30 DIAGNOSIS — F3289 Other specified depressive episodes: Secondary | ICD-10-CM | POA: Diagnosis present

## 2013-10-30 DIAGNOSIS — D500 Iron deficiency anemia secondary to blood loss (chronic): Secondary | ICD-10-CM | POA: Diagnosis not present

## 2013-10-30 DIAGNOSIS — E039 Hypothyroidism, unspecified: Secondary | ICD-10-CM | POA: Diagnosis present

## 2013-10-30 DIAGNOSIS — E079 Disorder of thyroid, unspecified: Secondary | ICD-10-CM | POA: Diagnosis not present

## 2013-10-30 DIAGNOSIS — T84099A Other mechanical complication of unspecified internal joint prosthesis, initial encounter: Secondary | ICD-10-CM | POA: Diagnosis not present

## 2013-10-30 DIAGNOSIS — G4730 Sleep apnea, unspecified: Secondary | ICD-10-CM | POA: Diagnosis present

## 2013-10-30 DIAGNOSIS — Z471 Aftercare following joint replacement surgery: Secondary | ICD-10-CM | POA: Diagnosis not present

## 2013-10-30 DIAGNOSIS — D649 Anemia, unspecified: Secondary | ICD-10-CM | POA: Diagnosis not present

## 2013-10-30 HISTORY — PX: TOTAL KNEE REVISION WITH SCAR DEBRIDEMENT/PATELLA REVISION WITH POLY EXCHANGE: SHX6128

## 2013-10-30 LAB — ABO/RH: ABO/RH(D): A NEG

## 2013-10-30 LAB — TYPE AND SCREEN
ABO/RH(D): A NEG
Antibody Screen: NEGATIVE

## 2013-10-30 LAB — GLUCOSE, CAPILLARY
Glucose-Capillary: 130 mg/dL — ABNORMAL HIGH (ref 70–99)
Glucose-Capillary: 126 mg/dL — ABNORMAL HIGH (ref 70–99)
Glucose-Capillary: 264 mg/dL — ABNORMAL HIGH (ref 70–99)

## 2013-10-30 SURGERY — TOTAL KNEE REVISION WITH SCAR DEBRIDEMENT/PATELLA REVISION WITH POLY EXCHANGE
Anesthesia: General | Site: Knee | Laterality: Left

## 2013-10-30 MED ORDER — METHOCARBAMOL 500 MG PO TABS
500.0000 mg | ORAL_TABLET | Freq: Four times a day (QID) | ORAL | Status: DC | PRN
  Administered 2013-10-31 – 2013-11-01 (×4): 500 mg via ORAL
  Filled 2013-10-30 (×4): qty 1

## 2013-10-30 MED ORDER — METFORMIN HCL 500 MG PO TABS
500.0000 mg | ORAL_TABLET | Freq: Two times a day (BID) | ORAL | Status: DC
  Administered 2013-10-31 – 2013-11-02 (×5): 500 mg via ORAL
  Filled 2013-10-30 (×7): qty 1

## 2013-10-30 MED ORDER — CEFAZOLIN SODIUM-DEXTROSE 2-3 GM-% IV SOLR
INTRAVENOUS | Status: AC
  Filled 2013-10-30: qty 50

## 2013-10-30 MED ORDER — BUPIVACAINE LIPOSOME 1.3 % IJ SUSP
20.0000 mL | Freq: Once | INTRAMUSCULAR | Status: AC
  Administered 2013-10-30: 20 mL
  Filled 2013-10-30: qty 20

## 2013-10-30 MED ORDER — ALUM & MAG HYDROXIDE-SIMETH 200-200-20 MG/5ML PO SUSP: 30.0000 mL | ORAL | Status: DC | PRN

## 2013-10-30 MED ORDER — KETOROLAC TROMETHAMINE 30 MG/ML IJ SOLN
INTRAMUSCULAR | Status: AC
  Filled 2013-10-30: qty 1

## 2013-10-30 MED ORDER — FENTANYL CITRATE 0.05 MG/ML IJ SOLN
INTRAMUSCULAR | Status: DC | PRN
  Administered 2013-10-30: 25 ug via INTRAVENOUS
  Administered 2013-10-30: 50 ug via INTRAVENOUS
  Administered 2013-10-30: 25 ug via INTRAVENOUS
  Administered 2013-10-30 (×2): 50 ug via INTRAVENOUS
  Administered 2013-10-30: 100 ug via INTRAVENOUS
  Administered 2013-10-30: 50 ug via INTRAVENOUS

## 2013-10-30 MED ORDER — HYDROMORPHONE HCL PF 1 MG/ML IJ SOLN: 0.2500 mg | INTRAMUSCULAR | Status: DC | PRN

## 2013-10-30 MED ORDER — LIP MEDEX EX OINT
TOPICAL_OINTMENT | CUTANEOUS | Status: AC
  Administered 2013-10-31: 1
  Filled 2013-10-30: qty 7

## 2013-10-30 MED ORDER — HYDROCODONE-ACETAMINOPHEN 7.5-325 MG PO TABS
1.0000 | ORAL_TABLET | ORAL | Status: DC
  Administered 2013-10-30: 1 via ORAL
  Administered 2013-10-31 (×2): 2 via ORAL
  Administered 2013-10-31: 1 via ORAL
  Administered 2013-10-31 – 2013-11-02 (×12): 2 via ORAL
  Filled 2013-10-30: qty 2
  Filled 2013-10-30: qty 1
  Filled 2013-10-30 (×14): qty 2

## 2013-10-30 MED ORDER — CEFAZOLIN SODIUM-DEXTROSE 2-3 GM-% IV SOLR
2.0000 g | Freq: Four times a day (QID) | INTRAVENOUS | Status: AC
  Administered 2013-10-30 – 2013-10-31 (×2): 2 g via INTRAVENOUS
  Filled 2013-10-30 (×2): qty 50

## 2013-10-30 MED ORDER — MIDAZOLAM HCL 2 MG/2ML IJ SOLN
INTRAMUSCULAR | Status: AC
  Filled 2013-10-30: qty 2

## 2013-10-30 MED ORDER — ONDANSETRON HCL 4 MG/2ML IJ SOLN
INTRAMUSCULAR | Status: AC
  Filled 2013-10-30: qty 2

## 2013-10-30 MED ORDER — KETOROLAC TROMETHAMINE 30 MG/ML IJ SOLN
INTRAMUSCULAR | Status: DC | PRN
  Administered 2013-10-30: 30 mg

## 2013-10-30 MED ORDER — SODIUM CHLORIDE 0.9 % IV SOLN
INTRAVENOUS | Status: DC
  Administered 2013-10-30: 22:00:00 via INTRAVENOUS
  Filled 2013-10-30 (×5): qty 1000

## 2013-10-30 MED ORDER — CEFAZOLIN SODIUM-DEXTROSE 2-3 GM-% IV SOLR
2.0000 g | INTRAVENOUS | Status: AC
  Administered 2013-10-30: 2 g via INTRAVENOUS

## 2013-10-30 MED ORDER — FENTANYL CITRATE 0.05 MG/ML IJ SOLN
INTRAMUSCULAR | Status: AC
  Filled 2013-10-30: qty 2

## 2013-10-30 MED ORDER — PROPOFOL 10 MG/ML IV BOLUS
INTRAVENOUS | Status: DC | PRN
  Administered 2013-10-30: 175 mg via INTRAVENOUS

## 2013-10-30 MED ORDER — CHLORHEXIDINE GLUCONATE 4 % EX LIQD: 60.0000 mL | Freq: Once | CUTANEOUS | Status: DC

## 2013-10-30 MED ORDER — PANTOPRAZOLE SODIUM 40 MG PO TBEC
40.0000 mg | DELAYED_RELEASE_TABLET | Freq: Two times a day (BID) | ORAL | Status: DC
  Administered 2013-10-30 – 2013-11-02 (×6): 40 mg via ORAL
  Filled 2013-10-30 (×8): qty 1

## 2013-10-30 MED ORDER — ACETAMINOPHEN 10 MG/ML IV SOLN
INTRAVENOUS | Status: DC | PRN
  Administered 2013-10-30: 1000 mg via INTRAVENOUS

## 2013-10-30 MED ORDER — POLYETHYLENE GLYCOL 3350 17 G PO PACK
17.0000 g | PACK | Freq: Two times a day (BID) | ORAL | Status: DC
  Administered 2013-10-30 – 2013-11-02 (×6): 17 g via ORAL

## 2013-10-30 MED ORDER — METOCLOPRAMIDE HCL 5 MG/ML IJ SOLN: 5.0000 mg | Freq: Three times a day (TID) | INTRAMUSCULAR | Status: DC | PRN

## 2013-10-30 MED ORDER — SODIUM CHLORIDE 0.9 % IR SOLN
Status: DC | PRN
  Administered 2013-10-30: 1000 mL

## 2013-10-30 MED ORDER — HYDROMORPHONE HCL PF 2 MG/ML IJ SOLN
INTRAMUSCULAR | Status: AC
Start: 2013-10-30 — End: 2013-10-30
  Filled 2013-10-30: qty 1

## 2013-10-30 MED ORDER — LEVOTHYROXINE SODIUM 88 MCG PO TABS
88.0000 ug | ORAL_TABLET | Freq: Every day | ORAL | Status: DC
  Administered 2013-10-31 – 2013-11-02 (×3): 88 ug via ORAL
  Filled 2013-10-30 (×4): qty 1

## 2013-10-30 MED ORDER — ATORVASTATIN CALCIUM 10 MG PO TABS
10.0000 mg | ORAL_TABLET | Freq: Every evening | ORAL | Status: DC
  Administered 2013-10-30 – 2013-11-01 (×3): 10 mg via ORAL
  Filled 2013-10-30 (×4): qty 1

## 2013-10-30 MED ORDER — DOCUSATE SODIUM 100 MG PO CAPS
100.0000 mg | ORAL_CAPSULE | Freq: Two times a day (BID) | ORAL | Status: DC
  Administered 2013-10-30 – 2013-11-02 (×6): 100 mg via ORAL

## 2013-10-30 MED ORDER — BUPIVACAINE-EPINEPHRINE (PF) 0.25% -1:200000 IJ SOLN
INTRAMUSCULAR | Status: DC | PRN
  Administered 2013-10-30: 30 mL

## 2013-10-30 MED ORDER — NEOSTIGMINE METHYLSULFATE 10 MG/10ML IV SOLN
INTRAVENOUS | Status: AC
  Filled 2013-10-30: qty 1

## 2013-10-30 MED ORDER — LACTATED RINGERS IV SOLN: INTRAVENOUS | Status: DC

## 2013-10-30 MED ORDER — FERROUS SULFATE 325 (65 FE) MG PO TABS
325.0000 mg | ORAL_TABLET | Freq: Three times a day (TID) | ORAL | Status: DC
  Administered 2013-10-31 – 2013-11-02 (×7): 325 mg via ORAL
  Filled 2013-10-30 (×10): qty 1

## 2013-10-30 MED ORDER — MIDAZOLAM HCL 5 MG/5ML IJ SOLN
INTRAMUSCULAR | Status: DC | PRN
  Administered 2013-10-30 (×2): 1 mg via INTRAVENOUS

## 2013-10-30 MED ORDER — HYDROCHLOROTHIAZIDE 12.5 MG PO CAPS
12.5000 mg | ORAL_CAPSULE | Freq: Every morning | ORAL | Status: DC
  Administered 2013-10-31 – 2013-11-02 (×3): 12.5 mg via ORAL
  Filled 2013-10-30 (×3): qty 1

## 2013-10-30 MED ORDER — ACETAMINOPHEN 10 MG/ML IV SOLN
1000.0000 mg | Freq: Once | INTRAVENOUS | Status: DC
  Filled 2013-10-30: qty 100

## 2013-10-30 MED ORDER — NEOSTIGMINE METHYLSULFATE 10 MG/10ML IV SOLN
INTRAVENOUS | Status: DC | PRN
  Administered 2013-10-30: 1 mg via INTRAVENOUS

## 2013-10-30 MED ORDER — ASPIRIN EC 325 MG PO TBEC
325.0000 mg | DELAYED_RELEASE_TABLET | Freq: Two times a day (BID) | ORAL | Status: DC
  Administered 2013-10-31 – 2013-11-02 (×5): 325 mg via ORAL
  Filled 2013-10-30 (×7): qty 1

## 2013-10-30 MED ORDER — GLYCOPYRROLATE 0.2 MG/ML IJ SOLN
INTRAMUSCULAR | Status: AC
  Filled 2013-10-30: qty 3

## 2013-10-30 MED ORDER — ONDANSETRON HCL 4 MG/2ML IJ SOLN: 4.0000 mg | Freq: Four times a day (QID) | INTRAMUSCULAR | Status: DC | PRN

## 2013-10-30 MED ORDER — PHENOL 1.4 % MT LIQD: 1.0000 | OROMUCOSAL | Status: DC | PRN

## 2013-10-30 MED ORDER — ONDANSETRON HCL 4 MG/2ML IJ SOLN
INTRAMUSCULAR | Status: DC | PRN
  Administered 2013-10-30 (×2): 2 mg via INTRAVENOUS

## 2013-10-30 MED ORDER — GLYCOPYRROLATE 0.2 MG/ML IJ SOLN
INTRAMUSCULAR | Status: DC | PRN
  Administered 2013-10-30: .2 mg via INTRAVENOUS

## 2013-10-30 MED ORDER — PROPOFOL 10 MG/ML IV BOLUS
INTRAVENOUS | Status: AC
  Filled 2013-10-30: qty 20

## 2013-10-30 MED ORDER — DIPHENHYDRAMINE HCL 25 MG PO CAPS: 25.0000 mg | ORAL_CAPSULE | Freq: Four times a day (QID) | ORAL | Status: DC | PRN

## 2013-10-30 MED ORDER — CISATRACURIUM BESYLATE (PF) 10 MG/5ML IV SOLN
INTRAVENOUS | Status: DC | PRN
  Administered 2013-10-30: 2 mg via INTRAVENOUS
  Administered 2013-10-30: 6 mg via INTRAVENOUS

## 2013-10-30 MED ORDER — METHOCARBAMOL 1000 MG/10ML IJ SOLN
500.0000 mg | Freq: Four times a day (QID) | INTRAVENOUS | Status: DC | PRN
  Administered 2013-10-31: 500 mg via INTRAVENOUS
  Filled 2013-10-30: qty 5

## 2013-10-30 MED ORDER — HYDROMORPHONE HCL PF 1 MG/ML IJ SOLN
0.5000 mg | INTRAMUSCULAR | Status: DC | PRN
  Administered 2013-10-31: 0.5 mg via INTRAVENOUS
  Filled 2013-10-30: qty 1

## 2013-10-30 MED ORDER — SODIUM CHLORIDE 0.9 % IJ SOLN
INTRAMUSCULAR | Status: DC | PRN
  Administered 2013-10-30: 9 mL

## 2013-10-30 MED ORDER — PHENYLEPHRINE HCL 10 MG/ML IJ SOLN
INTRAMUSCULAR | Status: DC | PRN
  Administered 2013-10-30: 20 ug via INTRAVENOUS
  Administered 2013-10-30: 40 ug via INTRAVENOUS

## 2013-10-30 MED ORDER — EPHEDRINE SULFATE 50 MG/ML IJ SOLN
INTRAMUSCULAR | Status: DC | PRN
  Administered 2013-10-30: 5 mg via INTRAVENOUS

## 2013-10-30 MED ORDER — LIDOCAINE HCL (CARDIAC) 20 MG/ML IV SOLN
INTRAVENOUS | Status: DC | PRN
  Administered 2013-10-30: 75 mg via INTRAVENOUS

## 2013-10-30 MED ORDER — FENTANYL CITRATE 0.05 MG/ML IJ SOLN
INTRAMUSCULAR | Status: AC
  Filled 2013-10-30: qty 5

## 2013-10-30 MED ORDER — MENTHOL 3 MG MT LOZG
1.0000 | LOZENGE | OROMUCOSAL | Status: DC | PRN
  Filled 2013-10-30: qty 9

## 2013-10-30 MED ORDER — SUCCINYLCHOLINE CHLORIDE 20 MG/ML IJ SOLN
INTRAMUSCULAR | Status: DC | PRN
  Administered 2013-10-30: 100 mg via INTRAVENOUS

## 2013-10-30 MED ORDER — INSULIN ASPART 100 UNIT/ML ~~LOC~~ SOLN
0.0000 [IU] | Freq: Three times a day (TID) | SUBCUTANEOUS | Status: DC
  Administered 2013-10-31: 2 [IU] via SUBCUTANEOUS
  Administered 2013-10-31: 3 [IU] via SUBCUTANEOUS
  Administered 2013-10-31: 2 [IU] via SUBCUTANEOUS
  Administered 2013-11-01 (×2): 3 [IU] via SUBCUTANEOUS
  Administered 2013-11-02: 2 [IU] via SUBCUTANEOUS

## 2013-10-30 MED ORDER — LORATADINE 10 MG PO TABS
10.0000 mg | ORAL_TABLET | Freq: Every day | ORAL | Status: DC
  Administered 2013-10-31 – 2013-11-02 (×3): 10 mg via ORAL
  Filled 2013-10-30 (×3): qty 1

## 2013-10-30 MED ORDER — MAGNESIUM CITRATE PO SOLN: 1.0000 | Freq: Once | ORAL | Status: AC | PRN

## 2013-10-30 MED ORDER — HYDROMORPHONE HCL PF 1 MG/ML IJ SOLN
INTRAMUSCULAR | Status: DC | PRN
  Administered 2013-10-30 (×2): 1 mg via INTRAVENOUS

## 2013-10-30 MED ORDER — METOCLOPRAMIDE HCL 10 MG PO TABS: 5.0000 mg | ORAL_TABLET | Freq: Three times a day (TID) | ORAL | Status: DC | PRN

## 2013-10-30 MED ORDER — BISACODYL 10 MG RE SUPP: 10.0000 mg | Freq: Every day | RECTAL | Status: DC | PRN

## 2013-10-30 MED ORDER — LACTATED RINGERS IV SOLN
INTRAVENOUS | Status: DC | PRN
  Administered 2013-10-30 (×2): via INTRAVENOUS

## 2013-10-30 MED ORDER — BUPROPION HCL ER (SR) 100 MG PO TB12
100.0000 mg | ORAL_TABLET | Freq: Every morning | ORAL | Status: DC
  Administered 2013-10-31 – 2013-11-02 (×3): 100 mg via ORAL
  Filled 2013-10-30 (×3): qty 1

## 2013-10-30 MED ORDER — ONDANSETRON HCL 4 MG PO TABS: 4.0000 mg | ORAL_TABLET | Freq: Four times a day (QID) | ORAL | Status: DC | PRN

## 2013-10-30 MED ORDER — SODIUM CHLORIDE 0.9 % IJ SOLN
INTRAMUSCULAR | Status: AC
  Filled 2013-10-30: qty 10

## 2013-10-30 MED ORDER — BUPIVACAINE-EPINEPHRINE (PF) 0.25% -1:200000 IJ SOLN
INTRAMUSCULAR | Status: AC
  Filled 2013-10-30: qty 30

## 2013-10-30 SURGICAL SUPPLY — 60 items
BAG SPEC THK2 15X12 ZIP CLS (MISCELLANEOUS)
BAG ZIPLOCK 12X15 (MISCELLANEOUS) ×1 IMPLANT
BANDAGE ELASTIC 6 VELCRO ST LF (GAUZE/BANDAGES/DRESSINGS) ×3 IMPLANT
BANDAGE ESMARK 6X9 LF (GAUZE/BANDAGES/DRESSINGS) ×1 IMPLANT
BLADE SAW SGTL 13.0X1.19X90.0M (BLADE) ×5 IMPLANT
BLADE SAW SGTL 81X20 HD (BLADE) ×1 IMPLANT
BNDG CMPR 9X6 STRL LF SNTH (GAUZE/BANDAGES/DRESSINGS) ×1
BNDG ESMARK 6X9 LF (GAUZE/BANDAGES/DRESSINGS) ×3
BOWL SMART MIX CTS (DISPOSABLE) ×2 IMPLANT
CEMENT HV SMART SET (Cement) ×6 IMPLANT
CUFF TOURN SGL QUICK 34 (TOURNIQUET CUFF) ×3
CUFF TRNQT CYL 34X4X40X1 (TOURNIQUET CUFF) ×1 IMPLANT
DRAPE EXTREMITY T 121X128X90 (DRAPE) ×3 IMPLANT
DRAPE POUCH INSTRU U-SHP 10X18 (DRAPES) ×3 IMPLANT
DRAPE U-SHAPE 47X51 STRL (DRAPES) ×3 IMPLANT
DRSG ADAPTIC 3X8 NADH LF (GAUZE/BANDAGES/DRESSINGS) ×3 IMPLANT
DRSG EMULSION OIL 3X16 NADH (GAUZE/BANDAGES/DRESSINGS) ×2 IMPLANT
DRSG PAD ABDOMINAL 8X10 ST (GAUZE/BANDAGES/DRESSINGS) ×3 IMPLANT
DURAPREP 26ML APPLICATOR (WOUND CARE) ×5 IMPLANT
ELECT REM PT RETURN 9FT ADLT (ELECTROSURGICAL) ×3
ELECTRODE REM PT RTRN 9FT ADLT (ELECTROSURGICAL) ×1 IMPLANT
EVACUATOR 1/8 PVC DRAIN (DRAIN) ×1 IMPLANT
FACESHIELD WRAPAROUND (MASK) ×12 IMPLANT
FACESHIELD WRAPAROUND OR TEAM (MASK) ×5 IMPLANT
GLOVE BIOGEL PI IND STRL 7.5 (GLOVE) ×1 IMPLANT
GLOVE BIOGEL PI IND STRL 8 (GLOVE) ×2 IMPLANT
GLOVE BIOGEL PI INDICATOR 7.5 (GLOVE) ×2
GLOVE BIOGEL PI INDICATOR 8 (GLOVE) ×4
GLOVE ORTHO TXT STRL SZ7.5 (GLOVE) ×6 IMPLANT
GLOVE SURG ORTHO 8.0 STRL STRW (GLOVE) ×3 IMPLANT
GOWN SPEC L3 XXLG W/TWL (GOWN DISPOSABLE) ×6 IMPLANT
GOWN STRL REUS W/TWL LRG LVL3 (GOWN DISPOSABLE) ×5 IMPLANT
HANDPIECE INTERPULSE COAX TIP (DISPOSABLE) ×3
IMMOBILIZER KNEE 20 (SOFTGOODS) IMPLANT
INSERT TIBIA SCORPIO FLEX 9X18 (Orthopedic Implant) ×2 IMPLANT
KIT BASIN OR (CUSTOM PROCEDURE TRAY) ×3 IMPLANT
MANIFOLD NEPTUNE II (INSTRUMENTS) ×3 IMPLANT
NDL SAFETY ECLIPSE 18X1.5 (NEEDLE) ×1 IMPLANT
NEEDLE HYPO 18GX1.5 SHARP (NEEDLE) ×3
NS IRRIG 1000ML POUR BTL (IV SOLUTION) ×3 IMPLANT
PACK TOTAL JOINT (CUSTOM PROCEDURE TRAY) ×3 IMPLANT
PADDING CAST COTTON 6X4 STRL (CAST SUPPLIES) ×4 IMPLANT
PATELLA SCORPIO UDOME SZ 5 8MM (Knees) ×2 IMPLANT
POSITIONER SURGICAL ARM (MISCELLANEOUS) ×3 IMPLANT
SET HNDPC FAN SPRY TIP SCT (DISPOSABLE) ×1 IMPLANT
SET PAD KNEE POSITIONER (MISCELLANEOUS) ×3 IMPLANT
SPONGE GAUZE 4X4 12PLY (GAUZE/BANDAGES/DRESSINGS) ×4 IMPLANT
SPONGE LAP 18X18 X RAY DECT (DISPOSABLE) ×3 IMPLANT
STAPLER VISISTAT 35W (STAPLE) ×2 IMPLANT
SUCTION FRAZIER 12FR DISP (SUCTIONS) ×1 IMPLANT
SUT VIC AB 1 CT1 36 (SUTURE) ×9 IMPLANT
SUT VIC AB 2-0 CT1 27 (SUTURE) ×9
SUT VIC AB 2-0 CT1 TAPERPNT 27 (SUTURE) ×3 IMPLANT
SUT VLOC 180 0 24IN GS25 (SUTURE) ×2 IMPLANT
SYR 50ML LL SCALE MARK (SYRINGE) ×3 IMPLANT
TOWEL OR 17X26 10 PK STRL BLUE (TOWEL DISPOSABLE) ×6 IMPLANT
TOWER CARTRIDGE SMART MIX (DISPOSABLE) ×3 IMPLANT
TRAY FOLEY CATH 14FRSI W/METER (CATHETERS) ×3 IMPLANT
WATER STERILE IRR 1500ML POUR (IV SOLUTION) ×3 IMPLANT
WRAP KNEE MAXI GEL POST OP (GAUZE/BANDAGES/DRESSINGS) ×3 IMPLANT

## 2013-10-30 NOTE — Anesthesia Procedure Notes (Signed)
Procedure Name: Intubation Date/Time: 10/30/2013 4:59 PM Performed by: Ofilia Neas Pre-anesthesia Checklist: Patient identified, Timeout performed, Emergency Drugs available, Suction available and Patient being monitored Patient Re-evaluated:Patient Re-evaluated prior to inductionOxygen Delivery Method: Circle system utilized Preoxygenation: Pre-oxygenation with 100% oxygen Intubation Type: IV induction Ventilation: Mask ventilation without difficulty Laryngoscope Size: Mac and 4 Grade View: Grade II Tube type: Oral Tube size: 7.5 mm Number of attempts: 1 Airway Equipment and Method: Stylet Placement Confirmation: ETT inserted through vocal cords under direct vision,  positive ETCO2 and breath sounds checked- equal and bilateral Secured at: 21 cm Tube secured with: Tape Dental Injury: Teeth and Oropharynx as per pre-operative assessment  Difficulty Due To: Difficulty was anticipated and Difficult Airway- due to anterior larynx

## 2013-10-30 NOTE — Anesthesia Postprocedure Evaluation (Signed)
  Anesthesia Post-op Note  Patient: Sharon Lowe  Procedure(s) Performed: Procedure(s) (LRB): LEFT  TOTAL  KNEE  REVISION (Left)  Patient Location: PACU  Anesthesia Type: General  Level of Consciousness: awake and alert   Airway and Oxygen Therapy: Patient Spontanous Breathing  Post-op Pain: mild  Post-op Assessment: Post-op Vital signs reviewed, Patient's Cardiovascular Status Stable, Respiratory Function Stable, Patent Airway and No signs of Nausea or vomiting  Last Vitals:  Filed Vitals:   10/30/13 1849  BP: 170/71  Pulse: 75  Temp: 37.1 C  Resp: 16    Post-op Vital Signs: stable   Complications: No apparent anesthesia complications

## 2013-10-30 NOTE — Anesthesia Preprocedure Evaluation (Signed)
Anesthesia Evaluation  Patient identified by MRN, date of birth, ID band Patient awake    Reviewed: Allergy & Precautions, H&P , NPO status , Patient's Chart, lab work & pertinent test results  Airway Mallampati: II TM Distance: >3 FB Neck ROM: full    Dental no notable dental hx. (+) Teeth Intact, Dental Advisory Given   Pulmonary sleep apnea , Current Smoker,  breath sounds clear to auscultation  Pulmonary exam normal       Cardiovascular Exercise Tolerance: Good hypertension, Pt. on medications Rhythm:regular Rate:Normal  CP 10/21/13. 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 negative neurological ROS  negative psych ROS   GI/Hepatic negative GI ROS, Neg liver ROS, GERD-  Medicated and Controlled,  Endo/Other  diabetes, Well Controlled, Type 2, Oral Hypoglycemic AgentsHypothyroidism   Renal/GU negative Renal ROS  negative genitourinary   Musculoskeletal   Abdominal   Peds  Hematology negative hematology ROS (+)   Anesthesia Other Findings   Reproductive/Obstetrics negative OB ROS                           Anesthesia Physical Anesthesia Plan  ASA: III  Anesthesia Plan: General   Post-op Pain Management:    Induction: Intravenous  Airway Management Planned: Oral ETT  Additional Equipment:   Intra-op Plan:   Post-operative Plan: Extubation in OR  Informed Consent: I have reviewed the patients History and Physical, chart, labs and discussed the procedure including the risks, benefits and alternatives for the proposed anesthesia with the patient or authorized representative who has indicated his/her understanding and acceptance.   Dental Advisory Given  Plan Discussed with: CRNA and Surgeon  Anesthesia Plan Comments:         Anesthesia Quick Evaluation

## 2013-10-30 NOTE — Transfer of Care (Signed)
Immediate Anesthesia Transfer of Care Note  Patient: Sharon Lowe  Procedure(s) Performed: Procedure(s): LEFT  TOTAL  KNEE  REVISION (Left)  Patient Location: PACU  Anesthesia Type:General  Level of Consciousness: awake, alert , oriented, patient cooperative and responds to stimulation  Airway & Oxygen Therapy: Patient Spontanous Breathing and Patient connected to face mask oxygen  Post-op Assessment: Report given to PACU RN, Post -op Vital signs reviewed and stable and Patient moving all extremities  Post vital signs: Reviewed and stable  Complications: No apparent anesthesia complications

## 2013-10-30 NOTE — Brief Op Note (Signed)
10/30/2013  6:30 PM  PATIENT:  Sharon Lowe  68 y.o. 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  PROCEDURE:  Procedure(s): LEFT  TOTAL  KNEE  REVISION (Left)  SURGEON:  Surgeon(s) and Role:    * Mauri Pole, MD - Primary  PHYSICIAN ASSISTANT:  Danae Orleans, PA-C   ANESTHESIA:   general  EBL:  Total I/O In: -  Out: 100 [Urine:100]  BLOOD ADMINISTERED:none  DRAINS: none   LOCAL MEDICATIONS USED:  Exparel, marcaine combination  SPECIMEN:  No Specimen  DISPOSITION OF SPECIMEN:  N/A  COUNTS:  YES  TOURNIQUET:   Total Tourniquet Time Documented: Thigh (Left) - 31 minutes Total: Thigh (Left) - 31 minutes   DICTATION: .Other Dictation: Dictation Number [id number]  PLAN OF CARE: Admit to inpatient   PATIENT DISPOSITION:  PACU - hemodynamically stable.   Delay start of Pharmacological VTE agent (>24hrs) due to surgical blood loss or risk of bleeding: no

## 2013-10-30 NOTE — Interval H&P Note (Signed)
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 After consideration of risks, benefits and other options for treatment, the patient has consented to  Procedure(s): PARTIAL VS TOTAL PATELLECTOMY PLUS A POLYETHYLENE REVISION LEFT SIDE (Left) as a surgical intervention .  The patient's history has been reviewed, patient examined, no change in status, stable for surgery.  I have reviewed the patient's chart and labs.  Questions were answered to the patient's satisfaction.     Mauri Pole

## 2013-10-31 ENCOUNTER — Encounter (HOSPITAL_COMMUNITY): Payer: Self-pay | Admitting: Orthopedic Surgery

## 2013-10-31 DIAGNOSIS — E876 Hypokalemia: Secondary | ICD-10-CM | POA: Diagnosis not present

## 2013-10-31 DIAGNOSIS — D500 Iron deficiency anemia secondary to blood loss (chronic): Secondary | ICD-10-CM | POA: Diagnosis not present

## 2013-10-31 DIAGNOSIS — E663 Overweight: Secondary | ICD-10-CM | POA: Diagnosis present

## 2013-10-31 DIAGNOSIS — E871 Hypo-osmolality and hyponatremia: Secondary | ICD-10-CM | POA: Diagnosis not present

## 2013-10-31 LAB — CBC
HEMATOCRIT: 30.3 % — AB (ref 36.0–46.0)
Hemoglobin: 10 g/dL — ABNORMAL LOW (ref 12.0–15.0)
MCH: 31.4 pg (ref 26.0–34.0)
MCHC: 33 g/dL (ref 30.0–36.0)
MCV: 95.3 fL (ref 78.0–100.0)
Platelets: 443 10*3/uL — ABNORMAL HIGH (ref 150–400)
RBC: 3.18 MIL/uL — AB (ref 3.87–5.11)
RDW: 13.8 % (ref 11.5–15.5)
WBC: 17.5 10*3/uL — ABNORMAL HIGH (ref 4.0–10.5)

## 2013-10-31 LAB — GLUCOSE, CAPILLARY
GLUCOSE-CAPILLARY: 153 mg/dL — AB (ref 70–99)
Glucose-Capillary: 145 mg/dL — ABNORMAL HIGH (ref 70–99)
Glucose-Capillary: 193 mg/dL — ABNORMAL HIGH (ref 70–99)

## 2013-10-31 LAB — BASIC METABOLIC PANEL
BUN: 20 mg/dL (ref 6–23)
CALCIUM: 8.3 mg/dL — AB (ref 8.4–10.5)
CO2: 25 meq/L (ref 19–32)
Chloride: 101 mEq/L (ref 96–112)
Creatinine, Ser: 0.93 mg/dL (ref 0.50–1.10)
GFR calc Af Amer: 72 mL/min — ABNORMAL LOW (ref >90)
GFR calc non Af Amer: 62 mL/min — ABNORMAL LOW (ref >90)
GLUCOSE: 125 mg/dL — AB (ref 70–99)
Potassium: 3.4 mEq/L — ABNORMAL LOW (ref 3.7–5.3)
Sodium: 136 mEq/L — ABNORMAL LOW (ref 137–147)

## 2013-10-31 MED ORDER — NICOTINE 14 MG/24HR TD PT24
14.0000 mg | MEDICATED_PATCH | Freq: Every day | TRANSDERMAL | Status: DC
  Administered 2013-10-31 – 2013-11-02 (×3): 14 mg via TRANSDERMAL
  Filled 2013-10-31 (×3): qty 1

## 2013-10-31 MED ORDER — POTASSIUM CHLORIDE CRYS ER 20 MEQ PO TBCR
20.0000 meq | EXTENDED_RELEASE_TABLET | Freq: Two times a day (BID) | ORAL | Status: DC
  Administered 2013-10-31 (×2): 20 meq via ORAL
  Filled 2013-10-31 (×5): qty 1

## 2013-10-31 NOTE — Progress Notes (Signed)
   Subjective: 1 Day Post-Op Procedure(s) (LRB): LEFT  TOTAL  KNEE  REVISION (Left)   Patient reports pain as mild, pain controlled. No events throughout the night. Denies any CP or SOB.   Objective:   VITALS:   Filed Vitals:   10/31/13  BP: 119/73  Pulse: 64  Temp: 98.6 F (37 C)   Resp: 17    Neurovascular intact Dorsiflexion/Plantar flexion intact Incision: dressing C/D/I No cellulitis present Compartment soft  LABS  Recent Labs  10/31/13 0516  HGB 10.0*  HCT 30.3*  WBC 17.5*  PLT 443*     Recent Labs  10/31/13 0516  NA 136*  K 3.4*  BUN 20  CREATININE 0.93  GLUCOSE 125*     Assessment/Plan: 1 Day Post-Op Procedure(s) (LRB): LEFT  TOTAL  KNEE  REVISION (Left) Foley cath d/c'ed Advance diet Up with therapy D/C IV fluids Discharge to SNF eventually, when ready  Expected ABLA  Treated with iron and will observe  Overweight (BMI 25-29.9) Estimated body mass index is 26.93 kg/(m^2) as calculated from the following:   Height as of this encounter: 5\' 7"  (1.702 m).   Weight as of this encounter: 78 kg (171 lb 15.3 oz). Patient also counseled that weight may inhibit the healing process Patient counseled that losing weight will help with future health issues  Hyponatremia Treated with IV fluids and will observe  Hypokalemia Treated with oral potassium and will observe      West Pugh. Cherese Lozano   PAC  10/31/2013, 9:51 AM

## 2013-10-31 NOTE — Progress Notes (Signed)
Utilization review completed.  

## 2013-10-31 NOTE — Progress Notes (Signed)
OT Cancellation Note  Patient Details Name: ROSEMOND LYTTLE MRN: [id number] DOB: [birth year]   Cancelled Treatment:    Reason Eval/Treat Not Completed: Other (comment) Note plan for SNF. Will defer OT eval to SNF.   Alycia Patten Alfretta Pinch [phone] 10/31/2013, 1:17 PM

## 2013-10-31 NOTE — Progress Notes (Signed)
Clinical Social Work Department BRIEF PSYCHOSOCIAL ASSESSMENT 10/31/2013  Patient:  Sharon Lowe, Sharon Lowe     Account Number:  [id number]     Admit date:  10/30/2013  Clinical Social Worker:  Lacie Scotts  Date/Time:  10/31/2013 08:57 AM  Referred by:  Physician  Date Referred:  10/31/2013 Referred for  SNF Placement   Other Referral:   Interview type:  Patient Other interview type:    PSYCHOSOCIAL DATA Living Status:  ALONE Admitted from facility:   Level of care:   Primary support name:  Johny Sax Primary support relationship to patient:  CHILD, ADULT Degree of support available:   unclear    CURRENT CONCERNS Current Concerns  Post-Acute Placement   Other Concerns:    SOCIAL WORK ASSESSMENT / PLAN Pt is a 68 yr old female living at home prior to hospitalization. CSW met with pt to assist with d/c planning. This is a planned admission. Pt has made prior arrangements to have ST Rehab at [facility] ) following hospital d/c. CSW has contacted SNF and is awaiting confirmation of d/c plan.   Assessment/plan status:  Psychosocial Support/Ongoing Assessment of Needs Other assessment/ plan:   Information/referral to community resources:   Insurance coverage for SNF and ambulance transport reviewed.    PATIENT'S/FAMILY'S RESPONSE TO PLAN OF CARE: Pt managed to sleep a little during the night. Her pain is controlled. Pt is looking forward to feeling better and going to [facility] for rehab.   Werner Lean LCSW [phone]

## 2013-10-31 NOTE — Progress Notes (Signed)
Inpatient Diabetes Program Recommendations  AACE/ADA: New Consensus Statement on Inpatient Glycemic Control (2013)  Target Ranges:  Prepandial:   less than 140 mg/dL      Peak postprandial:   less than 180 mg/dL (1-2 hours)      Critically ill patients:  140 - 180 mg/dL   Reason for Visit: Hyperglycemia  Diabetes history: DM2 Outpatient Diabetes medications: metformin 500 bid Current orders for Inpatient glycemic control: Novolog sensitive tidwc, metformin 500 bid  Results for Sharon Lowe, Sharon Lowe (MRN [id number]) as of 10/31/2013 13:08  Ref. Range 10/30/2013 21:56 10/31/2013 07:24 10/31/2013 11:15  Glucose-Capillary Latest Range: 70-99 mg/dL 264 (H) 145 (H) 193 (H)    Inpatient Diabetes Program Recommendations Correction (SSI): Increase Novolog to resistant tidwc and hs  Will follow.  Thank you. Lorenda Peck, RD, LDN, CDE Inpatient Diabetes Coordinator [phone]

## 2013-10-31 NOTE — Progress Notes (Signed)
Physical Therapy Treatment Note   10/31/13 1600  PT Visit Information  Last PT Received On 10/31/13  Assistance Needed +1  History of Present Illness Pt is a 68 year old female s/p L TKA revision.  PT Time Calculation  PT Start Time 1409  PT Stop Time 1423  PT Time Calculation (min) 14 min  Subjective Data  Subjective Pt ambulated again in hallway then assisted back to bed.  Precautions  Precautions Knee  Restrictions  Other Position/Activity Restrictions WBAT  Cognition  Arousal/Alertness Awake/alert  Behavior During Therapy WFL for tasks assessed/performed  Overall Cognitive Status Within Functional Limits for tasks assessed  Bed Mobility  Overal bed mobility Needs Assistance  Bed Mobility Supine to Sit;Sit to Supine  Supine to sit Supervision  Sit to supine Supervision  General bed mobility comments verbal cues for self assist  Transfers  Overall transfer level Needs assistance  Equipment used Rolling walker (2 wheeled)  Transfers Sit to/from Stand  Sit to Stand Min guard  General transfer comment verbal cues for safe technique  Ambulation/Gait  Ambulation/Gait assistance Min guard  Ambulation Distance (Feet) 80 Feet  Assistive device Rolling walker (2 wheeled)  Gait Pattern/deviations Step-to pattern;Antalgic;Decreased step length - left  General Gait Details verbal cues for sequence, step length, RW distance  PT - End of Session  Activity Tolerance Patient limited by pain;Patient limited by fatigue  Patient left in bed;with call bell/phone within reach  PT - Assessment/Plan  PT Plan Current plan remains appropriate  PT Frequency 7X/week  Follow Up Recommendations SNF  PT equipment None recommended by PT  PT Goal Progression  Progress towards PT goals Progressing toward goals  PT General Charges  $$ ACUTE PT VISIT 1 Procedure  PT Treatments  $Gait Training 8-22 mins   Pt reports a little more pain this afternoon however just received pain meds.  Activity to  tolerance.  Carmelia Bake, PT, DPT 10/31/2013 Pager: [phone]

## 2013-10-31 NOTE — Progress Notes (Signed)
CARE MANAGEMENT NOTE 10/31/2013  Patient:  Sharon Lowe, Sharon Lowe   Account Number:  [id number]  Date Initiated:  10/31/2013  Documentation initiated by:  DAVIS,RHONDA  Subjective/Objective Assessment:   left total knee revision     Action/Plan:   home with hhc   Anticipated DC Date:  11/03/2013   Anticipated DC Plan:  SKILLED NURSING FACILITY  In-house referral  Clinical Social Worker      DC Planning Services  CM consult      Choice offered to / List presented to:  C-1 Patient      DME agency  [place]   Status of service:  In process, will continue to follow Medicare Important Message given?  NA - LOS <3 / Initial given by admissions (If response is "NO", the following Medicare IM given date fields will be blank) Date Medicare IM given:  10/24/2013 Date Additional Medicare IM given:    Discharge Disposition:    Per UR Regulation:  Reviewed for med. necessity/level of care/duration of stay  If discussed at [place] of Stay Meetings, dates discussed:    Comments:  [id number]/Rhonda Eldridge Dace, [place], [place] [phone] Chart Reviewed for discharge and hospital needs. Discharge needs at time of review: None present will follow for needs. Review of patient progress due on [id number]

## 2013-10-31 NOTE — Op Note (Signed)
NAMETEONA, Sharon Lowe NO.:  [id number]  MEDICAL RECORD NO.:  [id number]  LOCATION:  7353                         FACILITY:  [facility]  PHYSICIAN:  Pietro Cassis. Alvan Dame, M.D.  DATE OF BIRTH:  03-27-1946  DATE OF PROCEDURE:  10/30/2013 DATE OF DISCHARGE:                              OPERATIVE REPORT   PREOPERATIVE DIAGNOSIS:  Failed left total knee arthroplasty.  POSTOPERATIVE DIAGNOSIS:  Failed left total knee arthroplasty.  PROCEDURE:  Revision left total knee arthroplasty utilizing [employer] Stryker knee components for a Scorpio knee with a size #9 x 18 mm posterior stabilized insert and a size 8 mm size 5 patella button.  SURGEON:  Pietro Cassis. Alvan Dame, M.D.  ASSISTANT:  Adrian Prince, PA-C.  Note that Mr. Guinevere Scarlet was present for the entirety of the case from preoperative position to perioperative management of the operative extremity, general facilitation of the case, and  primary wound closure.  ANESTHESIA:  General.  SPECIMENS:  None.  COMPLICATIONS:  None.  TOURNIQUET:  30 minutes at 250 mmHg.  DRAINS:  None.  INDICATION FOR PROCEDURE:  Ms. Schicker is a 68 year old female with history of index left total knee arthroplasty in 2004.  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  She had been doing reasonably well with her knee, but had persistent pain in anterior aspect of knee with noted weakness.  When she was seen and evaluated office as a second opinion for her left knee, she noted not only anterior knee pain but was noted to have an extensor lag with pain with active extension.  Examination and subsequent radiographic evaluation including a sunrise or short view indicated a significantly lateral subluxed patella with significant deformity to the patella, almost as if shape formed a conform shape around the lateral femoral condyle of the component or lateral  trochlear component portion of the femoral component.  After reviewing these findings with Ms. Newport, we discussed surgical options including partial versus total patellectomy versus revision knee surgery.  Risks and benefits were discussed including the risks of persistent weakness with her knee, recurrence of the subluxation related to the chronicity of her condition as well as infection DVT.  Postop course was reviewed and discussed.  Consent was obtained for benefit of pain relief.  PROCEDURE:  The patient was brought to operative theater.  Once adequate anesthesia, preoperative antibiotics, Ancef administered, she was positioned supine.  A left thigh tourniquet placed, left lower extremity was then prepped and draped in sterile fashion.  Time-out was performed identifying the patient, planned procedure and extremity.  Leg was exsanguinated.  The tourniquet elevated to 250 mmHg.  I used a portion of her previous incision excising soft tissues and skin.  Soft tissue planes created encountering this significantly laterally subluxed patella.  Upon soft tissue dissection exposure, the median arthrotomy was made encountering clear synovial fluid.  Following initial exposure including synovectomy medially and laterally, I evaluated the patella.  I was able to evert the patella for further exposure, evaluating the deformity that was present.  Performing soft tissue release and removing soft  tissues, I removed a significant portion the lateral aspect of this deformed patella, still with what appeared to be an anatomically normal patella.  From previous surgical procedures, the lowest point of the patella was 12 mm.  I was able to resect down to normal bone removing the deformed bone down to 14 mm.  I thus made the decision to go ahead and proceed with patellar revision as opposed to a patellectomy.  We selected the size 5 dome drilled holes and placed the 8 mm dome into here and found  that with the use of towel clips, I was able to maintain the patella in its correct trochlear groove.  There is significant laxity and redundancy to her lateral structures based on the chronicity of this lateral subluxation.  At this point, I also made the decision to exchange out her polyethylene.  It  had been in for 10 years.  We removed her previous polyethylene which was noted to be a size 12 insert and I upsized with size 18 insert and I did this to improve the stability of her knee, but also to apply further tension on this extensor mechanism to prevent further subluxation concerns.  The new size #9 x 18 mm posterior stabilized insert was then impacted onto the tibial tray following irrigation and debridement of posterior aspect knee.  At this point, attention was directed to closure.  Utilizing a horizontal mattress type pattern, I was able reapproximate the extensor mechanism to the medial retinacular tissues by significantly inverting the tissues and thus shortening these muscle distance.  I placed approximately 8 of these from the proximal around the patella and to the inferior patellar region.  I then used a #1 Vicryl in the horizontal mattress to finish closure distally and proximally and then ran a #0 V- Loc suture over the entire extent producing basically a pant over vest type closure.  Trialing during this time, I found that this maintained the patella in its correct orientation to the trochlear groove satisfying all aspects of the procedure.  At this point, we reapproximated the skin and subcutaneous layer using 2- 0 Vicryl and staples on the skin based on extremely thin skin.  The knee was then cleaned, dried, and dressed sterilely using the sterile bulky wrap.  She was brought to recovery room in stable condition tolerating the procedure well.  Findings reviewed with family. She will be in the hospital for 1-3 days pending on discharge  disposition.     Pietro Cassis Alvan Dame, M.D.     MDO/MEDQ  D:  10/30/2013  T:  10/31/2013  Job:  [id number]

## 2013-10-31 NOTE — Progress Notes (Signed)
Clinical Social Work Department CLINICAL SOCIAL WORK PLACEMENT NOTE 10/31/2013  Patient:  Sharon Lowe, Sharon Lowe  Account Number:  [id number] Admit date:  10/30/2013  Clinical Social Worker:  Werner Lean, LCSW  Date/time:  10/31/2013 09:07 AM  Clinical Social Work is seeking post-discharge placement for this patient at the following level of care:   SKILLED NURSING   (*CSW will update this form in Epic as items are completed)     Patient/family provided with [place] Department of Clinical Social Work's list of facilities offering this level of care within the geographic area requested by the patient (or if unable, by the patient's family).  10/31/2013  Patient/family informed of their freedom to choose among providers that offer the needed level of care, that participate in Medicare, Medicaid or managed care program needed by the patient, have an available bed and are willing to accept the patient.    Patient/family informed of [facility]' ownership interest in [facility], as well as of the fact that they are under no obligation to receive care at this facility.  PASARR submitted to EDS on 10/30/2013 PASARR number received from EDS on 10/30/2013  FL2 transmitted to all facilities in geographic area requested by pt/family on  10/31/2013 FL2 transmitted to all facilities within larger geographic area on   Patient informed that his/her managed care company has contracts with or will negotiate with  certain facilities, including the following:     Patient/family informed of bed offers received:   Patient chooses bed at  Physician recommends and patient chooses bed at    Patient to be transferred to  on   Patient to be transferred to facility by   The following physician request were entered in Epic:   Additional Comments:  Werner Lean LCSW [phone]

## 2013-10-31 NOTE — Evaluation (Signed)
Physical Therapy Evaluation Patient Details Name: Sharon Lowe MRN: [id number] DOB: 1945-12-01 Today's Date: 10/31/2013   History of Present Illness  Pt is a 68 year old female s/p L TKA revision.  Clinical Impression  Pt is s/p L TKA resulting in the deficits listed below (see PT Problem List).  Pt will benefit from skilled PT to increase their independence and safety with mobility to allow discharge to the venue listed below.  Pt mobilizing well POD #1 and plans to d/c to SNF since she lives alone.     Follow Up Recommendations SNF    Equipment Recommendations  None recommended by PT    Recommendations for Other Services       Precautions / Restrictions Precautions Precautions: Knee Restrictions Other Position/Activity Restrictions: WBAT      Mobility  Bed Mobility Overal bed mobility: Needs Assistance Bed Mobility: Supine to Sit     Supine to sit: Min assist     General bed mobility comments: assist for L LE  Transfers Overall transfer level: Needs assistance Equipment used: Rolling walker (2 wheeled) Transfers: Sit to/from Stand Sit to Stand: Min assist         General transfer comment: verbal cues for technique, assist to steady  Ambulation/Gait Ambulation/Gait assistance: Min guard Ambulation Distance (Feet): 160 Feet Assistive device: Rolling walker (2 wheeled) Gait Pattern/deviations: Step-to pattern;Antalgic;Decreased step length - left     General Gait Details: verbal cues for sequence, RW distance, step length  Stairs            Wheelchair Mobility    Modified Rankin (Stroke Patients Only)       Balance                                             Pertinent Vitals/Pain Premedicated, activity to tolerance, ice packs applied    Home Living Family/patient expects to be discharged to:: Skilled nursing facility Living Arrangements: Alone                    Prior Function Level of Independence:  Independent         Comments: states she used cane for stairs but no assistive device for ambulation     Hand Dominance        Extremity/Trunk Assessment               Lower Extremity Assessment: LLE deficits/detail   LLE Deficits / Details: fair quad contraction, AAROM L knee -10-60*     Communication   Communication: No difficulties  Cognition Arousal/Alertness: Awake/alert Behavior During Therapy: WFL for tasks assessed/performed Overall Cognitive Status: Within Functional Limits for tasks assessed                      General Comments      Exercises Total Joint Exercises Ankle Circles/Pumps: AROM;Both;15 reps Quad Sets: AROM;Both;15 reps Towel Squeeze: AROM;Both;15 reps Short Arc Quad: AROM;Left;15 reps Heel Slides: AAROM;Left;15 reps Hip ABduction/ADduction: AROM;Left;15 reps      Assessment/Plan    PT Assessment Patient needs continued PT services  PT Diagnosis Difficulty walking;Acute pain   PT Problem List Decreased strength;Decreased activity tolerance;Decreased mobility;Decreased range of motion;Decreased knowledge of precautions;Pain  PT Treatment Interventions Gait training;DME instruction;Functional mobility training;Patient/family education;Therapeutic activities;Therapeutic exercise   PT Goals (Current goals can be found in the Care Plan section) Acute  Rehab PT Goals PT Goal Formulation: With patient Time For Goal Achievement: 11/04/13 Potential to Achieve Goals: Good    Frequency 7X/week   Barriers to discharge        Co-evaluation               End of Session   Activity Tolerance: Patient tolerated treatment well Patient left: in chair;with call bell/phone within reach           Time: 1018-1043 PT Time Calculation (min): 25 min   Charges:   PT Evaluation $Initial PT Evaluation Tier I: 1 Procedure PT Treatments $Gait Training: 8-22 mins $Therapeutic Exercise: 8-22 mins   PT G Codes:           Junius Argyle 10/31/2013, 1:23 PM Carmelia Bake, PT, DPT 10/31/2013 Pager: [phone]

## 2013-11-01 LAB — BASIC METABOLIC PANEL
BUN: 19 mg/dL (ref 6–23)
CHLORIDE: 102 meq/L (ref 96–112)
CO2: 24 mEq/L (ref 19–32)
Calcium: 8.4 mg/dL (ref 8.4–10.5)
Creatinine, Ser: 0.85 mg/dL (ref 0.50–1.10)
GFR calc non Af Amer: 69 mL/min — ABNORMAL LOW (ref >90)
GFR, EST AFRICAN AMERICAN: 80 mL/min — AB (ref >90)
Glucose, Bld: 178 mg/dL — ABNORMAL HIGH (ref 70–99)
Potassium: 5 mEq/L (ref 3.7–5.3)
SODIUM: 136 meq/L — AB (ref 137–147)

## 2013-11-01 LAB — GLUCOSE, CAPILLARY
GLUCOSE-CAPILLARY: 163 mg/dL — AB (ref 70–99)
GLUCOSE-CAPILLARY: 257 mg/dL — AB (ref 70–99)
Glucose-Capillary: 156 mg/dL — ABNORMAL HIGH (ref 70–99)
Glucose-Capillary: 153 mg/dL — ABNORMAL HIGH (ref 70–99)
Glucose-Capillary: 215 mg/dL — ABNORMAL HIGH (ref 70–99)

## 2013-11-01 LAB — CBC
HCT: 27.7 % — ABNORMAL LOW (ref 36.0–46.0)
Hemoglobin: 9.1 g/dL — ABNORMAL LOW (ref 12.0–15.0)
MCH: 31.3 pg (ref 26.0–34.0)
MCHC: 32.9 g/dL (ref 30.0–36.0)
MCV: 95.2 fL (ref 78.0–100.0)
PLATELETS: 397 10*3/uL (ref 150–400)
RBC: 2.91 MIL/uL — AB (ref 3.87–5.11)
RDW: 14.1 % (ref 11.5–15.5)
WBC: 12 10*3/uL — AB (ref 4.0–10.5)

## 2013-11-01 NOTE — Progress Notes (Signed)
Physical Therapy Treatment Note   11/01/13 1500  PT Visit Information  Last PT Received On 11/01/13  Assistance Needed +1  History of Present Illness Pt is a 68 year old female s/p L TKA revision.  PT Time Calculation  PT Start Time 1512  PT Stop Time 1524  PT Time Calculation (min) 12 min  Subjective Data  Subjective Pt ambulated in hallway then assisted back to bed.  Plans to d/c to SNF tomorrow.  Precautions  Precautions Knee  Restrictions  Other Position/Activity Restrictions WBAT  Cognition  Arousal/Alertness Awake/alert  Behavior During Therapy WFL for tasks assessed/performed  Overall Cognitive Status Within Functional Limits for tasks assessed  Bed Mobility  Overal bed mobility Needs Assistance  Bed Mobility Supine to Sit;Sit to Supine  Supine to sit Supervision  Sit to supine Min assist  General bed mobility comments assist for LE onto bed due to fatigue  Transfers  Overall transfer level Needs assistance  Equipment used Rolling walker (2 wheeled)  Transfers Sit to/from Stand  Sit to Stand Supervision  Ambulation/Gait  Ambulation/Gait assistance Supervision  Ambulation Distance (Feet) 200 Feet  Assistive device Rolling walker (2 wheeled)  Gait Pattern/deviations Step-through pattern;Step-to pattern;Antalgic  General Gait Details progressed to step through pattern, cues for heel strike  PT - End of Session  Activity Tolerance Patient tolerated treatment well  Patient left in bed;with call bell/phone within reach  PT - Assessment/Plan  PT Plan Current plan remains appropriate  PT Frequency 7X/week  Follow Up Recommendations SNF  PT equipment None recommended by PT  PT Goal Progression  Progress towards PT goals Progressing toward goals  PT General Charges  $$ ACUTE PT VISIT 1 Procedure  PT Treatments  $Gait Training 8-22 mins   Carmelia Bake, PT, DPT 11/01/2013 Pager: [phone]

## 2013-11-01 NOTE — Progress Notes (Signed)
   Subjective: 2 Days Post-Op Procedure(s) (LRB): LEFT  TOTAL  KNEE  REVISION (Left)   Patient reports pain as mild, pain controlled with medication. No events throughout the night. Denies any CP or SOB.  Objective:   VITALS:   Filed Vitals:   11/01/13 0625  BP: 148/84  Pulse: 62  Temp: 98.2 F (36.8 C)  Resp: 16    Neurovascular intact Dorsiflexion/Plantar flexion intact Incision: dressing C/D/I No cellulitis present Compartment soft  LABS  Recent Labs  10/31/13 0516 11/01/13 0415  HGB 10.0* 9.1*  HCT 30.3* 27.7*  WBC 17.5* 12.0*  PLT 443* 397     Recent Labs  10/31/13 0516 11/01/13 0415  NA 136* 136*  K 3.4* 5.0  BUN 20 19  CREATININE 0.93 0.85  GLUCOSE 125* 178*     Assessment/Plan: 2 Days Post-Op Procedure(s) (LRB): LEFT  TOTAL  KNEE  REVISION (Left) Up with therapy Discharge to SNF ([facility]) eventually when ready  Overweight (BMI 25-29.9)  Estimated body mass index is 26.93 kg/(m^2) as calculated from the following:      Height as of this encounter: 5\' 7"  (1.702 m).      Weight as of this encounter: 78 kg (171 lb 15.3 oz).  Patient also counseled that weight may inhibit the healing process  Patient counseled that losing weight will help with future health issues   Hyponatremia  Treated with IV fluids and will observe   West Pugh. Luellen Howson   PAC  11/01/2013, 8:33 AM

## 2013-11-01 NOTE — Progress Notes (Signed)
Physical Therapy Treatment Patient Details Name: Sharon Lowe MRN: [id number] DOB: Mar 20, 1946 Today's Date: 11/01/2013    History of Present Illness Pt is a 68 year old female s/p L TKA revision.    PT Comments    Pt reports being a little stiff in L LE this morning however improved during ambulation.  Pt also performed exercises in recliner.  Follow Up Recommendations  SNF     Equipment Recommendations  None recommended by PT    Recommendations for Other Services       Precautions / Restrictions Precautions Precautions: Knee Restrictions Other Position/Activity Restrictions: WBAT    Mobility  Bed Mobility Overal bed mobility: Needs Assistance Bed Mobility: Supine to Sit;Sit to Supine     Supine to sit: Min assist     General bed mobility comments: assist today due to stiff LE  Transfers Overall transfer level: Needs assistance Equipment used: Rolling walker (2 wheeled) Transfers: Sit to/from Stand Sit to Stand: Min guard         General transfer comment: verbal cues for safe technique  Ambulation/Gait Ambulation/Gait assistance: Min guard;Supervision Ambulation Distance (Feet): 200 Feet Assistive device: Rolling walker (2 wheeled) Gait Pattern/deviations: Step-to pattern;Antalgic;Decreased step length - left     General Gait Details: verbal cues for sequence, step length, RW distance   Stairs            Wheelchair Mobility    Modified Rankin (Stroke Patients Only)       Balance                                    Cognition Arousal/Alertness: Awake/alert Behavior During Therapy: WFL for tasks assessed/performed Overall Cognitive Status: Within Functional Limits for tasks assessed                      Exercises Total Joint Exercises Ankle Circles/Pumps: AROM;Both;15 reps Quad Sets: AROM;Both;15 reps Towel Squeeze: AROM;Both;15 reps Short Arc Quad: AROM;Left;15 reps Heel Slides: AAROM;Left;15 reps Hip  ABduction/ADduction: AROM;Left;15 reps    General Comments        Pertinent Vitals/Pain Premedicated, activity to tolerance, ice packs applied    Home Living                      Prior Function            PT Goals (current goals can now be found in the care plan section) Progress towards PT goals: Progressing toward goals    Frequency  7X/week    PT Plan Current plan remains appropriate    Co-evaluation             End of Session   Activity Tolerance: Patient tolerated treatment well Patient left: in chair;with call bell/phone within reach     Time: 3785-8850 PT Time Calculation (min): 27 min  Charges:  $Gait Training: 8-22 mins $Therapeutic Exercise: 8-22 mins                    G Codes:      Junius Argyle 11/01/2013, 12:21 PM Carmelia Bake, PT, DPT 11/01/2013 Pager: [phone]

## 2013-11-02 DIAGNOSIS — S8990XA Unspecified injury of unspecified lower leg, initial encounter: Secondary | ICD-10-CM | POA: Diagnosis not present

## 2013-11-02 DIAGNOSIS — M25569 Pain in unspecified knee: Secondary | ICD-10-CM | POA: Diagnosis not present

## 2013-11-02 DIAGNOSIS — I6990 Unspecified sequelae of unspecified cerebrovascular disease: Secondary | ICD-10-CM | POA: Diagnosis not present

## 2013-11-02 DIAGNOSIS — N390 Urinary tract infection, site not specified: Secondary | ICD-10-CM | POA: Diagnosis not present

## 2013-11-02 DIAGNOSIS — E079 Disorder of thyroid, unspecified: Secondary | ICD-10-CM | POA: Diagnosis not present

## 2013-11-02 DIAGNOSIS — Z8673 Personal history of transient ischemic attack (TIA), and cerebral infarction without residual deficits: Secondary | ICD-10-CM | POA: Diagnosis not present

## 2013-11-02 DIAGNOSIS — S99919A Unspecified injury of unspecified ankle, initial encounter: Secondary | ICD-10-CM | POA: Diagnosis not present

## 2013-11-02 DIAGNOSIS — E663 Overweight: Secondary | ICD-10-CM | POA: Diagnosis not present

## 2013-11-02 DIAGNOSIS — E039 Hypothyroidism, unspecified: Secondary | ICD-10-CM | POA: Diagnosis not present

## 2013-11-02 DIAGNOSIS — F3289 Other specified depressive episodes: Secondary | ICD-10-CM | POA: Diagnosis not present

## 2013-11-02 DIAGNOSIS — E785 Hyperlipidemia, unspecified: Secondary | ICD-10-CM | POA: Diagnosis not present

## 2013-11-02 DIAGNOSIS — E119 Type 2 diabetes mellitus without complications: Secondary | ICD-10-CM | POA: Diagnosis not present

## 2013-11-02 DIAGNOSIS — D649 Anemia, unspecified: Secondary | ICD-10-CM | POA: Diagnosis not present

## 2013-11-02 DIAGNOSIS — IMO0001 Reserved for inherently not codable concepts without codable children: Secondary | ICD-10-CM | POA: Diagnosis not present

## 2013-11-02 DIAGNOSIS — K219 Gastro-esophageal reflux disease without esophagitis: Secondary | ICD-10-CM | POA: Diagnosis not present

## 2013-11-02 DIAGNOSIS — I10 Essential (primary) hypertension: Secondary | ICD-10-CM | POA: Diagnosis not present

## 2013-11-02 DIAGNOSIS — Z5189 Encounter for other specified aftercare: Secondary | ICD-10-CM | POA: Diagnosis not present

## 2013-11-02 DIAGNOSIS — Z471 Aftercare following joint replacement surgery: Secondary | ICD-10-CM | POA: Diagnosis not present

## 2013-11-02 DIAGNOSIS — E871 Hypo-osmolality and hyponatremia: Secondary | ICD-10-CM | POA: Diagnosis not present

## 2013-11-02 DIAGNOSIS — F329 Major depressive disorder, single episode, unspecified: Secondary | ICD-10-CM | POA: Diagnosis not present

## 2013-11-02 DIAGNOSIS — Z96659 Presence of unspecified artificial knee joint: Secondary | ICD-10-CM | POA: Diagnosis not present

## 2013-11-02 LAB — URINALYSIS, ROUTINE W REFLEX MICROSCOPIC
BILIRUBIN URINE: NEGATIVE
GLUCOSE, UA: 100 mg/dL — AB
Ketones, ur: NEGATIVE mg/dL
Nitrite: NEGATIVE
PROTEIN: NEGATIVE mg/dL
Specific Gravity, Urine: 1.014 (ref 1.005–1.030)
Urobilinogen, UA: 0.2 mg/dL (ref 0.0–1.0)
pH: 6 (ref 5.0–8.0)

## 2013-11-02 LAB — URINE MICROSCOPIC-ADD ON

## 2013-11-02 LAB — GLUCOSE, CAPILLARY
Glucose-Capillary: 136 mg/dL — ABNORMAL HIGH (ref 70–99)
Glucose-Capillary: 95 mg/dL (ref 70–99)

## 2013-11-02 MED ORDER — HYDROCODONE-ACETAMINOPHEN 7.5-325 MG PO TABS: 1.0000 | ORAL_TABLET | ORAL | Status: DC | PRN

## 2013-11-02 MED ORDER — DSS 100 MG PO CAPS: 100.0000 mg | ORAL_CAPSULE | Freq: Two times a day (BID) | ORAL | Status: DC

## 2013-11-02 MED ORDER — CIPROFLOXACIN HCL 500 MG PO TABS
500.0000 mg | ORAL_TABLET | Freq: Two times a day (BID) | ORAL | Status: DC
  Filled 2013-11-02 (×2): qty 1

## 2013-11-02 MED ORDER — FERROUS SULFATE 325 (65 FE) MG PO TABS: 325.0000 mg | ORAL_TABLET | Freq: Three times a day (TID) | ORAL | Status: DC

## 2013-11-02 MED ORDER — POLYETHYLENE GLYCOL 3350 17 G PO PACK: 17.0000 g | PACK | Freq: Two times a day (BID) | ORAL | Status: DC

## 2013-11-02 MED ORDER — ASPIRIN 325 MG PO TBEC: 325.0000 mg | DELAYED_RELEASE_TABLET | Freq: Two times a day (BID) | ORAL | Status: AC

## 2013-11-02 MED ORDER — TIZANIDINE HCL 4 MG PO CAPS: 4.0000 mg | ORAL_CAPSULE | Freq: Three times a day (TID) | ORAL | Status: DC | PRN

## 2013-11-02 NOTE — Progress Notes (Signed)
Pt d/c to [facility]

## 2013-11-02 NOTE — Progress Notes (Signed)
   Subjective: 3 Days Post-Op Procedure(s) (LRB): LEFT  TOTAL  KNEE  REVISION (Left)   Seen by Dr. Alvan Dame. Patient reports pain as mild, pain controlled. No events throughout the night. Ready to be discharged to SNF.  Objective:   VITALS:   Filed Vitals:   11/02/13  BP: 155/82  Pulse: 66  Temp: 98.8 F (37.1 C)   Resp: 16    Neurovascular intact Dorsiflexion/Plantar flexion intact Incision: dressing C/D/I No cellulitis present Compartment soft  LABS  Recent Labs  10/31/13 0516 11/01/13 0415  HGB 10.0* 9.1*  HCT 30.3* 27.7*  WBC 17.5* 12.0*  PLT 443* 397     Recent Labs  10/31/13 0516 11/01/13 0415  NA 136* 136*  K 3.4* 5.0  BUN 20 19  CREATININE 0.93 0.85  GLUCOSE 125* 178*     Assessment/Plan: 3 Days Post-Op Procedure(s) (LRB): LEFT  TOTAL  KNEE  REVISION (Left) UA obtained and ran to evaluate for UTI Up with therapy Discharge to SNF ([facility]) Follow up in 2 weeks at [facility] Follow up with OLIN,Von Quintanar D in 2 weeks.  Contact information:  [facility] [address] [place] [phone]    Expected ABLA  Treated with iron and will observe  Overweight (BMI 25-29.9) Estimated body mass index is 26.93 kg/(m^2) as calculated from the following:   Height as of this encounter: 5\' 7"  (1.702 m).   Weight as of this encounter: 78 kg (171 lb 15.3 oz). Patient also counseled that weight may inhibit the healing process Patient counseled that losing weight will help with future health issues        West Pugh. Willis Kuipers   PAC  11/02/2013, 8:36 AM

## 2013-11-02 NOTE — Discharge Summary (Signed)
Physician Discharge Summary  Patient ID: Sharon Lowe MRN: [id number] DOB/AGE: 07/26/1945 68 y.o.  Admit date: 10/30/2013 Discharge date:  11/02/2013  Procedures:  Procedure(s) (LRB): LEFT  TOTAL  KNEE  REVISION (Left)  Attending Physician:  Dr. Paralee Cancel   Admission Diagnoses:   Failed left total knee arthroplasty  Discharge Diagnoses:  Principal Problem:   S/P left knee revision Active Problems:   Expected blood loss anemia   Overweight (BMI 25.0-29.9)   Hyponatremia  Past Medical History  Diagnosis Date  . Diabetes mellitus   . Hypertension   . Hyperlipidemia   . Substance abuse   . Thyroid disease   . GERD (gastroesophageal reflux disease)   . Chest pain 10/21/13   . Stroke     mini strokes- 2000  . Sleep apnea     could not handle CPAP- 2012   . Hypothyroidism   . Depression   . Urinary tract infection 10/20/2013   . Kidney stones   . Arthritis     HPI: Pt is a 68 y.o. female complaining of left knee pain for since original TKA of the left knee. Dr. Tonita Cong did a left total knee arthroplasty in 2004 and a subsequent surgery to remove the patella button in 2006. Pain had continually increased since the beginning. X-rays in the clinic show a previous TKA with the patella component laterally displaced. Pt has tried various conservative treatments which have failed to alleviate their symptoms, including revision surgery, weight loss analgesic medications, PT and activity modification. Various options are discussed with the patient. Risks, benefits and expectations were discussed with the patient. Risks including but not limited to the risk of anesthesia, blood clots, nerve damage, blood vessel damage, failure of the prosthesis, infection and up to and including death. Patient understand the risks, benefits and expectations and wishes to proceed with surgery.   PCP: Mathews Argyle, MD   Discharged Condition: good  Hospital Course:  Patient underwent the above  stated procedure on 10/30/2013. Patient tolerated the procedure well and brought to the recovery room in good condition and subsequently to the floor.  POD #1 BP: 119/73 ; Pulse: 64 ; Temp:  8.6 F (37 C) ; Resp: 17  Patient reports pain as mild, pain controlled. No events throughout the night. Denies any CP or SOB.  Neurovascular intact, dorsiflexion/plantar flexion intact, incision: dressing C/D/I, no cellulitis present and compartment soft.   LABS  Basename    HGB  10.0  HCT  30.3   POD #2  BP: 148/84 ; Pulse: 62 ; Temp: 98.2 F (36.8 C) ; Resp: 16 Patient reports pain as mild, pain controlled with medication. No events throughout the night. Denies any CP or SOB. Neurovascular intact, dorsiflexion/plantar flexion intact, incision: dressing C/D/I, no cellulitis present and compartment soft.   LABS  Basename    HGB  9.1  HCT  27.7   POD #3  BP: 155/82 ; Pulse: 66 ; Temp: 98.8 F (37.1 C) ; Resp: 16  Patient reports pain as mild, pain controlled. No events throughout the night. Ready to be discharged to SNF. Neurovascular intact, dorsiflexion/plantar flexion intact, incision: dressing C/D/I, no cellulitis present and compartment soft.   LABS   No new labs  Discharge Exam: General appearance: alert, cooperative and no distress Extremities: Homans sign is negative, no sign of DVT, no edema, redness or tenderness in the calves or thighs and no ulcers, gangrene or trophic changes  Disposition:      Skilled  nursing facility  with follow up in 2 weeks   Follow-up Information   Follow up with Mauri Pole, MD. Schedule an appointment as soon as possible for a visit in 2 weeks.   Specialty:  Orthopedic Surgery   Contact information:   [address] [place] [address] [phone]       Discharge Instructions   Call MD / Call 911    Complete by:  As directed   If you experience chest pain or shortness of breath, CALL 911 and be transported to the hospital  emergency room.  If you develope a fever above 101 F, pus (white drainage) or increased drainage or redness at the wound, or calf pain, call your surgeon's office.     Change dressing    Complete by:  As directed   Maintain surgical dressing for 10-14 days, or until follow up in the clinic.     Constipation Prevention    Complete by:  As directed   Drink plenty of fluids.  Prune juice may be helpful.  You may use a stool softener, such as Colace (over the counter) 100 mg twice a day.  Use MiraLax (over the counter) for constipation as needed.     Diet - low sodium heart healthy    Complete by:  As directed      Discharge instructions    Complete by:  As directed   Maintain surgical dressing for 10-14 days, or until follow up in the clinic. Follow up in 2 weeks at [facility] Call with any questions or concerns.     Driving restrictions    Complete by:  As directed   No driving for 4 weeks     Increase activity slowly as tolerated    Complete by:  As directed      TED hose    Complete by:  As directed   Use stockings (TED hose) for 2 weeks on both leg(s).  You may remove them at night for sleeping.     Weight bearing as tolerated    Complete by:  As directed              Medication List    STOP taking these medications       acetaminophen 500 MG tablet  Commonly known as:  TYLENOL      TAKE these medications       ALLERGY 10 MG tablet  Generic drug:  loratadine  Take 10 mg by mouth daily.     aspirin 325 MG EC tablet  Take 1 tablet (325 mg total) by mouth 2 (two) times daily.     atorvastatin 10 MG tablet  Commonly known as:  LIPITOR  Take 10 mg by mouth every evening.     buPROPion 100 MG 12 hr tablet  Commonly known as:  WELLBUTRIN SR  Take 100 mg by mouth every morning.     CALCIUM-VITAMIN D PO  Take 1,200 mg by mouth daily.     doxycycline 100 MG DR capsule  Commonly known as:  DORYX  Take 100 mg by mouth 2 (two) times daily. For 7 days  patient started on 10/20/13 for urinary tract infection     DSS 100 MG Caps  Take 100 mg by mouth 2 (two) times daily.     ferrous sulfate 325 (65 FE) MG tablet  Take 1 tablet (325 mg total) by mouth 3 (three) times daily after meals.     hydrochlorothiazide 12.5  MG capsule  Commonly known as:  MICROZIDE  Take 12.5 mg by mouth every morning.     HYDROcodone-acetaminophen 7.5-325 MG per tablet  Commonly known as:  NORCO  Take 1-2 tablets by mouth every 4 (four) hours as needed for moderate pain.     levothyroxine 88 MCG tablet  Commonly known as:  SYNTHROID, LEVOTHROID  Take 88 mcg by mouth daily before breakfast.     lisinopril 10 MG tablet  Commonly known as:  PRINIVIL,ZESTRIL  Take 10 mg by mouth every morning.     metFORMIN 500 MG tablet  Commonly known as:  GLUCOPHAGE  Take 500 mg by mouth 2 (two) times daily with a meal.     multivitamin with minerals Tabs tablet  Take 2 tablets by mouth every morning. gummy     Omega-3 Fish Oil 1200 MG Caps  Take 1 capsule by mouth every morning.     pantoprazole 40 MG tablet  Commonly known as:  PROTONIX  Take 1 tablet (40 mg total) by mouth 2 (two) times daily.     polyethylene glycol packet  Commonly known as:  MIRALAX / GLYCOLAX  Take 17 g by mouth 2 (two) times daily.     tiZANidine 4 MG capsule  Commonly known as:  ZANAFLEX  Take 1 capsule (4 mg total) by mouth 3 (three) times daily as needed for muscle spasms.     VISINE FOR CONTACTS Soln  Place 4 drops into both eyes 3 (three) times daily as needed (dry eyes).     Vitamin B-12 2500 MCG Subl  Place 2,500 mcg under the tongue daily.         Signed: West Pugh. Randall Colden   PA-C  11/02/2013, 8:43 AM

## 2013-11-02 NOTE — Clinical Social Work Note (Signed)
Patient for d/c today to SNF bed at [facility]. Patient agreeable to this plan- eager to continue her therapies- plan transfer via EMS. Eduard Clos, MSW, [place]

## 2013-11-02 NOTE — Clinical Social Work Placement (Signed)
Clinical Social Work Department CLINICAL SOCIAL WORK PLACEMENT NOTE 11/02/2013  Patient:  Sharon Lowe, Sharon Lowe  Account Number:  [id number] Admit date:  10/30/2013  Clinical Social Worker:  Werner Lean, LCSW  Date/time:  10/31/2013 09:07 AM  Clinical Social Work is seeking post-discharge placement for this patient at the following level of care:   SKILLED NURSING   (*CSW will update this form in Epic as items are completed)     Patient/family provided with [facility] Department of Clinical Social Work's list of facilities offering this level of care within the geographic area requested by the patient (or if unable, by the patient's family).  10/31/2013  Patient/family informed of their freedom to choose among providers that offer the needed level of care, that participate in Medicare, Medicaid or managed care program needed by the patient, have an available bed and are willing to accept the patient.    Patient/family informed of [facility]' ownership interest in [facility], as well as of the fact that they are under no obligation to receive care at this facility.  PASARR submitted to EDS on 10/30/2013 PASARR number received from [employer] on 10/30/2013  FL2 transmitted to all facilities in geographic area requested by pt/family on  10/31/2013 FL2 transmitted to all facilities within larger geographic area on   Patient informed that his/her managed care company has contracts with or will negotiate with  certain facilities, including the following:     Patient/family informed of bed offers received:  11/01/2013 Patient chooses bed at [facility] [place] Physician recommends and patient chooses bed at    Patient to be transferred to [facility] on  11/02/2013 Patient to be transferred to facility by ems  The following physician request were entered in Epic:   Additional Comments: Eduard Clos, MSW, [place]

## 2013-11-02 NOTE — Progress Notes (Signed)
Physical Therapy Treatment Patient Details Name: Sharon Lowe MRN: [id number] DOB: 07-30-1945 Today's Date: 11/02/2013    History of Present Illness Pt is a 68 year old female s/p L TKA revision.    PT Comments    Pt progressing very well and to d/c to SNF today.  Follow Up Recommendations  SNF     Equipment Recommendations  None recommended by PT    Recommendations for Other Services       Precautions / Restrictions Precautions Precautions: Knee Restrictions Other Position/Activity Restrictions: WBAT    Mobility  Bed Mobility Overal bed mobility: Needs Assistance Bed Mobility: Supine to Sit     Supine to sit: Supervision        Transfers Overall transfer level: Needs assistance Equipment used: Rolling walker (2 wheeled) Transfers: Sit to/from Stand Sit to Stand: Supervision         General transfer comment: performed with good technique  Ambulation/Gait Ambulation/Gait assistance: Supervision Ambulation Distance (Feet): 300 Feet Assistive device: Rolling walker (2 wheeled) Gait Pattern/deviations: Step-through pattern;Antalgic     General Gait Details: verbal cues for heel strike   Stairs            Wheelchair Mobility    Modified Rankin (Stroke Patients Only)       Balance                                    Cognition                            Exercises Total Joint Exercises Ankle Circles/Pumps: AROM;Both;15 reps Quad Sets: AROM;Both;15 reps Towel Squeeze: AROM;Both;15 reps Short Arc Quad: Left;15 reps;AAROM Heel Slides: AAROM;Left;15 reps;Seated;Supine Hip ABduction/ADduction: AROM;Left;15 reps Straight Leg Raises: AAROM;Left;10 reps Goniometric ROM: knee flexion 95*    General Comments        Pertinent Vitals/Pain Activity to tolerance, premedicated, ice packs applied    Home Living                      Prior Function            PT Goals (current goals can now be found in  the care plan section) Progress towards PT goals: Progressing toward goals    Frequency  7X/week    PT Plan Current plan remains appropriate    Co-evaluation             End of Session   Activity Tolerance: Patient tolerated treatment well Patient left: in chair;with call bell/phone within reach     Time: 1133-1158 PT Time Calculation (min): 25 min  Charges:  $Gait Training: 8-22 mins $Therapeutic Exercise: 8-22 mins                    G Codes:      Junius Argyle 11/02/2013, 12:57 PM Carmelia Bake, PT, DPT 11/02/2013 Pager: [phone]

## 2013-11-11 DIAGNOSIS — N390 Urinary tract infection, site not specified: Secondary | ICD-10-CM | POA: Diagnosis not present

## 2013-11-11 DIAGNOSIS — IMO0001 Reserved for inherently not codable concepts without codable children: Secondary | ICD-10-CM | POA: Diagnosis not present

## 2013-11-11 DIAGNOSIS — I6990 Unspecified sequelae of unspecified cerebrovascular disease: Secondary | ICD-10-CM | POA: Diagnosis not present

## 2013-11-11 DIAGNOSIS — I10 Essential (primary) hypertension: Secondary | ICD-10-CM | POA: Diagnosis not present

## 2013-11-15 DIAGNOSIS — Z471 Aftercare following joint replacement surgery: Secondary | ICD-10-CM | POA: Diagnosis not present

## 2013-11-15 DIAGNOSIS — I10 Essential (primary) hypertension: Secondary | ICD-10-CM | POA: Diagnosis not present

## 2013-11-15 DIAGNOSIS — D62 Acute posthemorrhagic anemia: Secondary | ICD-10-CM | POA: Diagnosis not present

## 2013-11-15 DIAGNOSIS — IMO0001 Reserved for inherently not codable concepts without codable children: Secondary | ICD-10-CM | POA: Diagnosis not present

## 2013-11-15 DIAGNOSIS — E119 Type 2 diabetes mellitus without complications: Secondary | ICD-10-CM | POA: Diagnosis not present

## 2013-11-15 DIAGNOSIS — Z96659 Presence of unspecified artificial knee joint: Secondary | ICD-10-CM | POA: Diagnosis not present

## 2013-11-17 DIAGNOSIS — Z471 Aftercare following joint replacement surgery: Secondary | ICD-10-CM | POA: Diagnosis not present

## 2013-11-17 DIAGNOSIS — E119 Type 2 diabetes mellitus without complications: Secondary | ICD-10-CM | POA: Diagnosis not present

## 2013-11-17 DIAGNOSIS — IMO0001 Reserved for inherently not codable concepts without codable children: Secondary | ICD-10-CM | POA: Diagnosis not present

## 2013-11-17 DIAGNOSIS — D62 Acute posthemorrhagic anemia: Secondary | ICD-10-CM | POA: Diagnosis not present

## 2013-11-17 DIAGNOSIS — Z96659 Presence of unspecified artificial knee joint: Secondary | ICD-10-CM | POA: Diagnosis not present

## 2013-11-17 DIAGNOSIS — I10 Essential (primary) hypertension: Secondary | ICD-10-CM | POA: Diagnosis not present

## 2013-11-20 DIAGNOSIS — E119 Type 2 diabetes mellitus without complications: Secondary | ICD-10-CM | POA: Diagnosis not present

## 2013-11-20 DIAGNOSIS — Z471 Aftercare following joint replacement surgery: Secondary | ICD-10-CM | POA: Diagnosis not present

## 2013-11-20 DIAGNOSIS — IMO0001 Reserved for inherently not codable concepts without codable children: Secondary | ICD-10-CM | POA: Diagnosis not present

## 2013-11-20 DIAGNOSIS — Z96659 Presence of unspecified artificial knee joint: Secondary | ICD-10-CM | POA: Diagnosis not present

## 2013-11-20 DIAGNOSIS — D62 Acute posthemorrhagic anemia: Secondary | ICD-10-CM | POA: Diagnosis not present

## 2013-11-20 DIAGNOSIS — I10 Essential (primary) hypertension: Secondary | ICD-10-CM | POA: Diagnosis not present

## 2013-11-22 DIAGNOSIS — I10 Essential (primary) hypertension: Secondary | ICD-10-CM | POA: Diagnosis not present

## 2013-11-22 DIAGNOSIS — K219 Gastro-esophageal reflux disease without esophagitis: Secondary | ICD-10-CM | POA: Diagnosis not present

## 2013-11-22 DIAGNOSIS — Z23 Encounter for immunization: Secondary | ICD-10-CM | POA: Diagnosis not present

## 2013-11-22 DIAGNOSIS — I129 Hypertensive chronic kidney disease with stage 1 through stage 4 chronic kidney disease, or unspecified chronic kidney disease: Secondary | ICD-10-CM | POA: Diagnosis not present

## 2013-11-22 DIAGNOSIS — Z471 Aftercare following joint replacement surgery: Secondary | ICD-10-CM | POA: Diagnosis not present

## 2013-11-22 DIAGNOSIS — E119 Type 2 diabetes mellitus without complications: Secondary | ICD-10-CM | POA: Diagnosis not present

## 2013-11-22 DIAGNOSIS — D62 Acute posthemorrhagic anemia: Secondary | ICD-10-CM | POA: Diagnosis not present

## 2013-11-22 DIAGNOSIS — N390 Urinary tract infection, site not specified: Secondary | ICD-10-CM | POA: Diagnosis not present

## 2013-11-22 DIAGNOSIS — N183 Chronic kidney disease, stage 3 unspecified: Secondary | ICD-10-CM | POA: Diagnosis not present

## 2013-11-22 DIAGNOSIS — IMO0001 Reserved for inherently not codable concepts without codable children: Secondary | ICD-10-CM | POA: Diagnosis not present

## 2013-11-22 DIAGNOSIS — Z96659 Presence of unspecified artificial knee joint: Secondary | ICD-10-CM | POA: Diagnosis not present

## 2013-11-24 DIAGNOSIS — IMO0001 Reserved for inherently not codable concepts without codable children: Secondary | ICD-10-CM | POA: Diagnosis not present

## 2013-11-24 DIAGNOSIS — Z471 Aftercare following joint replacement surgery: Secondary | ICD-10-CM | POA: Diagnosis not present

## 2013-11-24 DIAGNOSIS — E119 Type 2 diabetes mellitus without complications: Secondary | ICD-10-CM | POA: Diagnosis not present

## 2013-11-24 DIAGNOSIS — Z96659 Presence of unspecified artificial knee joint: Secondary | ICD-10-CM | POA: Diagnosis not present

## 2013-11-24 DIAGNOSIS — I10 Essential (primary) hypertension: Secondary | ICD-10-CM | POA: Diagnosis not present

## 2013-11-24 DIAGNOSIS — D62 Acute posthemorrhagic anemia: Secondary | ICD-10-CM | POA: Diagnosis not present

## 2013-11-27 DIAGNOSIS — E119 Type 2 diabetes mellitus without complications: Secondary | ICD-10-CM | POA: Diagnosis not present

## 2013-11-27 DIAGNOSIS — I10 Essential (primary) hypertension: Secondary | ICD-10-CM | POA: Diagnosis not present

## 2013-11-27 DIAGNOSIS — D62 Acute posthemorrhagic anemia: Secondary | ICD-10-CM | POA: Diagnosis not present

## 2013-11-27 DIAGNOSIS — Z471 Aftercare following joint replacement surgery: Secondary | ICD-10-CM | POA: Diagnosis not present

## 2013-11-27 DIAGNOSIS — IMO0001 Reserved for inherently not codable concepts without codable children: Secondary | ICD-10-CM | POA: Diagnosis not present

## 2013-11-27 DIAGNOSIS — Z96659 Presence of unspecified artificial knee joint: Secondary | ICD-10-CM | POA: Diagnosis not present

## 2013-11-29 DIAGNOSIS — T84099A Other mechanical complication of unspecified internal joint prosthesis, initial encounter: Secondary | ICD-10-CM | POA: Diagnosis not present

## 2013-12-05 ENCOUNTER — Encounter (HOSPITAL_COMMUNITY): Payer: Medicare Other | Admitting: Anesthesiology

## 2013-12-05 ENCOUNTER — Inpatient Hospital Stay (HOSPITAL_COMMUNITY): Payer: Medicare Other | Admitting: Anesthesiology

## 2013-12-05 ENCOUNTER — Encounter (HOSPITAL_COMMUNITY)
Admission: EM | Disposition: A | Discharge status: Home / Self Care | Payer: Self-pay | Source: Home / Self Care | Attending: Internal Medicine

## 2013-12-05 ENCOUNTER — Inpatient Hospital Stay (HOSPITAL_COMMUNITY): Payer: Medicare Other

## 2013-12-05 ENCOUNTER — Encounter (HOSPITAL_COMMUNITY): Payer: Self-pay | Admitting: Emergency Medicine

## 2013-12-05 ENCOUNTER — Emergency Department (HOSPITAL_COMMUNITY): Payer: Medicare Other

## 2013-12-05 ENCOUNTER — Inpatient Hospital Stay (HOSPITAL_COMMUNITY)
Admission: EM | Admit: 2013-12-05 | Discharge: 2013-12-06 | DRG: 872 | Disposition: A | Discharge status: Home / Self Care | Payer: Medicare Other | Attending: Internal Medicine | Admitting: Internal Medicine

## 2013-12-05 DIAGNOSIS — N12 Tubulo-interstitial nephritis, not specified as acute or chronic: Secondary | ICD-10-CM

## 2013-12-05 DIAGNOSIS — G4730 Sleep apnea, unspecified: Secondary | ICD-10-CM | POA: Diagnosis present

## 2013-12-05 DIAGNOSIS — Z87442 Personal history of urinary calculi: Secondary | ICD-10-CM | POA: Diagnosis not present

## 2013-12-05 DIAGNOSIS — N139 Obstructive and reflux uropathy, unspecified: Secondary | ICD-10-CM | POA: Diagnosis present

## 2013-12-05 DIAGNOSIS — R10819 Abdominal tenderness, unspecified site: Secondary | ICD-10-CM | POA: Diagnosis not present

## 2013-12-05 DIAGNOSIS — Z8673 Personal history of transient ischemic attack (TIA), and cerebral infarction without residual deficits: Secondary | ICD-10-CM | POA: Diagnosis not present

## 2013-12-05 DIAGNOSIS — E785 Hyperlipidemia, unspecified: Secondary | ICD-10-CM | POA: Diagnosis present

## 2013-12-05 DIAGNOSIS — Z9181 History of falling: Secondary | ICD-10-CM | POA: Diagnosis not present

## 2013-12-05 DIAGNOSIS — Z9884 Bariatric surgery status: Secondary | ICD-10-CM | POA: Diagnosis not present

## 2013-12-05 DIAGNOSIS — E663 Overweight: Secondary | ICD-10-CM | POA: Diagnosis present

## 2013-12-05 DIAGNOSIS — N179 Acute kidney failure, unspecified: Secondary | ICD-10-CM | POA: Diagnosis present

## 2013-12-05 DIAGNOSIS — N390 Urinary tract infection, site not specified: Secondary | ICD-10-CM | POA: Diagnosis present

## 2013-12-05 DIAGNOSIS — N1330 Unspecified hydronephrosis: Secondary | ICD-10-CM | POA: Diagnosis present

## 2013-12-05 DIAGNOSIS — N10 Acute tubulo-interstitial nephritis: Secondary | ICD-10-CM | POA: Diagnosis present

## 2013-12-05 DIAGNOSIS — E119 Type 2 diabetes mellitus without complications: Secondary | ICD-10-CM | POA: Diagnosis present

## 2013-12-05 DIAGNOSIS — Z720 Tobacco use: Secondary | ICD-10-CM | POA: Diagnosis present

## 2013-12-05 DIAGNOSIS — K219 Gastro-esophageal reflux disease without esophagitis: Secondary | ICD-10-CM | POA: Diagnosis not present

## 2013-12-05 DIAGNOSIS — E871 Hypo-osmolality and hyponatremia: Secondary | ICD-10-CM

## 2013-12-05 DIAGNOSIS — N201 Calculus of ureter: Secondary | ICD-10-CM | POA: Diagnosis present

## 2013-12-05 DIAGNOSIS — J984 Other disorders of lung: Secondary | ICD-10-CM | POA: Diagnosis not present

## 2013-12-05 DIAGNOSIS — R109 Unspecified abdominal pain: Secondary | ICD-10-CM | POA: Diagnosis not present

## 2013-12-05 DIAGNOSIS — Z6825 Body mass index (BMI) 25.0-25.9, adult: Secondary | ICD-10-CM | POA: Diagnosis not present

## 2013-12-05 DIAGNOSIS — A419 Sepsis, unspecified organism: Principal | ICD-10-CM | POA: Diagnosis present

## 2013-12-05 DIAGNOSIS — E089 Diabetes mellitus due to underlying condition without complications: Secondary | ICD-10-CM

## 2013-12-05 DIAGNOSIS — E039 Hypothyroidism, unspecified: Secondary | ICD-10-CM | POA: Diagnosis present

## 2013-12-05 DIAGNOSIS — F17200 Nicotine dependence, unspecified, uncomplicated: Secondary | ICD-10-CM | POA: Diagnosis present

## 2013-12-05 DIAGNOSIS — N200 Calculus of kidney: Secondary | ICD-10-CM | POA: Diagnosis present

## 2013-12-05 DIAGNOSIS — I10 Essential (primary) hypertension: Secondary | ICD-10-CM | POA: Diagnosis present

## 2013-12-05 DIAGNOSIS — R112 Nausea with vomiting, unspecified: Secondary | ICD-10-CM | POA: Diagnosis not present

## 2013-12-05 DIAGNOSIS — K2970 Gastritis, unspecified, without bleeding: Secondary | ICD-10-CM | POA: Diagnosis not present

## 2013-12-05 DIAGNOSIS — N211 Calculus in urethra: Secondary | ICD-10-CM | POA: Diagnosis not present

## 2013-12-05 DIAGNOSIS — M1990 Unspecified osteoarthritis, unspecified site: Secondary | ICD-10-CM | POA: Diagnosis not present

## 2013-12-05 DIAGNOSIS — K8020 Calculus of gallbladder without cholecystitis without obstruction: Secondary | ICD-10-CM | POA: Diagnosis not present

## 2013-12-05 DIAGNOSIS — D500 Iron deficiency anemia secondary to blood loss (chronic): Secondary | ICD-10-CM

## 2013-12-05 DIAGNOSIS — D72829 Elevated white blood cell count, unspecified: Secondary | ICD-10-CM | POA: Diagnosis present

## 2013-12-05 DIAGNOSIS — K2990 Gastroduodenitis, unspecified, without bleeding: Secondary | ICD-10-CM | POA: Diagnosis not present

## 2013-12-05 HISTORY — PX: CYSTOSCOPY W/ URETERAL STENT PLACEMENT: SHX1429

## 2013-12-05 LAB — URINALYSIS, ROUTINE W REFLEX MICROSCOPIC
BILIRUBIN URINE: NEGATIVE
Glucose, UA: NEGATIVE mg/dL
KETONES UR: NEGATIVE mg/dL
Nitrite: POSITIVE — AB
PH: 5.5 (ref 5.0–8.0)
Protein, ur: 30 mg/dL — AB
Specific Gravity, Urine: 1.014 (ref 1.005–1.030)
Urobilinogen, UA: 0.2 mg/dL (ref 0.0–1.0)

## 2013-12-05 LAB — URINE MICROSCOPIC-ADD ON

## 2013-12-05 LAB — GLUCOSE, CAPILLARY
GLUCOSE-CAPILLARY: 233 mg/dL — AB (ref 70–99)
GLUCOSE-CAPILLARY: 144 mg/dL — AB (ref 70–99)
Glucose-Capillary: 194 mg/dL — ABNORMAL HIGH (ref 70–99)

## 2013-12-05 LAB — CBC
HCT: 37.1 % (ref 36.0–46.0)
Hemoglobin: 12.4 g/dL (ref 12.0–15.0)
MCH: 32.4 pg (ref 26.0–34.0)
MCHC: 33.4 g/dL (ref 30.0–36.0)
MCV: 96.9 fL (ref 78.0–100.0)
Platelets: 345 10*3/uL (ref 150–400)
RBC: 3.83 MIL/uL — ABNORMAL LOW (ref 3.87–5.11)
RDW: 15.4 % (ref 11.5–15.5)
WBC: 19.4 10*3/uL — ABNORMAL HIGH (ref 4.0–10.5)

## 2013-12-05 LAB — COMPREHENSIVE METABOLIC PANEL
ALK PHOS: 57 U/L (ref 39–117)
ALT: 29 U/L (ref 0–35)
AST: 53 U/L — AB (ref 0–37)
Albumin: 3 g/dL — ABNORMAL LOW (ref 3.5–5.2)
Anion gap: 16 — ABNORMAL HIGH (ref 5–15)
BILIRUBIN TOTAL: 0.4 mg/dL (ref 0.3–1.2)
BUN: 28 mg/dL — ABNORMAL HIGH (ref 6–23)
CO2: 20 meq/L (ref 19–32)
CREATININE: 1.18 mg/dL — AB (ref 0.50–1.10)
Calcium: 9.5 mg/dL (ref 8.4–10.5)
Chloride: 101 mEq/L (ref 96–112)
GFR calc Af Amer: 54 mL/min — ABNORMAL LOW (ref >90)
GFR, EST NON AFRICAN AMERICAN: 46 mL/min — AB (ref >90)
Glucose, Bld: 234 mg/dL — ABNORMAL HIGH (ref 70–99)
Potassium: 5.2 mEq/L (ref 3.7–5.3)
Sodium: 137 mEq/L (ref 137–147)
Total Protein: 6.9 g/dL (ref 6.0–8.3)

## 2013-12-05 LAB — SURGICAL PCR SCREEN
MRSA, PCR: NEGATIVE
Staphylococcus aureus: NEGATIVE

## 2013-12-05 LAB — MAGNESIUM: Magnesium: 1.6 mg/dL (ref 1.5–2.5)

## 2013-12-05 LAB — PROCALCITONIN: Procalcitonin: 0.96 ng/mL

## 2013-12-05 LAB — TSH: TSH: 1.72 u[IU]/mL (ref 0.350–4.500)

## 2013-12-05 LAB — LIPASE, BLOOD: LIPASE: 55 U/L (ref 11–59)

## 2013-12-05 LAB — LACTIC ACID, PLASMA: Lactic Acid, Venous: 2.6 mmol/L — ABNORMAL HIGH (ref 0.5–2.2)

## 2013-12-05 SURGERY — CYSTOSCOPY, WITH RETROGRADE PYELOGRAM AND URETERAL STENT INSERTION
Anesthesia: General | Site: Pelvis | Laterality: Bilateral

## 2013-12-05 MED ORDER — VITAMIN B-12 1000 MCG PO TABS
1000.0000 ug | ORAL_TABLET | Freq: Every day | ORAL | Status: DC
Start: 2013-12-05 — End: 2013-12-06
  Administered 2013-12-05 – 2013-12-06 (×2): 1000 ug via ORAL
  Filled 2013-12-05 (×2): qty 1

## 2013-12-05 MED ORDER — METOCLOPRAMIDE HCL 5 MG/ML IJ SOLN
10.0000 mg | Freq: Once | INTRAMUSCULAR | Status: AC
  Administered 2013-12-05: 10 mg via INTRAVENOUS
  Filled 2013-12-05: qty 2

## 2013-12-05 MED ORDER — TIZANIDINE HCL 4 MG PO TABS
4.0000 mg | ORAL_TABLET | Freq: Three times a day (TID) | ORAL | Status: DC | PRN
  Administered 2013-12-05: 4 mg via ORAL
  Filled 2013-12-05: qty 1

## 2013-12-05 MED ORDER — FENTANYL CITRATE 0.05 MG/ML IJ SOLN
50.0000 ug | Freq: Once | INTRAMUSCULAR | Status: AC
  Administered 2013-12-05: 50 ug via INTRAVENOUS
  Filled 2013-12-05: qty 2

## 2013-12-05 MED ORDER — LIDOCAINE HCL 2 % EX GEL
CUTANEOUS | Status: DC | PRN
  Administered 2013-12-05: 1 via URETHRAL

## 2013-12-05 MED ORDER — ONDANSETRON HCL 4 MG/2ML IJ SOLN: 4.0000 mg | Freq: Four times a day (QID) | INTRAMUSCULAR | Status: DC | PRN

## 2013-12-05 MED ORDER — SODIUM CHLORIDE 0.9 % IR SOLN
Status: DC | PRN
  Administered 2013-12-05: 1000 mL via INTRAVESICAL

## 2013-12-05 MED ORDER — ONDANSETRON HCL 4 MG/2ML IJ SOLN
INTRAMUSCULAR | Status: DC | PRN
  Administered 2013-12-05: 4 mg via INTRAVENOUS

## 2013-12-05 MED ORDER — SORBITOL 70 % SOLN
30.0000 mL | Freq: Every day | Status: DC | PRN
  Filled 2013-12-05: qty 30

## 2013-12-05 MED ORDER — MUPIROCIN 2 % EX OINT
TOPICAL_OINTMENT | CUTANEOUS | Status: AC
  Administered 2013-12-05: 11:00:00
  Filled 2013-12-05: qty 22

## 2013-12-05 MED ORDER — FENTANYL CITRATE 0.05 MG/ML IJ SOLN
INTRAMUSCULAR | Status: DC | PRN
  Administered 2013-12-05 (×2): 50 ug via INTRAVENOUS

## 2013-12-05 MED ORDER — SODIUM CHLORIDE 3 % IN NEBU
INHALATION_SOLUTION | RESPIRATORY_TRACT | Status: AC
  Filled 2013-12-05: qty 15

## 2013-12-05 MED ORDER — ASPIRIN 325 MG PO TABS
325.0000 mg | ORAL_TABLET | Freq: Two times a day (BID) | ORAL | Status: DC
  Administered 2013-12-05 – 2013-12-06 (×2): 325 mg via ORAL
  Filled 2013-12-05 (×4): qty 1

## 2013-12-05 MED ORDER — LIDOCAINE HCL (CARDIAC) 20 MG/ML IV SOLN
INTRAVENOUS | Status: DC | PRN
  Administered 2013-12-05: 50 mg via INTRAVENOUS

## 2013-12-05 MED ORDER — LIDOCAINE HCL (CARDIAC) 20 MG/ML IV SOLN
INTRAVENOUS | Status: AC
  Filled 2013-12-05: qty 5

## 2013-12-05 MED ORDER — PROMETHAZINE HCL 25 MG/ML IJ SOLN: 6.2500 mg | INTRAMUSCULAR | Status: DC | PRN

## 2013-12-05 MED ORDER — MIDAZOLAM HCL 5 MG/5ML IJ SOLN
INTRAMUSCULAR | Status: DC | PRN
  Administered 2013-12-05: 2 mg via INTRAVENOUS

## 2013-12-05 MED ORDER — OXYCODONE HCL 5 MG/5ML PO SOLN
5.0000 mg | Freq: Once | ORAL | Status: DC | PRN
  Filled 2013-12-05: qty 5

## 2013-12-05 MED ORDER — ATORVASTATIN CALCIUM 10 MG PO TABS
10.0000 mg | ORAL_TABLET | Freq: Every evening | ORAL | Status: DC
  Administered 2013-12-05: 10 mg via ORAL
  Filled 2013-12-05 (×2): qty 1

## 2013-12-05 MED ORDER — PIPERACILLIN-TAZOBACTAM 3.375 G IVPB
3.3750 g | Freq: Three times a day (TID) | INTRAVENOUS | Status: DC
  Administered 2013-12-05 – 2013-12-06 (×3): 3.375 g via INTRAVENOUS
  Filled 2013-12-05 (×4): qty 50

## 2013-12-05 MED ORDER — MORPHINE SULFATE 4 MG/ML IJ SOLN
4.0000 mg | Freq: Once | INTRAMUSCULAR | Status: AC
  Administered 2013-12-05: 4 mg via INTRAVENOUS
  Filled 2013-12-05: qty 1

## 2013-12-05 MED ORDER — SODIUM CHLORIDE 0.9 % IV BOLUS (SEPSIS)
1000.0000 mL | Freq: Once | INTRAVENOUS | Status: AC
  Administered 2013-12-05: 1000 mL via INTRAVENOUS

## 2013-12-05 MED ORDER — VISINE FOR CONTACTS SOLN: 2.0000 [drp] | Freq: Three times a day (TID) | Status: DC | PRN

## 2013-12-05 MED ORDER — METOPROLOL TARTRATE 1 MG/ML IV SOLN
INTRAVENOUS | Status: AC
  Filled 2013-12-05: qty 5

## 2013-12-05 MED ORDER — FENTANYL CITRATE 0.05 MG/ML IJ SOLN
INTRAMUSCULAR | Status: AC
  Filled 2013-12-05: qty 2

## 2013-12-05 MED ORDER — DEXTROSE 5 % IV SOLN
1.0000 g | Freq: Once | INTRAVENOUS | Status: AC
  Administered 2013-12-05: 1 g via INTRAVENOUS
  Filled 2013-12-05: qty 10

## 2013-12-05 MED ORDER — ACETAMINOPHEN 650 MG RE SUPP: 650.0000 mg | Freq: Four times a day (QID) | RECTAL | Status: DC | PRN

## 2013-12-05 MED ORDER — NICOTINE 21 MG/24HR TD PT24
21.0000 mg | MEDICATED_PATCH | Freq: Every day | TRANSDERMAL | Status: DC
  Filled 2013-12-05 (×2): qty 1

## 2013-12-05 MED ORDER — OMEGA-3-ACID ETHYL ESTERS 1 G PO CAPS
1.0000 g | ORAL_CAPSULE | Freq: Two times a day (BID) | ORAL | Status: DC
  Administered 2013-12-05 – 2013-12-06 (×2): 1 g via ORAL
  Filled 2013-12-05 (×4): qty 1

## 2013-12-05 MED ORDER — IPRATROPIUM BROMIDE 0.02 % IN SOLN: 0.5000 mg | RESPIRATORY_TRACT | Status: DC | PRN

## 2013-12-05 MED ORDER — IOHEXOL 300 MG/ML  SOLN
INTRAMUSCULAR | Status: DC | PRN
  Administered 2013-12-05: 8 mL

## 2013-12-05 MED ORDER — HYDROCODONE-ACETAMINOPHEN 7.5-325 MG PO TABS: 1.0000 | ORAL_TABLET | ORAL | Status: DC | PRN

## 2013-12-05 MED ORDER — HYDRALAZINE HCL 20 MG/ML IJ SOLN
10.0000 mg | Freq: Four times a day (QID) | INTRAMUSCULAR | Status: DC | PRN
  Filled 2013-12-05: qty 0.5

## 2013-12-05 MED ORDER — ONDANSETRON HCL 4 MG PO TABS
4.0000 mg | ORAL_TABLET | Freq: Four times a day (QID) | ORAL | Status: DC | PRN
Start: 2013-12-05 — End: 2013-12-06

## 2013-12-05 MED ORDER — ONDANSETRON HCL 4 MG/2ML IJ SOLN
INTRAMUSCULAR | Status: AC
  Filled 2013-12-05: qty 2

## 2013-12-05 MED ORDER — ENOXAPARIN SODIUM 40 MG/0.4ML ~~LOC~~ SOLN
40.0000 mg | Freq: Every day | SUBCUTANEOUS | Status: DC
  Administered 2013-12-06: 40 mg via SUBCUTANEOUS
  Filled 2013-12-05 (×2): qty 0.4

## 2013-12-05 MED ORDER — PANTOPRAZOLE SODIUM 40 MG PO TBEC
40.0000 mg | DELAYED_RELEASE_TABLET | Freq: Two times a day (BID) | ORAL | Status: DC
  Administered 2013-12-05 – 2013-12-06 (×3): 40 mg via ORAL
  Filled 2013-12-05 (×4): qty 1

## 2013-12-05 MED ORDER — ACETAMINOPHEN 325 MG PO TABS
650.0000 mg | ORAL_TABLET | ORAL | Status: DC | PRN
  Administered 2013-12-05: 650 mg via ORAL
  Filled 2013-12-05: qty 2

## 2013-12-05 MED ORDER — SODIUM CHLORIDE 0.9 % IV SOLN
INTRAVENOUS | Status: DC
  Filled 2013-12-05: qty 1000

## 2013-12-05 MED ORDER — METOPROLOL TARTRATE 1 MG/ML IV SOLN
5.0000 mg | Freq: Three times a day (TID) | INTRAVENOUS | Status: DC
  Administered 2013-12-05 – 2013-12-06 (×2): 5 mg via INTRAVENOUS
  Filled 2013-12-05 (×5): qty 5

## 2013-12-05 MED ORDER — ADULT MULTIVITAMIN W/MINERALS CH
2.0000 | ORAL_TABLET | Freq: Every morning | ORAL | Status: DC
  Administered 2013-12-06: 2 via ORAL
  Filled 2013-12-05 (×2): qty 2

## 2013-12-05 MED ORDER — BUPROPION HCL ER (SR) 100 MG PO TB12
100.0000 mg | ORAL_TABLET | Freq: Every morning | ORAL | Status: DC
  Administered 2013-12-06: 100 mg via ORAL
  Filled 2013-12-05 (×2): qty 1

## 2013-12-05 MED ORDER — LACTATED RINGERS IV SOLN: INTRAVENOUS | Status: DC

## 2013-12-05 MED ORDER — LORATADINE 10 MG PO TABS
10.0000 mg | ORAL_TABLET | Freq: Every morning | ORAL | Status: DC
  Administered 2013-12-06: 10 mg via ORAL
  Filled 2013-12-05 (×2): qty 1

## 2013-12-05 MED ORDER — MIDAZOLAM HCL 2 MG/2ML IJ SOLN
INTRAMUSCULAR | Status: AC
  Filled 2013-12-05: qty 2

## 2013-12-05 MED ORDER — BELLADONNA ALKALOIDS-OPIUM 16.2-60 MG RE SUPP
RECTAL | Status: AC
  Filled 2013-12-05: qty 1

## 2013-12-05 MED ORDER — SENNOSIDES-DOCUSATE SODIUM 8.6-50 MG PO TABS: 1.0000 | ORAL_TABLET | Freq: Every evening | ORAL | Status: DC | PRN

## 2013-12-05 MED ORDER — HYDROMORPHONE HCL PF 1 MG/ML IJ SOLN: 0.2500 mg | INTRAMUSCULAR | Status: DC | PRN

## 2013-12-05 MED ORDER — DOCUSATE SODIUM 100 MG PO CAPS
100.0000 mg | ORAL_CAPSULE | Freq: Two times a day (BID) | ORAL | Status: DC
  Administered 2013-12-05 – 2013-12-06 (×2): 100 mg via ORAL
  Filled 2013-12-05 (×4): qty 1

## 2013-12-05 MED ORDER — BELLADONNA ALKALOIDS-OPIUM 16.2-60 MG RE SUPP
RECTAL | Status: DC | PRN
  Administered 2013-12-05: 1 via RECTAL

## 2013-12-05 MED ORDER — GENTAMICIN SULFATE 40 MG/ML IJ SOLN
2.5000 mg/kg | Freq: Once | INTRAVENOUS | Status: AC
  Administered 2013-12-05: 210 mg via INTRAVENOUS
  Filled 2013-12-05: qty 5.25

## 2013-12-05 MED ORDER — SODIUM CHLORIDE 0.9 % IJ SOLN
3.0000 mL | Freq: Two times a day (BID) | INTRAMUSCULAR | Status: DC
  Administered 2013-12-05: 3 mL via INTRAVENOUS

## 2013-12-05 MED ORDER — LACTATED RINGERS IV SOLN
INTRAVENOUS | Status: DC | PRN
  Administered 2013-12-05: 12:00:00 via INTRAVENOUS

## 2013-12-05 MED ORDER — MEPERIDINE HCL 50 MG/ML IJ SOLN: 6.2500 mg | INTRAMUSCULAR | Status: DC | PRN

## 2013-12-05 MED ORDER — PROPOFOL 10 MG/ML IV BOLUS
INTRAVENOUS | Status: DC | PRN
  Administered 2013-12-05: 160 mg via INTRAVENOUS

## 2013-12-05 MED ORDER — ONDANSETRON HCL 4 MG/2ML IJ SOLN
4.0000 mg | Freq: Once | INTRAMUSCULAR | Status: AC
  Administered 2013-12-05: 4 mg via INTRAVENOUS
  Filled 2013-12-05: qty 2

## 2013-12-05 MED ORDER — MORPHINE SULFATE 2 MG/ML IJ SOLN: 2.0000 mg | INTRAMUSCULAR | Status: DC | PRN

## 2013-12-05 MED ORDER — OXYCODONE HCL 5 MG PO TABS: 5.0000 mg | ORAL_TABLET | Freq: Once | ORAL | Status: DC | PRN

## 2013-12-05 MED ORDER — SODIUM CHLORIDE 0.9 % IJ SOLN
INTRAMUSCULAR | Status: AC
  Filled 2013-12-05: qty 3

## 2013-12-05 MED ORDER — OMEGA-3 FISH OIL 1200 MG PO CAPS: 1.0000 | ORAL_CAPSULE | ORAL | Status: DC

## 2013-12-05 MED ORDER — NAPHAZOLINE HCL 0.1 % OP SOLN
2.0000 [drp] | Freq: Three times a day (TID) | OPHTHALMIC | Status: DC | PRN
  Filled 2013-12-05: qty 15

## 2013-12-05 MED ORDER — SUCCINYLCHOLINE CHLORIDE 20 MG/ML IJ SOLN
INTRAMUSCULAR | Status: DC | PRN
  Administered 2013-12-05: 100 mg via INTRAVENOUS

## 2013-12-05 MED ORDER — ACETAMINOPHEN 325 MG PO TABS: 650.0000 mg | ORAL_TABLET | Freq: Four times a day (QID) | ORAL | Status: DC | PRN

## 2013-12-05 MED ORDER — INSULIN ASPART 100 UNIT/ML ~~LOC~~ SOLN
0.0000 [IU] | Freq: Three times a day (TID) | SUBCUTANEOUS | Status: DC
  Administered 2013-12-05: 5 [IU] via SUBCUTANEOUS
  Administered 2013-12-06: 2 [IU] via SUBCUTANEOUS

## 2013-12-05 MED ORDER — SODIUM CHLORIDE 0.9 % IV SOLN
INTRAVENOUS | Status: DC
  Administered 2013-12-05: 15:00:00 via INTRAVENOUS

## 2013-12-05 MED ORDER — ALBUTEROL SULFATE (2.5 MG/3ML) 0.083% IN NEBU: 2.5000 mg | INHALATION_SOLUTION | RESPIRATORY_TRACT | Status: DC | PRN

## 2013-12-05 MED ORDER — LIDOCAINE HCL 2 % EX GEL
CUTANEOUS | Status: AC
  Filled 2013-12-05: qty 10

## 2013-12-05 MED ORDER — LEVOTHYROXINE SODIUM 88 MCG PO TABS
88.0000 ug | ORAL_TABLET | Freq: Every day | ORAL | Status: DC
  Administered 2013-12-06: 88 ug via ORAL
  Filled 2013-12-05 (×2): qty 1

## 2013-12-05 MED ORDER — VITAMIN B-12 2500 MCG SL SUBL: 2500.0000 ug | SUBLINGUAL_TABLET | Freq: Every morning | SUBLINGUAL | Status: DC

## 2013-12-05 MED ORDER — PROPOFOL 10 MG/ML IV BOLUS
INTRAVENOUS | Status: AC
  Filled 2013-12-05: qty 20

## 2013-12-05 SURGICAL SUPPLY — 15 items
BAG URO CATCHER STRL LF (DRAPE) ×3 IMPLANT
CATH URET 5FR 28IN CONE TIP (BALLOONS)
CATH URET 5FR 28IN OPEN ENDED (CATHETERS) ×2 IMPLANT
CATH URET 5FR 70CM CONE TIP (BALLOONS) ×1 IMPLANT
CLOTH BEACON ORANGE TIMEOUT ST (SAFETY) ×3 IMPLANT
DRAPE CAMERA CLOSED 9X96 (DRAPES) ×3 IMPLANT
GLOVE BIOGEL M STRL SZ7.5 (GLOVE) ×3 IMPLANT
GOWN STRL REUS W/TWL XL LVL3 (GOWN DISPOSABLE) ×3 IMPLANT
GUIDEWIRE STR DUAL SENSOR (WIRE) ×3 IMPLANT
MANIFOLD NEPTUNE II (INSTRUMENTS) ×3 IMPLANT
PACK CYSTO (CUSTOM PROCEDURE TRAY) ×3 IMPLANT
STENT CONTOUR 6FRX26X.038 (STENTS) ×4 IMPLANT
TUBING CONNECTING 10 (TUBING) ×2 IMPLANT
TUBING CONNECTING 10' (TUBING) ×1
WIRE COONS/BENSON .038X145CM (WIRE) IMPLANT

## 2013-12-05 NOTE — ED Notes (Addendum)
Pt in by ems from home. C/o R flank pain that woke her from sleep approx 2am today. Hx kidney stones. Radiation to RLQ abd. 212mcg Fentanyl and 4mg  Zofran given en route. EMS IV 22g L hand.

## 2013-12-05 NOTE — Progress Notes (Signed)
Dr. Lissa Hoard made aware of patient's blood pressures pre-op and post-op- made aware of Aldrete score being 0 on blood pressures- O.K. To go to floor.

## 2013-12-05 NOTE — H&P (Signed)
[facility] History and Physical  Sharon Lowe MPN:[id number] DOB: July 09, 1945 DOA: 12/05/2013  Referring physician: Dr Mingo Amber PCP: Mathews Argyle, MD   Chief Complaint: Right back/flank pain  HPI: Sharon Lowe is a 68 y.o. female  With history of diabetes, hypertension, nephrolithiasis, hyperlipidemia, thyroid disease, depression, history of CVA who presents to the ED with a several hour history of right flank pain not relieved by oral pain medications. Patient states she awoke around 1 AM on the morning of admission with right Donald flank pain and lower back pain. Patient fell in May of likely be secondary to a kidney stones and try to go back to bed. Patient stated that unable to sleep and woke up around 3 AM and took a pain pill. Patient stated that that had no significant improvement. Patient then awoke up at 6 AM and was still in pain and subsequently called EMS and was brought to the ED and patient denied any fevers at home no chills, no chest pain, no shortness of breath, no abdominal pain, no diarrhea, no constipation, no melena, no hematemesis, no hematochezia, no suprapubic pain, no dysuria, no hematuria. Patient does endorse some nausea and generalized weakness.  Patient was seen in the emergency room urinalysis which was done was consistent with a UTI. Comprehensive metabolic profile obtained at a BUN of 28 creatinine 1.18 albumin of 3.0 AST of 53 otherwise was within normal limits. CBC obtained on a white count of 19.4 otherwise was within normal limits. Patient was given a dose of IV Rocephin. Urology was consulted and per ED physician was planning to take the patient to the operating room. We were called to admit the patient for further evaluation and management.   Review of Systems: As per history of present illness otherwise negative. Constitutional:  No weight loss, night sweats, Fevers, chills, fatigue.  HEENT:  No headaches, Difficulty swallowing,Tooth/dental  problems,Sore throat,  No sneezing, itching, ear ache, nasal congestion, post nasal drip,  Cardio-vascular:  No chest pain, Orthopnea, PND, swelling in lower extremities, anasarca, dizziness, palpitations  GI:  No heartburn, indigestion, abdominal pain, nausea, vomiting, diarrhea, change in bowel habits, loss of appetite  Resp:  No shortness of breath with exertion or at rest. No excess mucus, no productive cough, No non-productive cough, No coughing up of blood.No change in color of mucus.No wheezing.No chest wall deformity  Skin:  no rash or lesions.  GU:  no dysuria, change in color of urine, no urgency or frequency. No flank pain.  Musculoskeletal:  No joint pain or swelling. No decreased range of motion. No back pain.  Psych:  No change in mood or affect. No depression or anxiety. No memory loss.   Past Medical History  Diagnosis Date  . Diabetes mellitus   . Hypertension   . Hyperlipidemia   . Substance abuse   . Thyroid disease   . GERD (gastroesophageal reflux disease)   . Chest pain 10/21/13   . Stroke     mini strokes- 2000  . Sleep apnea     could not handle CPAP- 2012   . Hypothyroidism   . Depression   . Urinary tract infection 10/20/2013   . Kidney stones   . Arthritis    Past Surgical History  Procedure Laterality Date  . Abdominal hysterectomy  1995  . Replacement unicondylar joint knee  2004    left  . Rotator cuff repair  2004    left  . Gastric bypass  08/18/10  .  Knee arthroscopy      right  . Left knee surgery   2006     repair of broken knee cap   . Massive abdominal infection surgery   1977  . Total knee revision with scar debridement/patella revision with poly exchange Left 10/30/2013    Procedure: LEFT  TOTAL  KNEE  REVISION;  Surgeon: Mauri Pole, MD;  Location: [facility];  Service: Orthopedics;  Laterality: Left;   Social History:  reports that she has been smoking Cigarettes.  She has a 2 pack-year smoking history. She has never used  smokeless tobacco. She reports that she drinks alcohol. She reports that she does not use illicit drugs.  Allergies  Allergen Reactions  . Sulfa Antibiotics Itching and Rash    Family History  Problem Relation Age of Onset  . Diabetes Mother   . Hypertension Mother   . Hyperlipidemia Mother   . Heart disease Father   . Cancer Maternal Uncle     prostate, colon  . Cancer Paternal Grandmother     unaware of what kind     Prior to Admission medications   Medication Sig Start Date End Date Taking? Authorizing Provider  aspirin 325 MG tablet Take 325 mg by mouth 2 (two) times daily.   Yes Historical Provider, MD  atorvastatin (LIPITOR) 10 MG tablet Take 10 mg by mouth every evening.   Yes Historical Provider, MD  buPROPion (WELLBUTRIN SR) 100 MG 12 hr tablet Take 100 mg by mouth every morning.   Yes Historical Provider, MD  Calcium Carb-Cholecalciferol (CALCIUM 600+D) 600-800 MG-UNIT TABS Take 1 tablet by mouth 2 (two) times daily.   Yes Historical Provider, MD  Cyanocobalamin (VITAMIN B-12) 2500 MCG SUBL Place 2,500 mcg under the tongue every morning.    Yes Historical Provider, MD  hydrochlorothiazide (MICROZIDE) 12.5 MG capsule Take 12.5 mg by mouth every morning.   Yes Historical Provider, MD  HYDROcodone-acetaminophen (NORCO) 7.5-325 MG per tablet Take 1-2 tablets by mouth every 4 (four) hours as needed for moderate pain.   Yes Historical Provider, MD  levothyroxine (SYNTHROID, LEVOTHROID) 88 MCG tablet Take 88 mcg by mouth daily before breakfast.  12/31/10  Yes Historical Provider, MD  lisinopril (PRINIVIL,ZESTRIL) 10 MG tablet Take 10 mg by mouth every morning.    Yes Historical Provider, MD  loratadine (ALLERGY) 10 MG tablet Take 10 mg by mouth every morning.    Yes Historical Provider, MD  metFORMIN (GLUCOPHAGE) 500 MG tablet Take 500 mg by mouth 2 (two) times daily with a meal.   Yes Historical Provider, MD  Multiple Vitamin (MULTIVITAMIN WITH MINERALS) TABS tablet Take 2 tablets  by mouth every morning.    Yes Historical Provider, MD  Omega-3 Fatty Acids (OMEGA-3 FISH OIL) 1200 MG CAPS Take 1 capsule by mouth every morning.   Yes Historical Provider, MD  pantoprazole (PROTONIX) 40 MG tablet Take 40 mg by mouth 2 (two) times daily.   Yes Historical Provider, MD  Soft Lens Products (VISINE FOR CONTACTS) SOLN Place 2 drops into both eyes 3 (three) times daily as needed (dry eyes).    Yes Historical Provider, MD  tiZANidine (ZANAFLEX) 4 MG tablet Take 4 mg by mouth 3 (three) times daily as needed for muscle spasms.   Yes Historical Provider, MD   Physical Exam: Filed Vitals:   12/05/13 1041  BP: 192/77  Pulse: 100  Temp: 101 F (38.3 C)  Resp: 18    BP 192/77  Pulse 100  Temp(Src)  101 F (38.3 C) (Oral)  Resp 18  Wt 83.5 kg (184 lb 1.4 oz)  SpO2 97%  General:  Well-developed well-nourished in no acute cardiopulmonary distress.  Eyes: PERRLA, EOMI, normal lids, irises & conjunctiva ENT: grossly normal hearing, lips & tongue. Dry mucous membranes. Neck: no LAD, masses or thyromegaly Cardiovascular: RRR, no m/r/g. No LE edema. Respiratory: CTA bilaterally, no w/r/r. Normal respiratory effort. Abdomen: soft, ntnd, positive bowel sounds, no rebound, no guarding. Patient denies any right flank pain however is received IV narcotic pain medications. Skin: no rash or induration seen on limited exam Musculoskeletal: grossly normal tone BUE/BLE Psychiatric: grossly normal mood and affect, speech fluent and appropriate Neurologic: Alert and oriented x3. Cranial nerves II through XII are grossly intact. No focal deficits.           Labs on Admission:  Basic Metabolic Panel:  Recent Labs Lab 12/05/13 0815 12/05/13 0822  NA  --  137  K  --  5.2  CL  --  101  CO2  --  20  GLUCOSE  --  234*  BUN  --  28*  CREATININE  --  1.18*  CALCIUM  --  9.5  MG 1.6  --    Liver Function Tests:  Recent Labs Lab 12/05/13 0822  AST 53*  ALT 29  ALKPHOS 57  BILITOT  0.4  PROT 6.9  ALBUMIN 3.0*    Recent Labs Lab 12/05/13 3500  LIPASE 55   No results found for this basename: AMMONIA,  in the last 168 hours CBC:  Recent Labs Lab 12/05/13 0822  WBC 19.4*  HGB 12.4  HCT 37.1  MCV 96.9  PLT 345   Cardiac Enzymes: No results found for this basename: CKTOTAL, CKMB, CKMBINDEX, TROPONINI,  in the last 168 hours  BNP (last 3 results) No results found for this basename: PROBNP,  in the last 8760 hours CBG: No results found for this basename: GLUCAP,  in the last 168 hours  Radiological Exams on Admission: Ct Abdomen Pelvis Wo Contrast  12/05/2013   CLINICAL DATA:  Flank pain, history of renal calculi  EXAM: CT ABDOMEN AND PELVIS WITHOUT CONTRAST  TECHNIQUE: Multidetector CT imaging of the abdomen and pelvis was performed following the standard protocol without IV contrast.  COMPARISON:  07/01/2012  FINDINGS: The lung bases are clear.  There is a 6.5 mm distal right ureteral calculus resulting in severe right hydroureteronephrosis and severe right perinephric stranding likely related to fornix rupture. There is an 8 mm distal left ureteral calculus just proximal to the UVJ resulting in severe left hydroureteronephrosis. There is right nephromegaly. There is no exophytic renal mass. The bladder is unremarkable.  The liver demonstrates no focal abnormality. There are cholelithiasis. The spleen demonstrates no focal abnormality. The adrenal glands and pancreas are normal.  There is evidence of prior gastric bypass surgery with postsurgical changes in the epigastric region. The unopacified duodenum, small intestine and large intestine are unremarkable, but evaluation is limited by lack of oral contrast. There is no bowel obstruction. There is no pneumoperitoneum, pneumatosis, or portal venous gas. There is no abdominal or pelvic free fluid. There is no lymphadenopathy.  The abdominal aorta is normal in caliber with atherosclerosis.  There is lumbar spine  spondylosis.  IMPRESSION: 1. There is a 6.5 mm distal right ureteral calculus resulting in severe right hydroureteronephrosis and severe right perinephric stranding likely related to fornix rupture. 2. There is an 8 mm distal left ureteral calculus just proximal  to the UVJ resulting in severe left hydroureteronephrosis. 3. Cholelithiasis.   Electronically Signed   By: Kathreen Devoid   On: 12/05/2013 08:49    EKG: None  Assessment/Plan Principal Problem:   Sepsis Active Problems:   Acute bilateral obstructive uropathy   UTI (lower urinary tract infection)   Acute pyelonephritis: Probable   ARF (acute renal failure)   DM (diabetes mellitus) for 20 years ID   Hypertension   Tobacco abuse   Other and unspecified hyperlipidemia   Leukocytosis, unspecified   Overweight (BMI 25.0-29.9)   Recurrent nephrolithiasis  #1 sepsis Likely secondary to UTI and probable pyelonephritis. Patient now with fevers with temp of 101 and a white count of 19.4, heart rate in the 100s, urinalysis consistent with a UTI. Patient is hemodynamically stable. Admit to telemetry. Check urine cultures. Check blood cultures x2. Check a chest x-ray. Will place empirically on IV Zosyn. Follow.  #2 acute bilateral obstructive uropathy Per CT of the abdomen and pelvis secondary to nephrolithiasis. Patient has been seen by urology and recommend going to the operating room on an urgent basis to place bilateral stents to relieve obstruction and help with pain and infection. Will check blood cultures x2. Urine cultures are pending. Will place empirically on IV Zosyn. Urology following and appreciate input and recommendations.  #3 acute renal failure Likely secondary to post renal azotemia secondary to problem #2. Urinalysis consistent with a UTI. Patient with 30 of protein. CT abdomen and pelvis showing acute bilateral obstructive uropathy with 6.5 mm distal right ureteral calculus resulting in severe right hydroureteronephrosis and  severe right perinephric stranding likely related to fornix rupture. 8 mm distal left ureteral calculus just proximal to the UVJ resulting in severe left hydroureteronephrosis. Will check a urine sodium. Check a urine creatinine. Will hold ACE inhibitor and diuretic. Place on IV fluids. Patient for bilateral stent placement per urology today. Follow renal function.  #4 leukocytosis Likely secondary to urinary tract infection probable pyelonephritis. Urine cultures are pending. Check blood cultures x2. Check a chest x-ray. Placed empirically on IV Zosyn. Follow.  #5 probable pyelonephritis/UTI Urine cultures are pending. Placed empirically on IV Zosyn. IV fluids. Pain management. Supportive care.  #6 recurrent nephrolithiasis Per urology.  #7 diabetes mellitus Hemoglobin A1c was 7.2 on 10/22/2013. Check CBGs a.c. and at bedtime. Place on a sliding scale insulin. Hold oral hypoglycemic agents. Follow.  #8 hypertension Will hold ACE inhibitor and diuretics secondary to problem #3. Place on IV Lopressor. Hydralazine as needed.  #9 tobacco abuse Tobacco cessation. Place on a nicotine patch.  #10 prophylaxis PPI for GI prophylaxis. Lovenox for DVT prophylaxis.   Code Status: Full Family Communication: updated patient. No family at bedside. Disposition Plan: Admit to telemetry  Time spent: [place] MD [facility] Pager [phone]  **Disclaimer: This note may have been dictated with voice recognition software. Similar sounding words can inadvertently be transcribed and this note may contain transcription errors which may not have been corrected upon publication of note.**

## 2013-12-05 NOTE — Progress Notes (Signed)
ANTIBIOTIC CONSULT NOTE - INITIAL  Pharmacy Consult for Zosyn Indication: rule out sepsis  Allergies  Allergen Reactions  . Sulfa Antibiotics Itching and Rash    Patient Measurements: Weight: 184 lb 1.4 oz (83.5 kg)  Vital Signs: Temp: 101 F (38.3 C) (07/07 1041) Temp src: Oral (07/07 1041) BP: 192/77 mmHg (07/07 1041) Pulse Rate: 100 (07/07 1041)  Labs:  Recent Labs  12/05/13 0822  WBC 19.4*  HGB 12.4  PLT 345  CREATININE 1.18*   The CrCl is unknown because both a height and weight (above a minimum accepted value) are required for this calculation.   Microbiology: No results found for this or any previous visit (from the past 720 hour(s)).  Medical History: Past Medical History  Diagnosis Date  . Diabetes mellitus   . Hypertension   . Hyperlipidemia   . Substance abuse   . Thyroid disease   . GERD (gastroesophageal reflux disease)   . Chest pain 10/21/13   . Stroke     mini strokes- 2000  . Sleep apnea     could not handle CPAP- 2012   . Hypothyroidism   . Depression   . Urinary tract infection 10/20/2013   . Kidney stones   . Arthritis     Medications:  Anti-infectives   Start     Dose/Rate Route Frequency Ordered Stop   12/05/13 0930  cefTRIAXone (ROCEPHIN) 1 g in dextrose 5 % 50 mL IVPB     1 g 100 mL/hr over 30 Minutes Intravenous  Once 12/05/13 0929 12/05/13 1019     Assessment: 29 yoF admitted 7/7 for bilateral distal ureteral stones, hydronephrosis, rt kidney forniceal rupture, and suspected UTI.  She has a history of UTI and has been treated several times in the past months per notes.  Pt requiring urgent placement of bilateral ureteral stents.  Ceftriaxone dose given in ED, Pharmacy is consulted to dose Zosyn.   Tmax: 101  WBCs: 19.4  Renal: SCr 1.2, CrCl ~ 58 ml/min  Blood and urine cultures pending.   Goal of Therapy:  Appropriate abx dosing, eradication of infection.   Plan:   Zosyn 3.375g IV Q8H infused over  4hrs.  Follow up renal fxn and culture results.   Gretta Arab PharmD, BCPS Pager [phone] 12/05/2013 11:23 AM

## 2013-12-05 NOTE — Progress Notes (Signed)
Dr. Lissa Hoard made aware of patient's CBG results in PACU-194- no Insulin coverage to be given at this time

## 2013-12-05 NOTE — Progress Notes (Signed)
OT Cancellation Note  Patient Details Name: Sharon Lowe MRN: [id number] DOB: 05/17/1946   Cancelled Treatment:    Reason Eval/Treat Not Completed: Patient at procedure or test/ unavailable  Fathima Bartl 12/05/2013, 2:15 PM Lesle Chris, OTR/L [phone] 12/05/2013

## 2013-12-05 NOTE — Anesthesia Postprocedure Evaluation (Signed)
Anesthesia Post Note  Patient: Sharon Lowe  Procedure(s) Performed: Procedure(s) (LRB): CYSTOSCOPY WITH RETROGRADE PYELOGRAM/URETERAL STENT PLACEMENT (Bilateral)  Anesthesia type: General  Patient location: PACU  Post pain: Pain level controlled  Post assessment: Post-op Vital signs reviewed  Last Vitals: BP 106/55  Pulse 77  Temp(Src) 37.1 C (Oral)  Resp 16  Wt 184 lb 1.4 oz (83.5 kg)  SpO2 99%  Post vital signs: Reviewed  Level of consciousness: sedated  Complications: No apparent anesthesia complications \

## 2013-12-05 NOTE — ED Notes (Signed)
Pt c/o worsening chills upon transfer. Temp rechecked. Increased to 99.9. Will page admitting MD and inform and inform floor RN.

## 2013-12-05 NOTE — Op Note (Signed)
Preoperative diagnosis:  1. Bilateral distal ureteral obstructing stones   Postoperative diagnosis:  1. As above   Procedure:  1. Cystoscopy 2. bilateral ureteral stent placement 3. bilateral retrograde pyelography with interpretation   Surgeon: Ardis Hughs, MD  Anesthesia: General  Complications: None  Intraoperative findings: Left retrograde pyelogram was performed which revealed a normal caliber collecting system however, there was a very brisk hydronephrotic drip suggesting high pressure behind the obstructing stone.  The right retrograde pyelogram revealed a dilated collecting system with moderate hydronephrosis.  EBL: Minimal  Specimens: None  Indication: Sharon Lowe is a 68 y.o. patient with Bilateral distal ureteral stones, obstructing, with evidence of a urinary tract infection. After reviewing the management options for treatment, he elected to proceed with the above surgical procedure(s). We have discussed the potential benefits and risks of the procedure, side effects of the proposed treatment, the likelihood of the patient achieving the goals of the procedure, and any potential problems that might occur during the procedure or recuperation. Informed consent has been obtained.  Description of procedure:  The patient was taken to the operating room and general anesthesia was induced.  The patient was placed in the dorsal lithotomy position, prepped and draped in the usual sterile fashion, and preoperative antibiotics were administered. A preoperative time-out was performed.   Cystourethroscopy was performed.  The patient's urethra was examined and was normal. The bladder was then systematically examined in its entirety. There was no evidence for any bladder tumors, stones, or other mucosal pathology.    Attention then turned to the rightureteral orifice and a ureteral catheter was used to intubate the ureteral orifice.  Omnipaque contrast was injected through the  ureteral catheter and a retrograde pyelogram was performed with findings as dictated above.  A 0.38 sensor guidewire was then advanced up the right ureter into the renal pelvis under fluoroscopic guidance.  -The cystoscope was then removed.  The stent was then advanced over the wire, And was positioned appropriately under fluoroscopic and cystoscopic guidance.  The wire was then removed with an adequate stent curl noted in the renal pelvis as well as in the bladder.  Attention then turned to the leftureteral orifice and a ureteral catheter was used to intubate the ureteral orifice. Given the amount of pressure/degree of hydronephrotic drip I paused and let the kidney drained. Omnipaque contrast was injected through the ureteral catheter and a retrograde pyelogram was performed with findings as dictated above.  A 0.38 sensor guidewire was then advanced up the left ureter into the renal pelvis under fluoroscopic guidance.  The stent was then advanced over the wire and into the left ureter, And it was positioned appropriately under fluoroscopic and cystoscopic guidance.  The wire was then removed with an adequate stent curl noted in the renal pelvis as well as in the bladder.  The bladder was then emptied and the procedure ended.  The patient appeared to tolerate the procedure well and without complications.  The patient was able to be awakened and transferred to the recovery unit in satisfactory condition.   The patient was given a B&O suppository and lidocaine jelly per urethra at the end of the case. Ardis Hughs, M.D.

## 2013-12-05 NOTE — Anesthesia Preprocedure Evaluation (Addendum)
Anesthesia Evaluation  Patient identified by MRN, date of birth, ID band Patient awake    Reviewed: Allergy & Precautions, H&P , NPO status , Patient's Chart, lab work & pertinent test results  Airway Mallampati: II TM Distance: >3 FB Neck ROM: Full    Dental no notable dental hx.    Pulmonary sleep apnea , Current Smoker,  breath sounds clear to auscultation  Pulmonary exam normal       Cardiovascular hypertension, Pt. on medications Rhythm:Regular Rate:Normal     Neuro/Psych PSYCHIATRIC DISORDERS Depression CVA    GI/Hepatic Neg liver ROS, GERD-  ,  Endo/Other  diabetes, Type 2, Oral Hypoglycemic AgentsHypothyroidism   Renal/GU Renal diseaseK 5.2, Cr 1.18  negative genitourinary   Musculoskeletal negative musculoskeletal ROS (+)   Abdominal   Peds negative pediatric ROS (+)  Hematology  (+) anemia ,   Anesthesia Other Findings   Reproductive/Obstetrics negative OB ROS                          Anesthesia Physical Anesthesia Plan  ASA: III  Anesthesia Plan: General   Post-op Pain Management:    Induction: Intravenous and Rapid sequence  Airway Management Planned: Oral ETT  Additional Equipment:   Intra-op Plan:   Post-operative Plan: Extubation in OR  Informed Consent: I have reviewed the patients History and Physical, chart, labs and discussed the procedure including the risks, benefits and alternatives for the proposed anesthesia with the patient or authorized representative who has indicated his/her understanding and acceptance.   Dental advisory given  Plan Discussed with: CRNA  Anesthesia Plan Comments:        Anesthesia Quick Evaluation

## 2013-12-05 NOTE — Consult Note (Signed)
I have been asked to see the patient by Dr. Mirian Mo, for evaluation and management of bilateral obstructing ureteral stones.  History of present illness: This is a 68 year old female who presented to the emergency department with 5 hours of acute flank pain, right greater than left.  She denies any associated fevers or chills.  She denies any changes to her voiding symptoms including dysuria or gross hematuria.The patient has had severe nausea and associated emesis.  In the emergency department the patient was found to have bilateral distal ureteral stones with proximal hydronephrosis.  The right kidney has associated forniceal rupture. In addition, the patient was found to have a urine analysis suspicious for infection.  The patient has a history of urinary tract infections in the past, she has been treated several times over the last several months.  In the ER she was given a shot of Rocephin and a urine culture was sent.    Review of systems: A 12 point comprehensive review of systems was obtained and is negative unless otherwise stated in the history of present illness.  Patient Active Problem List   Diagnosis Date Noted  . UTI (lower urinary tract infection) 12/05/2013  . Expected blood loss anemia 10/31/2013  . Overweight (BMI 25.0-29.9) 10/31/2013  . Hyponatremia 10/31/2013  . S/P left knee revision 10/30/2013  . Chest pain 10/22/2013  . Tobacco abuse 10/22/2013  . Other and unspecified hyperlipidemia 10/22/2013  . Leukocytosis, unspecified 10/22/2013  . 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 DM (diabetes mellitus) for 20 years ID 07/24/2011  . Hypertension 07/24/2011    No current facility-administered medications on file prior to encounter.   Current Outpatient Prescriptions on File Prior to Encounter  Medication Sig Dispense Refill  . atorvastatin (LIPITOR) 10 MG tablet Take 10 mg by mouth every evening.      Marland Kitchen buPROPion (WELLBUTRIN SR) 100 MG 12 hr tablet Take  100 mg by mouth every morning.      . Cyanocobalamin (VITAMIN B-12) 2500 MCG SUBL Place 2,500 mcg under the tongue every morning.       . hydrochlorothiazide (MICROZIDE) 12.5 MG capsule Take 12.5 mg by mouth every morning.      Marland Kitchen levothyroxine (SYNTHROID, LEVOTHROID) 88 MCG tablet Take 88 mcg by mouth daily before breakfast.       . lisinopril (PRINIVIL,ZESTRIL) 10 MG tablet Take 10 mg by mouth every morning.       . loratadine (ALLERGY) 10 MG tablet Take 10 mg by mouth every morning.       . metFORMIN (GLUCOPHAGE) 500 MG tablet Take 500 mg by mouth 2 (two) times daily with a meal.      . Multiple Vitamin (MULTIVITAMIN WITH MINERALS) TABS tablet Take 2 tablets by mouth every morning.       . Omega-3 Fatty Acids (OMEGA-3 FISH OIL) 1200 MG CAPS Take 1 capsule by mouth every morning.      . Soft Lens Products (VISINE FOR CONTACTS) SOLN Place 2 drops into both eyes 3 (three) times daily as needed (dry eyes).         Past Medical History  Diagnosis Date  . Diabetes mellitus   . Hypertension   . Hyperlipidemia   . Substance abuse   . Thyroid disease   . GERD (gastroesophageal reflux disease)   . Chest pain 10/21/13   . Stroke     mini strokes- 2000  . Sleep apnea     could not handle CPAP-  2012   . Hypothyroidism   . Depression   . Urinary tract infection 10/20/2013   . Kidney stones   . Arthritis     Past Surgical History  Procedure Laterality Date  . Abdominal hysterectomy  1995  . Replacement unicondylar joint knee  2004    left  . Rotator cuff repair  2004    left  . Gastric bypass  08/18/10  . Knee arthroscopy      right  . Left knee surgery   2006     repair of broken knee cap   . Massive abdominal infection surgery   1977  . Total knee revision with scar debridement/patella revision with poly exchange Left 10/30/2013    Procedure: LEFT  TOTAL  KNEE  REVISION;  Surgeon: Mauri Pole, MD;  Location: [facility];  Service: Orthopedics;  Laterality: Left;    History  Substance  Use Topics  . Smoking status: Current Every Day Smoker -- 0.50 packs/day for 4 years    Types: Cigarettes  . Smokeless tobacco: Never Used  . Alcohol Use: Yes     Comment: rare     Family History  Problem Relation Age of Onset  . Diabetes Mother   . Hypertension Mother   . Hyperlipidemia Mother   . Heart disease Father   . Cancer Maternal Uncle     prostate, colon  . Cancer Paternal Grandmother     unaware of what kind    PE: Filed Vitals:   12/05/13 0934 12/05/13 1000 12/05/13 1022 12/05/13 1041  BP: 181/78 171/94  192/77  Pulse: 78 92  100  Temp:   99.9 F (37.7 C) 101 F (38.3 C)  TempSrc:   Oral Oral  Resp: 18   18  Weight:    83.5 kg (184 lb 1.4 oz)  SpO2: 99% 96%  97%   Patient appears to be in no acute distress  patient is alert and oriented x3 Atraumatic normocephalic head No cervical or supraclavicular lymphadenopathy appreciated No increased work of breathing, no audible wheezes/rhonchi Regular sinus rhythm/rate, the patient has a systolic ejection murmur Abdomen is soft, mild tenderness diffusely, nondistended, significant right-sided CVA tenderness, no left-sided tenderness. Lower extremities are symmetric without appreciable edema Grossly neurologically intact No identifiable skin lesions   Recent Labs  12/05/13 0822  WBC 19.4*  HGB 12.4  HCT 37.1    Recent Labs  12/05/13 0822  NA 137  K 5.2  CL 101  CO2 20  GLUCOSE 234*  BUN 28*  CREATININE 1.18*  CALCIUM 9.5   No results found for this basename: LABPT, INR,  in the last 72 hours No results found for this basename: LABURIN,  in the last 72 hours Results for orders placed during the hospital encounter of 10/24/13  SURGICAL PCR SCREEN     Status: None   Collection Time    10/24/13 12:33 PM      Result Value Ref Range Status   MRSA, PCR NEGATIVE  NEGATIVE Final   Staphylococcus aureus NEGATIVE  NEGATIVE Final   Comment:            The Xpert SA Assay (FDA     approved for NASAL  specimens     in patients over 31 years of age),     is one component of     a comprehensive surveillance     program.  Test performance has     been validated by [facility] for patients greater     than or equal to 26 year old.     It is not intended     to diagnose infection nor to     guide or monitor treatment.    Imaging: CT scan, abdomen and pelvis, stone protocol: IMPRESSION:  1. There is a 6.5 mm distal right ureteral calculus resulting in  severe right hydroureteronephrosis and severe right perinephric  stranding likely related to fornix rupture.  2. There is an 8 mm distal left ureteral calculus just proximal to  the UVJ resulting in severe left hydroureteronephrosis.  3. Cholelithiasis.  Imp: the patient has bilateral obstructing stones with elevated white blood cell count, poorly controlled pain, and evidence of a urinary tract infection  Recommendations: After reviewing the patient's management options with her I recommended that we proceed to the operating room on an urgent basis to place bilateral stents.  This will relieve the obstruction and help with her pain is well as her infection.  The patient is being admitted by Hospital medicine, I will leave her other medical issues to them for further management.  I discussed the procedure bilateral ureteral stent placement with the patient detail, including the risk and benefits.  She understands that she will need an additional procedure to take care of her stones at a later date once her infection clears.  She also understands that she will have some stent discomfort following the operation.  After answering all the patient's questions he agreed to proceed with the operation. Louis Meckel W

## 2013-12-05 NOTE — Progress Notes (Signed)
Inpatient Diabetes Program Recommendations  AACE/ADA: New Consensus Statement on Inpatient Glycemic Control (2013)  Target Ranges:  Prepandial:   less than 140 mg/dL      Peak postprandial:   less than 180 mg/dL (1-2 hours)      Critically ill patients:  140 - 180 mg/dL  Results for VESTER, TITSWORTH (MRN [id number]) as of 12/05/2013 10:43  Ref. Range 12/05/2013 08:22  Glucose Latest Range: 70-99 mg/dL 234 (H)    Inpatient Diabetes Program Recommendations Correction (SSI): Start moderate scale TID Thank you  Raoul Pitch BSN, RN,CDE Inpatient Diabetes Coordinator [phone] (team pager)

## 2013-12-05 NOTE — Transfer of Care (Signed)
Immediate Anesthesia Transfer of Care Note  Patient: Sharon Lowe  Procedure(s) Performed: Procedure(s): CYSTOSCOPY WITH RETROGRADE PYELOGRAM/URETERAL STENT PLACEMENT (Bilateral)  Patient Location: PACU  Anesthesia Type:General  Level of Consciousness: awake, alert  and oriented  Airway & Oxygen Therapy: Patient Spontanous Breathing and Patient connected to face mask oxygen  Post-op Assessment: Report given to PACU RN and Post -op Vital signs reviewed and stable  Post vital signs: Reviewed and stable  Complications: No apparent anesthesia complications

## 2013-12-05 NOTE — ED Notes (Signed)
Patient transported to CT 

## 2013-12-05 NOTE — ED Notes (Addendum)
Attempted to call report. RN requests 10 min before call back. Urology MD at bedside.

## 2013-12-05 NOTE — ED Provider Notes (Signed)
CSN: [id number]     Arrival date & time 12/05/13  0757 History   First MD Initiated Contact with Patient 12/05/13 0800     Chief Complaint  Patient presents with  . Flank Pain     (Consider location/radiation/quality/duration/timing/severity/associated sxs/prior Treatment) Patient is a 68 y.o. female presenting with flank pain. The history is provided by the patient.  Flank Pain This is a new problem. The current episode started 3 to 5 hours ago. The problem occurs constantly. The problem has not changed since onset.Associated symptoms include abdominal pain (radiating to R flank). Pertinent negatives include no shortness of breath. Nothing aggravates the symptoms. Nothing relieves the symptoms. Treatments tried: vicodin. The treatment provided no relief.    Past Medical History  Diagnosis Date  . Diabetes mellitus   . Hypertension   . Hyperlipidemia   . Substance abuse   . Thyroid disease   . GERD (gastroesophageal reflux disease)   . Chest pain 10/21/13   . Stroke     mini strokes- 2000  . Sleep apnea     could not handle CPAP- 2012   . Hypothyroidism   . Depression   . Urinary tract infection 10/20/2013   . Kidney stones   . Arthritis    Past Surgical History  Procedure Laterality Date  . Abdominal hysterectomy  1995  . Replacement unicondylar joint knee  2004    left  . Rotator cuff repair  2004    left  . Gastric bypass  08/18/10  . Knee arthroscopy      right  . Left knee surgery   2006     repair of broken knee cap   . Massive abdominal infection surgery   1977  . Total knee revision with scar debridement/patella revision with poly exchange Left 10/30/2013    Procedure: LEFT  TOTAL  KNEE  REVISION;  Surgeon: Mauri Pole, MD;  Location: [facility];  Service: Orthopedics;  Laterality: Left;   Family History  Problem Relation Age of Onset  . Diabetes Mother   . Hypertension Mother   . Hyperlipidemia Mother   . Heart disease Father   . Cancer Maternal Uncle      prostate, colon  . Cancer Paternal Grandmother     unaware of what kind   History  Substance Use Topics  . Smoking status: Current Every Day Smoker -- 0.50 packs/day for 4 years    Types: Cigarettes  . Smokeless tobacco: Never Used  . Alcohol Use: Yes     Comment: rare    OB History   Grav Para Term Preterm Abortions TAB SAB Ect Mult Living                 Review of Systems  Constitutional: Negative for fever.  Respiratory: Negative for cough and shortness of breath.   Gastrointestinal: Positive for nausea, vomiting and abdominal pain (radiating to R flank). Negative for diarrhea.  Genitourinary: Positive for flank pain.  All other systems reviewed and are negative.     Allergies  Sulfa antibiotics  Home Medications   Prior to Admission medications   Medication Sig Start Date End Date Taking? Authorizing Provider  atorvastatin (LIPITOR) 10 MG tablet Take 10 mg by mouth every evening.    Historical Provider, MD  buPROPion (WELLBUTRIN SR) 100 MG 12 hr tablet Take 100 mg by mouth every morning.    Historical Provider, MD  CALCIUM-VITAMIN D PO Take 1,200 mg by mouth daily.  Historical Provider, MD  Cyanocobalamin (VITAMIN B-12) 2500 MCG SUBL Place 2,500 mcg under the tongue daily.    Historical Provider, MD  docusate sodium 100 MG CAPS Take 100 mg by mouth 2 (two) times daily. 11/02/13   Lucille Passy Babish, PA-C  doxycycline (DORYX) 100 MG DR capsule Take 100 mg by mouth 2 (two) times daily. For 7 days patient started on 10/20/13 for urinary tract infection    Historical Provider, MD  ferrous sulfate 325 (65 FE) MG tablet Take 1 tablet (325 mg total) by mouth 3 (three) times daily after meals. 11/02/13   Lucille Passy Babish, PA-C  hydrochlorothiazide (MICROZIDE) 12.5 MG capsule Take 12.5 mg by mouth every morning.    Historical Provider, MD  HYDROcodone-acetaminophen (NORCO) 7.5-325 MG per tablet Take 1-2 tablets by mouth every 4 (four) hours as needed for moderate pain. 11/02/13    Lucille Passy Babish, PA-C  levothyroxine (SYNTHROID, LEVOTHROID) 88 MCG tablet Take 88 mcg by mouth daily before breakfast.  12/31/10   Historical Provider, MD  lisinopril (PRINIVIL,ZESTRIL) 10 MG tablet Take 10 mg by mouth every morning.     Historical Provider, MD  loratadine (ALLERGY) 10 MG tablet Take 10 mg by mouth daily.      Historical Provider, MD  metFORMIN (GLUCOPHAGE) 500 MG tablet Take 500 mg by mouth 2 (two) times daily with a meal.    Historical Provider, MD  Multiple Vitamin (MULTIVITAMIN WITH MINERALS) TABS tablet Take 2 tablets by mouth every morning. gummy    Historical Provider, MD  Omega-3 Fatty Acids (OMEGA-3 FISH OIL) 1200 MG CAPS Take 1 capsule by mouth every morning.    Historical Provider, MD  pantoprazole (PROTONIX) 40 MG tablet Take 1 tablet (40 mg total) by mouth 2 (two) times daily. 10/23/13   Erline Hau, MD  polyethylene glycol [facility] / Floria Raveling) packet Take 17 g by mouth 2 (two) times daily. 11/02/13   Lucille Passy Babish, PA-C  Soft Lens Products (VISINE FOR CONTACTS) SOLN Place 4 drops into both eyes 3 (three) times daily as needed (dry eyes).    Historical Provider, MD  tiZANidine (ZANAFLEX) 4 MG capsule Take 1 capsule (4 mg total) by mouth 3 (three) times daily as needed for muscle spasms. 11/02/13   Lucille Passy Babish, PA-C   BP 194/86  Pulse 79  Temp(Src) 98.9 F (37.2 C) (Oral)  Resp 18  SpO2 97% Physical Exam  Nursing note and vitals reviewed. Constitutional: She is oriented to person, place, and time. She appears well-developed and well-nourished. No distress.  HENT:  Head: Normocephalic and atraumatic.  Mouth/Throat: Oropharynx is clear and moist.  Eyes: EOM are normal. Pupils are equal, round, and reactive to light.  Neck: Normal range of motion. Neck supple.  Cardiovascular: Normal rate and regular rhythm.  Exam reveals no friction rub.   No murmur heard. Pulmonary/Chest: Effort normal and breath sounds normal. No respiratory  distress. She has no wheezes. She has no rales.  Abdominal: Soft. She exhibits no distension. There is tenderness (R flank). There is no rebound and no guarding.  Musculoskeletal: Normal range of motion. She exhibits no edema.  Neurological: She is alert and oriented to person, place, and time. No cranial nerve deficit. She exhibits normal muscle tone. Coordination normal.  Skin: Skin is warm. No rash noted. She is not diaphoretic.    ED Course  Procedures (including critical care time) Labs Review Labs Reviewed  CBC  COMPREHENSIVE METABOLIC PANEL  LIPASE, BLOOD  URINALYSIS, ROUTINE  W REFLEX MICROSCOPIC    Imaging Review Ct Abdomen Pelvis Wo Contrast  12/05/2013   CLINICAL DATA:  Flank pain, history of renal calculi  EXAM: CT ABDOMEN AND PELVIS WITHOUT CONTRAST  TECHNIQUE: Multidetector CT imaging of the abdomen and pelvis was performed following the standard protocol without IV contrast.  COMPARISON:  07/01/2012  FINDINGS: The lung bases are clear.  There is a 6.5 mm distal right ureteral calculus resulting in severe right hydroureteronephrosis and severe right perinephric stranding likely related to fornix rupture. There is an 8 mm distal left ureteral calculus just proximal to the UVJ resulting in severe left hydroureteronephrosis. There is right nephromegaly. There is no exophytic renal mass. The bladder is unremarkable.  The liver demonstrates no focal abnormality. There are cholelithiasis. The spleen demonstrates no focal abnormality. The adrenal glands and pancreas are normal.  There is evidence of prior gastric bypass surgery with postsurgical changes in the epigastric region. The unopacified duodenum, small intestine and large intestine are unremarkable, but evaluation is limited by lack of oral contrast. There is no bowel obstruction. There is no pneumoperitoneum, pneumatosis, or portal venous gas. There is no abdominal or pelvic free fluid. There is no lymphadenopathy.  The abdominal  aorta is normal in caliber with atherosclerosis.  There is lumbar spine spondylosis.  IMPRESSION: 1. There is a 6.5 mm distal right ureteral calculus resulting in severe right hydroureteronephrosis and severe right perinephric stranding likely related to fornix rupture. 2. There is an 8 mm distal left ureteral calculus just proximal to the UVJ resulting in severe left hydroureteronephrosis. 3. Cholelithiasis.   Electronically Signed   By: Kathreen Devoid   On: 12/05/2013 08:49     EKG Interpretation None      MDM   Final diagnoses:  Pyelonephritis  Acute bilateral obstructive uropathy    63F with multiple medical problems including DM, HTN, HLD presents with R flank pain. Awoke her from sleep. Similar to prior pain associated with kidney stones, which was roughly one year ago. Review of prior CT scans showed a 2 mm right UPJ stone previously. No previous aortic aneurysm noted in her scan from last year. Here hypertensive, otherwise vitals stable. On exam she has right flank pain without any other abdominal pain. Concern for likely kidney stone. We'll give pain medicine, check labs. CT scan ordered. CT shows bilateral obstructive uropathy with severe hydro and possible fornix rupture on the right. Labs with UTI, leukocytosis - rocephin given. Herrick with Urology will see and operate today. Dr. Grandville Silos with [facility] will admit. I have reviewed all labs and imaging and considered them in my medical decision making.   Osvaldo Shipper, MD 12/05/13 1000

## 2013-12-05 NOTE — ED Notes (Signed)
Bed: ID78 Expected date:  Expected time:  Means of arrival:  Comments: EMS-flank pain

## 2013-12-06 ENCOUNTER — Encounter (HOSPITAL_COMMUNITY): Payer: Self-pay | Admitting: Urology

## 2013-12-06 DIAGNOSIS — N139 Obstructive and reflux uropathy, unspecified: Secondary | ICD-10-CM | POA: Diagnosis not present

## 2013-12-06 DIAGNOSIS — N179 Acute kidney failure, unspecified: Secondary | ICD-10-CM | POA: Diagnosis not present

## 2013-12-06 DIAGNOSIS — R109 Unspecified abdominal pain: Secondary | ICD-10-CM | POA: Diagnosis not present

## 2013-12-06 DIAGNOSIS — I10 Essential (primary) hypertension: Secondary | ICD-10-CM | POA: Diagnosis not present

## 2013-12-06 DIAGNOSIS — N201 Calculus of ureter: Secondary | ICD-10-CM | POA: Diagnosis not present

## 2013-12-06 DIAGNOSIS — N1330 Unspecified hydronephrosis: Secondary | ICD-10-CM | POA: Diagnosis not present

## 2013-12-06 DIAGNOSIS — R112 Nausea with vomiting, unspecified: Secondary | ICD-10-CM | POA: Diagnosis not present

## 2013-12-06 DIAGNOSIS — N10 Acute tubulo-interstitial nephritis: Secondary | ICD-10-CM | POA: Diagnosis not present

## 2013-12-06 LAB — COMPREHENSIVE METABOLIC PANEL
ALT: 19 U/L (ref 0–35)
AST: 22 U/L (ref 0–37)
Albumin: 2.1 g/dL — ABNORMAL LOW (ref 3.5–5.2)
Alkaline Phosphatase: 41 U/L (ref 39–117)
Anion gap: 12 (ref 5–15)
BUN: 33 mg/dL — AB (ref 6–23)
CALCIUM: 8.7 mg/dL (ref 8.4–10.5)
CHLORIDE: 102 meq/L (ref 96–112)
CO2: 23 meq/L (ref 19–32)
CREATININE: 1.54 mg/dL — AB (ref 0.50–1.10)
GFR calc Af Amer: 39 mL/min — ABNORMAL LOW (ref >90)
GFR, EST NON AFRICAN AMERICAN: 34 mL/min — AB (ref >90)
Glucose, Bld: 153 mg/dL — ABNORMAL HIGH (ref 70–99)
Potassium: 4.1 mEq/L (ref 3.7–5.3)
Sodium: 137 mEq/L (ref 137–147)
Total Bilirubin: 0.3 mg/dL (ref 0.3–1.2)
Total Protein: 5.1 g/dL — ABNORMAL LOW (ref 6.0–8.3)

## 2013-12-06 LAB — CBC WITH DIFFERENTIAL/PLATELET
BASOS ABS: 0 10*3/uL (ref 0.0–0.1)
Basophils Relative: 0 % (ref 0–1)
EOS PCT: 1 % (ref 0–5)
Eosinophils Absolute: 0.1 10*3/uL (ref 0.0–0.7)
HCT: 29.8 % — ABNORMAL LOW (ref 36.0–46.0)
Hemoglobin: 9.8 g/dL — ABNORMAL LOW (ref 12.0–15.0)
LYMPHS PCT: 12 % (ref 12–46)
Lymphs Abs: 1.9 10*3/uL (ref 0.7–4.0)
MCH: 32.2 pg (ref 26.0–34.0)
MCHC: 32.9 g/dL (ref 30.0–36.0)
MCV: 98 fL (ref 78.0–100.0)
Monocytes Absolute: 1.5 10*3/uL — ABNORMAL HIGH (ref 0.1–1.0)
Monocytes Relative: 9 % (ref 3–12)
NEUTROS PCT: 78 % — AB (ref 43–77)
Neutro Abs: 12.3 10*3/uL — ABNORMAL HIGH (ref 1.7–7.7)
PLATELETS: 309 10*3/uL (ref 150–400)
RBC: 3.04 MIL/uL — ABNORMAL LOW (ref 3.87–5.11)
RDW: 16.1 % — ABNORMAL HIGH (ref 11.5–15.5)
WBC: 15.8 10*3/uL — AB (ref 4.0–10.5)

## 2013-12-06 LAB — GLUCOSE, CAPILLARY: GLUCOSE-CAPILLARY: 149 mg/dL — AB (ref 70–99)

## 2013-12-06 MED ORDER — ACETAMINOPHEN-CODEINE #3 300-30 MG PO TABS: 1.0000 | ORAL_TABLET | ORAL | Status: DC | PRN

## 2013-12-06 MED ORDER — CIPROFLOXACIN HCL 500 MG PO TABS: 500.0000 mg | ORAL_TABLET | Freq: Once | ORAL | Status: DC

## 2013-12-06 MED ORDER — PHENAZOPYRIDINE HCL 200 MG PO TABS: 200.0000 mg | ORAL_TABLET | Freq: Three times a day (TID) | ORAL | Status: DC | PRN

## 2013-12-06 NOTE — Discharge Summary (Signed)
Physician Discharge Summary  Sharon Lowe VPX:[id number] DOB: Oct 09, 1945 DOA: 12/05/2013  PCP: Mathews Argyle, MD  Admit date: 12/05/2013 Discharge date: 12/06/2013  Recommendations for Outpatient Follow-up:  Urology will schedule you for bilateral ureteroscopy to remove stones in 2-4 weeks. Please note we stopped Microzide, Lisinopril and Metformin due to worsening kidney function. I spoke with your PCP who recommended continuing these medications and you can follow up in 1 week and they will recheck kidney function at that time.  Discharge Diagnoses:  Principal Problem:   Sepsis Active Problems:   DM (diabetes mellitus) for 20 years ID   Hypertension   Tobacco abuse   Other and unspecified hyperlipidemia   Leukocytosis, unspecified   Overweight (BMI 25.0-29.9)   UTI (lower urinary tract infection)   Acute pyelonephritis: Probable   Acute bilateral obstructive uropathy   ARF (acute renal failure)   Recurrent nephrolithiasis    Discharge Condition: stable   Diet recommendation: as tolerated   History of present illness:  68 y.o. female with past medical history of diabetes, dyslipidemia, nephrolithiasis, depression, h/o CVA who presented to [facility] ED 12/05/2013 with right flank pain started on the morning (around 1 am) of this admission. No associated fevers or chills. No associated hematuria.  Work up in ED revealed UTI. CT abdomen revealed 6.5 mm distal right ureteral calculus resulting in severe right hydroureteronephrosis and severe right perinephric stranding likely related to fornix rupture;  8 mm distal left ureteral calculus just proximal to the UVJ resulting in severe left hydroureteronephrosis.  Pt underwent bilateral ureteral stent placement for obstructing distal ureteral stones.   Hospital Course:   Principal Problem: UTI secondary to Klebsiella - likely exacerbated by obstructing distal ureteral stones.  - urine culture grew Klebsiella sensitive to cipro - pt is  status post bilateral stent placement - per GU ok that she goes home; she will be transitioned to cipro for 10 days on discharge. Prescription provided. - she will follow up with GU per scheduled appt. - regarding other medical comorbidities it is safe to continue taking all other home medications.   Active Problems; Acute renal failure - likely exacerbated by obstruction at the ureters; will likely improve with stent placement  - will be followed up outpatient   Signed:  Leisa Lenz, MD  [facility] 12/06/2013, 9:59 AM  Pager #: [phone]  Procedures:  Cystoscopy, bilateral ureteral stent placement, bilateral retrograde pyelography   Consultations:  Urology (Dr. Louis Meckel)   Discharge Exam: Filed Vitals:   12/06/13 0525  BP: 124/60  Pulse: 78  Temp: 98.3 F (36.8 C)  Resp: 18   Filed Vitals:   12/05/13 1450 12/05/13 2237 12/05/13 2300 12/06/13 0525  BP: 106/55 104/50  124/60  Pulse: 77 79  78  Temp: 98.8 F (37.1 C) 100.4 F (38 C) 99.4 F (37.4 C) 98.3 F (36.8 C)  TempSrc:  Oral  Oral  Resp: 16 18  18   Weight:    96 kg (211 lb 10.3 oz)  SpO2: 99% 99%  95%    General: Pt is alert, follows commands appropriately, not in acute distress Cardiovascular: Regular rate and rhythm, S1/S2 +, no murmurs Respiratory: Clear to auscultation bilaterally, no wheezing, no crackles, no rhonchi Abdominal: Soft, non tender, non distended, bowel sounds +, no guarding Extremities: no edema, no cyanosis, pulses palpable bilaterally DP and PT Neuro: Grossly nonfocal  Discharge Instructions  Discharge Instructions   Call MD for:  difficulty breathing, headache or visual disturbances    Complete  by:  As directed      Call MD for:  persistant dizziness or light-headedness    Complete by:  As directed      Call MD for:  persistant nausea and vomiting    Complete by:  As directed      Call MD for:  severe uncontrolled pain    Complete by:  As directed      Diet - low  sodium heart healthy    Complete by:  As directed      Discharge instructions    Complete by:  As directed   Urology will schedule you for bilateral ureteroscopy to remove stones in 2-4 weeks. Please note we stopped Microzide, Lisinopril and Metformin due to worsening kidney function. I spoke with your PCP who recommended continuing these medications and you can follow up in 1 week and they will recheck kidney function at that time.     Increase activity slowly    Complete by:  As directed             Medication List    STOP taking these medications       HYDROcodone-acetaminophen 7.5-325 MG per tablet  Commonly known as:  [place] these medications       acetaminophen-codeine 300-30 MG per tablet  Commonly known as:  TYLENOL #3  Take 1 tablet by mouth every 4 (four) hours as needed.     ALLERGY 10 MG tablet  Generic drug:  loratadine  Take 10 mg by mouth every morning.     aspirin 325 MG tablet  Take 325 mg by mouth 2 (two) times daily.     atorvastatin 10 MG tablet  Commonly known as:  LIPITOR  Take 10 mg by mouth every evening.     buPROPion 100 MG 12 hr tablet  Commonly known as:  WELLBUTRIN SR  Take 100 mg by mouth every morning.     CALCIUM 600+D 600-800 MG-UNIT Tabs  Generic drug:  Calcium Carb-Cholecalciferol  Take 1 tablet by mouth 2 (two) times daily.     ciprofloxacin 500 MG tablet  Commonly known as:  CIPRO  Take 1 tablet (500 mg total) by mouth once.     hydrochlorothiazide 12.5 MG capsule  Commonly known as:  MICROZIDE  Take 12.5 mg by mouth every morning.     levothyroxine 88 MCG tablet  Commonly known as:  SYNTHROID, LEVOTHROID  Take 88 mcg by mouth daily before breakfast.     lisinopril 10 MG tablet  Commonly known as:  PRINIVIL,ZESTRIL  Take 10 mg by mouth every morning.     metFORMIN 500 MG tablet  Commonly known as:  GLUCOPHAGE  Take 500 mg by mouth 2 (two) times daily with a meal.     multivitamin with minerals Tabs tablet   Take 2 tablets by mouth every morning.     Omega-3 Fish Oil 1200 MG Caps  Take 1 capsule by mouth every morning.     pantoprazole 40 MG tablet  Commonly known as:  PROTONIX  Take 40 mg by mouth 2 (two) times daily.     phenazopyridine 200 MG tablet  Commonly known as:  PYRIDIUM  Take 1 tablet (200 mg total) by mouth 3 (three) times daily as needed for pain.     tiZANidine 4 MG tablet  Commonly known as:  ZANAFLEX  Take 4 mg by mouth 3 (three) times daily as needed for muscle spasms.  VISINE FOR CONTACTS Soln  Place 2 drops into both eyes 3 (three) times daily as needed (dry eyes).     Vitamin B-12 2500 MCG Subl  Place 2,500 mcg under the tongue every morning.           Follow-up Information   Follow up with Ardis Hughs, MD In 1 week.   Specialty:  Urology   Contact information:   [place] [address] [phone]       Follow up with Mathews Argyle, MD. Schedule an appointment as soon as possible for a visit in 1 week. (Follow up appt after recent hospitalization; recheck kidney function )    Specialty:  Internal Medicine   Contact information:   [address] [place] [address] [phone]        The results of significant diagnostics from this hospitalization (including imaging, microbiology, ancillary and laboratory) are listed below for reference.    Significant Diagnostic Studies: Ct Abdomen Pelvis Wo Contrast 12/05/2013    IMPRESSION: 1. There is a 6.5 mm distal right ureteral calculus resulting in severe right hydroureteronephrosis and severe right perinephric stranding likely related to fornix rupture. 2. There is an 8 mm distal left ureteral calculus just proximal to the UVJ resulting in severe left hydroureteronephrosis. 3. Cholelithiasis.   Electronically Signed   By: Kathreen Devoid   On: 12/05/2013 08:49   Dg Chest Port 1 View 12/05/2013   IMPRESSION: No edema or consolidation.   Electronically Signed   By:  Lowella Grip M.D.   On: 12/05/2013 11:43    Microbiology: SURGICAL PCR SCREEN     Status: None   Collection Time    12/05/13 10:48 AM      Result Value Ref Range Status   MRSA, PCR NEGATIVE  NEGATIVE Final   Staphylococcus aureus NEGATIVE  NEGATIVE Final  CULTURE, BLOOD (ROUTINE X 2)     Status: None   Collection Time    12/05/13 10:57 AM      Result Value Ref Range Status   Specimen Description BLOOD RIGHT ARM   Final   Special Requests BOTTLES DRAWN AEROBIC AND ANAEROBIC 10CC EACH   Final   Culture  Setup Time     Final   Value: 12/05/2013 14:21     Performed at [facility]   Culture     Final   Value:        BLOOD CULTURE RECEIVED NO GROWTH TO DATE CULTURE WILL BE HELD FOR 5 DAYS BEFORE ISSUING A FINAL NEGATIVE REPORT     Performed at [facility]   Report Status PENDING   Incomplete  CULTURE, BLOOD (ROUTINE X 2)     Status: None   Collection Time    12/05/13 11:01 AM      Result Value Ref Range Status   Specimen Description BLOOD RIGHT HAND   Final   Special Requests BOTTLES DRAWN AEROBIC AND ANAEROBIC 5CC EACH   Final   Culture  Setup Time     Final   Value: 12/05/2013 14:21     Performed at [facility]   Culture     Final   Value:        BLOOD CULTURE RECEIVED NO GROWTH TO DATE CULTURE WILL BE HELD FOR 5 DAYS BEFORE ISSUING A FINAL NEGATIVE REPORT     Performed at [facility]   Report Status PENDING   Incomplete     Labs:  Basic Metabolic Panel:  Recent Labs Lab 12/05/13 0815 12/05/13 0822 12/06/13 0445  NA  --  137 137  K  --  5.2 4.1  CL  --  101 102  CO2  --  20 23  GLUCOSE  --  234* 153*  BUN  --  28* 33*  CREATININE  --  1.18* 1.54*  CALCIUM  --  9.5 8.7  MG 1.6  --   --    Liver Function Tests:  Recent Labs Lab 12/05/13 0822 12/06/13 0445  AST 53* 22  ALT 29 19  ALKPHOS 57 41  BILITOT 0.4 0.3  PROT 6.9 5.1*  ALBUMIN 3.0* 2.1*    Recent Labs Lab 12/05/13 0822  LIPASE 55   No results found for  this basename: AMMONIA,  in the last 168 hours CBC:  Recent Labs Lab 12/05/13 0822 12/06/13 0445  WBC 19.4* 15.8*  NEUTROABS  --  12.3*  HGB 12.4 9.8*  HCT 37.1 29.8*  MCV 96.9 98.0  PLT 345 309   Cardiac Enzymes: No results found for this basename: CKTOTAL, CKMB, CKMBINDEX, TROPONINI,  in the last 168 hours BNP: BNP (last 3 results) No results found for this basename: PROBNP,  in the last 8760 hours CBG:  Recent Labs Lab 12/05/13 1323 12/05/13 1654 12/05/13 2222 12/06/13 0711  GLUCAP 194* 233* 144* 149*    Time coordinating discharge: Over 30 minutes

## 2013-12-06 NOTE — Progress Notes (Signed)
PT Cancellation Note  Patient Details Name: ERIAN LARIVIERE MRN: [id number] DOB: [birth year]   Cancelled Treatment:    Reason Eval/Treat Not Completed: PT screened, no needs identified, will sign off--MD didn't feel PT eval was needed any longer. Pt denied need for eval as well   Weston Anna, MPT Pager: [phone]

## 2013-12-06 NOTE — Discharge Instructions (Signed)
DISCHARGE INSTRUCTIONS FOR KIDNEY STONE/URETERAL STENT   MEDICATIONS:  1.  Resume all your other meds from home - except do not take any extra narcotic pain meds that you may have at home.  2. Trospium is to prevent bladder spasms and help reduce urinary frequency. 3. Pyridium is to help with the burning/stinging when you urinate. 4. Tylenol with codeine is for moderate/severe pain, otherwise taking upto 1000mg  every 6 hours of plainTylenol will help treat your pain.  Do not take both at the same time. 5. Take Cipro until prescription is completed.  ACTIVITY:  1. No strenuous activity x 1week  2. No driving while on narcotic pain medications  3. Drink plenty of water  4. Continue to walk at home - you can still get blood clots when you are at home, so keep active, but don't over do it.  5. May return to work/school tomorrow or when you feel ready   BATHING:  1. You can shower and we recommend daily showers     SIGNS/SYMPTOMS TO CALL:  Please call us if you have a fever greater than 101.5, uncontrolled nausea/vomiting, uncontrolled pain, dizziness, unable to urinate, bloody urine, chest pain, shortness of breath, leg swelling, leg pain, redness around wound, drainage from wound, or any other concerns or questions.   You can reach [place] at [phone].   FOLLOW-UP:  1. We will get you scheduled for removal of your ureteral stones in 2-4 weeks.

## 2013-12-06 NOTE — Progress Notes (Signed)
Urology Inpatient Progress Report S/p bilateral ureteral stent placement for obstructing distal ureteral stones  Intv/Subj: Tolerated the procedure well - afebrile following, pain significantly improved No acute events overnight. Patient is without complaint.  Denies stent discomfort No n/v or f/c  Past Medical History  Diagnosis Date  . Diabetes mellitus   . Hypertension   . Hyperlipidemia   . Substance abuse   . Thyroid disease   . GERD (gastroesophageal reflux disease)   . Chest pain 10/21/13   . Stroke     mini strokes- 2000  . Sleep apnea     could not handle CPAP- 2012   . Hypothyroidism   . Depression   . Urinary tract infection 10/20/2013   . Kidney stones   . Arthritis    Current Facility-Administered Medications  Medication Dose Route Frequency Provider Last Rate Last Dose  . 0.9 %  sodium chloride infusion   Intravenous Continuous Ardis Hughs, MD 100 mL/hr at 12/05/13 1500    . acetaminophen (TYLENOL) tablet 650 mg  650 mg Oral Q6H PRN Eugenie Filler, MD       Or  . acetaminophen (TYLENOL) suppository 650 mg  650 mg Rectal Q6H PRN Eugenie Filler, MD      . acetaminophen (TYLENOL) tablet 650 mg  650 mg Oral Q4H PRN Eugenie Filler, MD   650 mg at 12/05/13 1112  . albuterol (PROVENTIL) (2.5 MG/3ML) 0.083% nebulizer solution 2.5 mg  2.5 mg Nebulization Q2H PRN Eugenie Filler, MD      . aspirin tablet 325 mg  325 mg Oral BID Eugenie Filler, MD   325 mg at 12/05/13 2238  . atorvastatin (LIPITOR) tablet 10 mg  10 mg Oral QPM Eugenie Filler, MD   10 mg at 12/05/13 1735  . buPROPion (WELLBUTRIN SR) 12 hr tablet 100 mg  100 mg Oral q morning - 10a Eugenie Filler, MD      . docusate sodium (COLACE) capsule 100 mg  100 mg Oral BID Eugenie Filler, MD   100 mg at 12/05/13 2238  . enoxaparin (LOVENOX) injection 40 mg  40 mg Subcutaneous Daily Irine Seal V, MD      . hydrALAZINE (APRESOLINE) injection 10 mg  10 mg Intravenous Q6H PRN Eugenie Filler, MD      . HYDROcodone-acetaminophen [facility]) 7.5-325 MG per tablet 1-2 tablet  1-2 tablet Oral Q4H PRN Eugenie Filler, MD      . insulin aspart (novoLOG) injection 0-15 Units  0-15 Units Subcutaneous TID WC Eugenie Filler, MD   5 Units at 12/05/13 1734  . ipratropium (ATROVENT) nebulizer solution 0.5 mg  0.5 mg Nebulization Q2H PRN Eugenie Filler, MD      . levothyroxine (SYNTHROID, LEVOTHROID) tablet 88 mcg  88 mcg Oral QAC breakfast Eugenie Filler, MD      . loratadine (CLARITIN) tablet 10 mg  10 mg Oral q morning - 10a Eugenie Filler, MD      . metoprolol (LOPRESSOR) injection 5 mg  5 mg Intravenous 3 times per day Eugenie Filler, MD   5 mg at 12/06/13 0520  . morphine 2 MG/ML injection 2-4 mg  2-4 mg Intravenous Q4H PRN Eugenie Filler, MD      . multivitamin with minerals tablet 2 tablet  2 tablet Oral q morning - 10a Eugenie Filler, MD      . naphazoline (NAPHCON) 0.1 % ophthalmic solution 2 drop  2 drop Both Eyes TID PRN Eugenie Filler, MD      . nicotine (NICODERM CQ - dosed in mg/24 hours) patch 21 mg  21 mg Transdermal Daily Irine Seal V, MD      . omega-3 acid ethyl esters (LOVAZA) capsule 1 g  1 g Oral BID Eugenie Filler, MD   1 g at 12/05/13 2238  . ondansetron (ZOFRAN) tablet 4 mg  4 mg Oral Q6H PRN Eugenie Filler, MD       Or  . ondansetron [facility]) injection 4 mg  4 mg Intravenous Q6H PRN Eugenie Filler, MD      . pantoprazole (PROTONIX) EC tablet 40 mg  40 mg Oral BID Eugenie Filler, MD   40 mg at 12/05/13 2238  . piperacillin-tazobactam (ZOSYN) IVPB 3.375 g  3.375 g Intravenous Q8H Christine E Shade, RPH   3.375 g at 12/06/13 0520  . senna-docusate (Senokot-S) tablet 1 tablet  1 tablet Oral QHS PRN Irine Seal V, MD      . sodium chloride 0.9 % injection 3 mL  3 mL Intravenous Q12H Eugenie Filler, MD   3 mL at 12/05/13 1130  . sorbitol 70 % solution 30 mL  30 mL Oral Daily PRN Eugenie Filler, MD      . tiZANidine  (ZANAFLEX) tablet 4 mg  4 mg Oral TID PRN Eugenie Filler, MD   4 mg at 12/05/13 1454  . vitamin B-12 (CYANOCOBALAMIN) tablet 1,000 mcg  1,000 mcg Oral Daily Irine Seal V, MD   1,000 mcg at 12/05/13 1500     Objective: Vital: Filed Vitals:   12/05/13 1450 12/05/13 2237 12/05/13 2300 12/06/13 0525  BP: 106/55 104/50  124/60  Pulse: 77 79  78  Temp: 98.8 F (37.1 C) 100.4 F (38 C) 99.4 F (37.4 C) 98.3 F (36.8 C)  TempSrc:  Oral  Oral  Resp: 16 18  18   Weight:    96 kg (211 lb 10.3 oz)  SpO2: 99% 99%  95%   I/Os: I/O last 3 completed shifts: In: 2940 [P.O.:1840; I.V.:1000; IV Piggyback:100] Out: 1300 [Urine:1300]  Physical Exam:  General: Patient is in no apparent distress Lungs: Normal respiratory effort, chest expands symmetrically. GI: The abdomen is soft and nontender without mass. Ext: lower extremities symmetric  Lab Results:  Recent Labs  12/05/13 0822 12/06/13 0445  WBC 19.4* 15.8*  HGB 12.4 9.8*  HCT 37.1 29.8*    Recent Labs  12/05/13 0822 12/06/13 0445  NA 137 137  K 5.2 4.1  CL 101 102  CO2 20 23  GLUCOSE 234* 153*  BUN 28* 33*  CREATININE 1.18* 1.54*  CALCIUM 9.5 8.7   No results found for this basename: LABPT, INR,  in the last 72 hours No results found for this basename: LABURIN,  in the last 72 hours Results for orders placed during the hospital encounter of 12/05/13  SURGICAL PCR SCREEN     Status: None   Collection Time    12/05/13 10:48 AM      Result Value Ref Range Status   MRSA, PCR NEGATIVE  NEGATIVE Final   Staphylococcus aureus NEGATIVE  NEGATIVE Final   Comment:            The Xpert SA Assay (FDA     approved for NASAL specimens     in patients over 64 years of age),     is one component of  a comprehensive surveillance     program.  Test performance has     been validated by [facility] for patients greater     than or equal to 64 year old.     It is not intended     to diagnose infection nor to      guide or monitor treatment.    Studies/Results: No new studies  Assessment: 1 Day Post-Op bilateral ureteral stent placement, creatinine up slightly.  Plan: Doing well, no evidence of urosepsis.   Based on prior urine cultures, ok to transition to cipro, will need 10 day course From my prospective she can be discharged home today.  I will schedule her for bilateral ureteroscopy to remove her stones in 2-4 weeks.   Louis Meckel W 12/06/2013, 8:20 AM

## 2013-12-06 NOTE — Care Management Note (Signed)
    Page 1 of 1   12/06/2013     12:28:46 PM CARE MANAGEMENT NOTE 12/06/2013  Patient:  Sharon Lowe, Sharon Lowe   Account Number:  [id number]  Date Initiated:  12/06/2013  Documentation initiated by:  Dessa Phi  Subjective/Objective Assessment:   68 Y/O F ADMITTED W/SEPSIS.     Action/Plan:   FROM HOME.   Anticipated DC Date:  12/06/2013   Anticipated DC Plan:  [place]  CM consult      Choice offered to / List presented to:             Status of service:  Completed, signed off Medicare Important Message given?   (If response is "NO", the following Medicare IM given date fields will be blank) Date Medicare IM given:   Medicare IM given by:   Date Additional Medicare IM given:   Additional Medicare IM given by:    Discharge Disposition:  HOME/SELF CARE  Per UR Regulation:  Reviewed for med. necessity/level of care/duration of stay  If discussed at [place] of Stay Meetings, dates discussed:    Comments:  12/06/13 Makayle Krahn RN,BSN NCM [phone] NO D/C NEEDS OR ORDERS.

## 2013-12-06 NOTE — Progress Notes (Signed)
Patient discharged home, discharge instructions given and explained to patient/daugter and they verbalized understanding, denies any pain/distress, accompanied home by daughter. Skin intact, no wound noted. Old surgical incision on left knee, clean,dry and intact and healed.

## 2013-12-07 ENCOUNTER — Other Ambulatory Visit: Payer: Self-pay | Admitting: Urology

## 2013-12-07 LAB — URINE CULTURE: SPECIAL REQUESTS: NORMAL

## 2013-12-11 LAB — CULTURE, BLOOD (ROUTINE X 2)
Culture: NO GROWTH
Culture: NO GROWTH

## 2013-12-12 DIAGNOSIS — T84099A Other mechanical complication of unspecified internal joint prosthesis, initial encounter: Secondary | ICD-10-CM | POA: Diagnosis not present

## 2013-12-13 ENCOUNTER — Encounter: Payer: Self-pay | Admitting: *Deleted

## 2013-12-14 DIAGNOSIS — Z96659 Presence of unspecified artificial knee joint: Secondary | ICD-10-CM | POA: Diagnosis not present

## 2013-12-14 DIAGNOSIS — Z471 Aftercare following joint replacement surgery: Secondary | ICD-10-CM | POA: Diagnosis not present

## 2013-12-14 DIAGNOSIS — T84099A Other mechanical complication of unspecified internal joint prosthesis, initial encounter: Secondary | ICD-10-CM | POA: Diagnosis not present

## 2013-12-15 ENCOUNTER — Encounter (HOSPITAL_BASED_OUTPATIENT_CLINIC_OR_DEPARTMENT_OTHER): Payer: Self-pay | Admitting: *Deleted

## 2013-12-15 DIAGNOSIS — I129 Hypertensive chronic kidney disease with stage 1 through stage 4 chronic kidney disease, or unspecified chronic kidney disease: Secondary | ICD-10-CM | POA: Diagnosis not present

## 2013-12-15 DIAGNOSIS — E1149 Type 2 diabetes mellitus with other diabetic neurological complication: Secondary | ICD-10-CM | POA: Diagnosis not present

## 2013-12-15 DIAGNOSIS — N200 Calculus of kidney: Secondary | ICD-10-CM | POA: Diagnosis not present

## 2013-12-15 DIAGNOSIS — N183 Chronic kidney disease, stage 3 unspecified: Secondary | ICD-10-CM | POA: Diagnosis not present

## 2013-12-15 DIAGNOSIS — Z79899 Other long term (current) drug therapy: Secondary | ICD-10-CM | POA: Diagnosis not present

## 2013-12-15 DIAGNOSIS — E1142 Type 2 diabetes mellitus with diabetic polyneuropathy: Secondary | ICD-10-CM | POA: Diagnosis not present

## 2013-12-15 NOTE — Progress Notes (Signed)
NPO AFTER MN. ARRIVE AT 1100. PT COMING ON Monday 12-18-2013 FOR BMET AND URINE CULTURE. CURRENT CBC RESULT AND EKG IN CHART AND EPIC. WILL TAKE WELLBUTRIN, SYNTHROID, PRILOSEC AND IF NEEDED TAKE PAIN MED. W/ SIPS OF WATER AM DOS.

## 2013-12-18 DIAGNOSIS — F3289 Other specified depressive episodes: Secondary | ICD-10-CM | POA: Diagnosis not present

## 2013-12-18 DIAGNOSIS — N201 Calculus of ureter: Secondary | ICD-10-CM | POA: Diagnosis not present

## 2013-12-18 DIAGNOSIS — G4730 Sleep apnea, unspecified: Secondary | ICD-10-CM | POA: Diagnosis not present

## 2013-12-18 DIAGNOSIS — Z9884 Bariatric surgery status: Secondary | ICD-10-CM | POA: Diagnosis not present

## 2013-12-18 DIAGNOSIS — K219 Gastro-esophageal reflux disease without esophagitis: Secondary | ICD-10-CM | POA: Diagnosis not present

## 2013-12-18 DIAGNOSIS — N1330 Unspecified hydronephrosis: Secondary | ICD-10-CM | POA: Diagnosis not present

## 2013-12-18 DIAGNOSIS — E039 Hypothyroidism, unspecified: Secondary | ICD-10-CM | POA: Diagnosis not present

## 2013-12-18 DIAGNOSIS — E785 Hyperlipidemia, unspecified: Secondary | ICD-10-CM | POA: Diagnosis not present

## 2013-12-18 DIAGNOSIS — I10 Essential (primary) hypertension: Secondary | ICD-10-CM | POA: Diagnosis not present

## 2013-12-18 DIAGNOSIS — Z8673 Personal history of transient ischemic attack (TIA), and cerebral infarction without residual deficits: Secondary | ICD-10-CM | POA: Diagnosis not present

## 2013-12-18 DIAGNOSIS — F329 Major depressive disorder, single episode, unspecified: Secondary | ICD-10-CM | POA: Diagnosis not present

## 2013-12-18 DIAGNOSIS — E119 Type 2 diabetes mellitus without complications: Secondary | ICD-10-CM | POA: Diagnosis not present

## 2013-12-18 DIAGNOSIS — Z96659 Presence of unspecified artificial knee joint: Secondary | ICD-10-CM | POA: Diagnosis not present

## 2013-12-18 DIAGNOSIS — F17200 Nicotine dependence, unspecified, uncomplicated: Secondary | ICD-10-CM | POA: Diagnosis not present

## 2013-12-18 DIAGNOSIS — Z90710 Acquired absence of both cervix and uterus: Secondary | ICD-10-CM | POA: Diagnosis not present

## 2013-12-18 DIAGNOSIS — D649 Anemia, unspecified: Secondary | ICD-10-CM | POA: Diagnosis not present

## 2013-12-18 DIAGNOSIS — N390 Urinary tract infection, site not specified: Secondary | ICD-10-CM | POA: Diagnosis not present

## 2013-12-18 DIAGNOSIS — Z79899 Other long term (current) drug therapy: Secondary | ICD-10-CM | POA: Diagnosis not present

## 2013-12-18 LAB — BASIC METABOLIC PANEL
Anion gap: 16 — ABNORMAL HIGH (ref 5–15)
BUN: 14 mg/dL (ref 6–23)
CO2: 24 meq/L (ref 19–32)
Calcium: 9.4 mg/dL (ref 8.4–10.5)
Chloride: 98 mEq/L (ref 96–112)
Creatinine, Ser: 0.83 mg/dL (ref 0.50–1.10)
GFR calc Af Amer: 82 mL/min — ABNORMAL LOW (ref >90)
GFR, EST NON AFRICAN AMERICAN: 71 mL/min — AB (ref >90)
GLUCOSE: 133 mg/dL — AB (ref 70–99)
POTASSIUM: 3.5 meq/L — AB (ref 3.7–5.3)
Sodium: 138 mEq/L (ref 137–147)

## 2013-12-19 DIAGNOSIS — T84099A Other mechanical complication of unspecified internal joint prosthesis, initial encounter: Secondary | ICD-10-CM | POA: Diagnosis not present

## 2013-12-20 ENCOUNTER — Ambulatory Visit (HOSPITAL_BASED_OUTPATIENT_CLINIC_OR_DEPARTMENT_OTHER)
Admission: RE | Admit: 2013-12-20 | Discharge: 2013-12-20 | Disposition: A | Discharge status: Home / Self Care | Payer: Medicare Other | Source: Ambulatory Visit | Attending: Urology | Admitting: Urology

## 2013-12-20 ENCOUNTER — Encounter (HOSPITAL_BASED_OUTPATIENT_CLINIC_OR_DEPARTMENT_OTHER): Payer: Medicare Other | Admitting: Certified Registered"

## 2013-12-20 ENCOUNTER — Encounter (HOSPITAL_BASED_OUTPATIENT_CLINIC_OR_DEPARTMENT_OTHER): Payer: Self-pay | Admitting: Urology

## 2013-12-20 ENCOUNTER — Ambulatory Visit (HOSPITAL_BASED_OUTPATIENT_CLINIC_OR_DEPARTMENT_OTHER): Payer: Medicare Other | Admitting: Certified Registered"

## 2013-12-20 ENCOUNTER — Encounter (HOSPITAL_BASED_OUTPATIENT_CLINIC_OR_DEPARTMENT_OTHER)
Admission: RE | Disposition: A | Discharge status: Home / Self Care | Payer: Self-pay | Source: Ambulatory Visit | Attending: Urology

## 2013-12-20 DIAGNOSIS — E119 Type 2 diabetes mellitus without complications: Secondary | ICD-10-CM | POA: Diagnosis not present

## 2013-12-20 DIAGNOSIS — N201 Calculus of ureter: Secondary | ICD-10-CM | POA: Diagnosis not present

## 2013-12-20 DIAGNOSIS — N1330 Unspecified hydronephrosis: Secondary | ICD-10-CM | POA: Diagnosis not present

## 2013-12-20 DIAGNOSIS — Z90710 Acquired absence of both cervix and uterus: Secondary | ICD-10-CM | POA: Insufficient documentation

## 2013-12-20 DIAGNOSIS — I10 Essential (primary) hypertension: Secondary | ICD-10-CM | POA: Insufficient documentation

## 2013-12-20 DIAGNOSIS — F3289 Other specified depressive episodes: Secondary | ICD-10-CM | POA: Insufficient documentation

## 2013-12-20 DIAGNOSIS — F32A Depression, unspecified: Secondary | ICD-10-CM | POA: Insufficient documentation

## 2013-12-20 DIAGNOSIS — M8580 Other specified disorders of bone density and structure, unspecified site: Secondary | ICD-10-CM | POA: Insufficient documentation

## 2013-12-20 DIAGNOSIS — N390 Urinary tract infection, site not specified: Secondary | ICD-10-CM | POA: Insufficient documentation

## 2013-12-20 DIAGNOSIS — N200 Calculus of kidney: Secondary | ICD-10-CM

## 2013-12-20 DIAGNOSIS — E039 Hypothyroidism, unspecified: Secondary | ICD-10-CM | POA: Insufficient documentation

## 2013-12-20 DIAGNOSIS — G4730 Sleep apnea, unspecified: Secondary | ICD-10-CM | POA: Insufficient documentation

## 2013-12-20 DIAGNOSIS — K8020 Calculus of gallbladder without cholecystitis without obstruction: Secondary | ICD-10-CM | POA: Insufficient documentation

## 2013-12-20 DIAGNOSIS — E785 Hyperlipidemia, unspecified: Secondary | ICD-10-CM | POA: Insufficient documentation

## 2013-12-20 DIAGNOSIS — Z96659 Presence of unspecified artificial knee joint: Secondary | ICD-10-CM | POA: Insufficient documentation

## 2013-12-20 DIAGNOSIS — Z79899 Other long term (current) drug therapy: Secondary | ICD-10-CM | POA: Insufficient documentation

## 2013-12-20 DIAGNOSIS — Z8673 Personal history of transient ischemic attack (TIA), and cerebral infarction without residual deficits: Secondary | ICD-10-CM | POA: Insufficient documentation

## 2013-12-20 DIAGNOSIS — F17200 Nicotine dependence, unspecified, uncomplicated: Secondary | ICD-10-CM | POA: Insufficient documentation

## 2013-12-20 DIAGNOSIS — D649 Anemia, unspecified: Secondary | ICD-10-CM | POA: Insufficient documentation

## 2013-12-20 DIAGNOSIS — K219 Gastro-esophageal reflux disease without esophagitis: Secondary | ICD-10-CM | POA: Insufficient documentation

## 2013-12-20 DIAGNOSIS — Z9884 Bariatric surgery status: Secondary | ICD-10-CM | POA: Insufficient documentation

## 2013-12-20 DIAGNOSIS — F329 Major depressive disorder, single episode, unspecified: Secondary | ICD-10-CM | POA: Insufficient documentation

## 2013-12-20 HISTORY — DX: Presence of spectacles and contact lenses: Z97.3

## 2013-12-20 HISTORY — PX: HOLMIUM LASER APPLICATION: SHX5852

## 2013-12-20 HISTORY — PX: CYSTOSCOPY WITH URETEROSCOPY AND STENT PLACEMENT: SHX6377

## 2013-12-20 HISTORY — DX: Personal history of transient ischemic attack (TIA), and cerebral infarction without residual deficits: Z86.73

## 2013-12-20 HISTORY — DX: Type 2 diabetes mellitus without complications: E11.9

## 2013-12-20 HISTORY — DX: Cardiac murmur, unspecified: R01.1

## 2013-12-20 HISTORY — DX: Calculus of ureter: N20.1

## 2013-12-20 HISTORY — DX: Personal history of urinary calculi: Z87.442

## 2013-12-20 HISTORY — DX: Urgency of urination: R39.15

## 2013-12-20 LAB — GLUCOSE, CAPILLARY: GLUCOSE-CAPILLARY: 112 mg/dL — AB (ref 70–99)

## 2013-12-20 LAB — POCT I-STAT, CHEM 8
BUN: 15 mg/dL (ref 6–23)
CREATININE: 0.9 mg/dL (ref 0.50–1.10)
Calcium, Ion: 1.21 mmol/L (ref 1.13–1.30)
Chloride: 102 mEq/L (ref 96–112)
GLUCOSE: 106 mg/dL — AB (ref 70–99)
HCT: 40 % (ref 36.0–46.0)
Hemoglobin: 13.6 g/dL (ref 12.0–15.0)
Potassium: 3.1 mEq/L — ABNORMAL LOW (ref 3.7–5.3)
Sodium: 142 mEq/L (ref 137–147)
TCO2: 24 mmol/L (ref 0–100)

## 2013-12-20 SURGERY — CYSTOURETEROSCOPY, WITH STENT INSERTION
Anesthesia: General | Site: Ureter | Laterality: Bilateral

## 2013-12-20 MED ORDER — BELLADONNA ALKALOIDS-OPIUM 16.2-60 MG RE SUPP
RECTAL | Status: DC | PRN
  Administered 2013-12-20: 1 via RECTAL

## 2013-12-20 MED ORDER — SODIUM CHLORIDE 0.9 % IR SOLN
Status: DC | PRN
  Administered 2013-12-20: 4000 mL

## 2013-12-20 MED ORDER — LACTATED RINGERS IV SOLN
INTRAVENOUS | Status: DC
  Filled 2013-12-20: qty 1000

## 2013-12-20 MED ORDER — HYDROMORPHONE HCL PF 1 MG/ML IJ SOLN
0.2500 mg | INTRAMUSCULAR | Status: DC | PRN
  Filled 2013-12-20: qty 1

## 2013-12-20 MED ORDER — KETOROLAC TROMETHAMINE 30 MG/ML IJ SOLN
INTRAMUSCULAR | Status: DC | PRN
  Administered 2013-12-20: 30 mg via INTRAVENOUS

## 2013-12-20 MED ORDER — BELLADONNA ALKALOIDS-OPIUM 16.2-60 MG RE SUPP
RECTAL | Status: AC
  Filled 2013-12-20: qty 1

## 2013-12-20 MED ORDER — IOHEXOL 350 MG/ML SOLN
INTRAVENOUS | Status: DC | PRN
  Administered 2013-12-20: 14 mL

## 2013-12-20 MED ORDER — PHENAZOPYRIDINE HCL 200 MG PO TABS: 200.0000 mg | ORAL_TABLET | Freq: Three times a day (TID) | ORAL | Status: DC | PRN

## 2013-12-20 MED ORDER — GENTAMICIN SULFATE 40 MG/ML IJ SOLN
210.0000 mg | INTRAMUSCULAR | Status: DC
  Filled 2013-12-20: qty 5.25

## 2013-12-20 MED ORDER — LIDOCAINE HCL (CARDIAC) 20 MG/ML IV SOLN
INTRAVENOUS | Status: DC | PRN
  Administered 2013-12-20: 60 mg via INTRAVENOUS

## 2013-12-20 MED ORDER — PROPOFOL 10 MG/ML IV BOLUS
INTRAVENOUS | Status: DC | PRN
  Administered 2013-12-20: 160 mg via INTRAVENOUS

## 2013-12-20 MED ORDER — LIDOCAINE HCL 2 % EX GEL
CUTANEOUS | Status: DC | PRN
  Administered 2013-12-20: 1 via URETHRAL

## 2013-12-20 MED ORDER — MIDAZOLAM HCL 2 MG/2ML IJ SOLN
INTRAMUSCULAR | Status: AC
  Filled 2013-12-20: qty 2

## 2013-12-20 MED ORDER — PROMETHAZINE HCL 25 MG/ML IJ SOLN
6.2500 mg | INTRAMUSCULAR | Status: DC | PRN
  Filled 2013-12-20: qty 1

## 2013-12-20 MED ORDER — LACTATED RINGERS IV SOLN
INTRAVENOUS | Status: DC
  Administered 2013-12-20 (×2): via INTRAVENOUS
  Filled 2013-12-20: qty 1000

## 2013-12-20 MED ORDER — ONDANSETRON HCL 4 MG/2ML IJ SOLN
INTRAMUSCULAR | Status: DC | PRN
  Administered 2013-12-20: 4 mg via INTRAVENOUS

## 2013-12-20 MED ORDER — FENTANYL CITRATE 0.05 MG/ML IJ SOLN
INTRAMUSCULAR | Status: DC | PRN
  Administered 2013-12-20: 50 ug via INTRAVENOUS
  Administered 2013-12-20 (×2): 25 ug via INTRAVENOUS

## 2013-12-20 MED ORDER — MIDAZOLAM HCL 5 MG/5ML IJ SOLN
INTRAMUSCULAR | Status: DC | PRN
  Administered 2013-12-20 (×2): 1 mg via INTRAVENOUS

## 2013-12-20 MED ORDER — ACETAMINOPHEN-CODEINE #3 300-30 MG PO TABS: 1.0000 | ORAL_TABLET | ORAL | Status: DC | PRN

## 2013-12-20 MED ORDER — DEXAMETHASONE SODIUM PHOSPHATE 4 MG/ML IJ SOLN
INTRAMUSCULAR | Status: DC | PRN
  Administered 2013-12-20: 8 mg via INTRAVENOUS

## 2013-12-20 MED ORDER — FENTANYL CITRATE 0.05 MG/ML IJ SOLN
INTRAMUSCULAR | Status: AC
  Filled 2013-12-20: qty 6

## 2013-12-20 MED ORDER — GENTAMICIN SULFATE 40 MG/ML IJ SOLN
400.0000 mg | Freq: Once | INTRAMUSCULAR | Status: AC
  Administered 2013-12-20: 400 mg via INTRAVENOUS
  Filled 2013-12-20: qty 10

## 2013-12-20 MED ORDER — CIPROFLOXACIN IN D5W 400 MG/200ML IV SOLN
400.0000 mg | INTRAVENOUS | Status: AC
  Administered 2013-12-20: 400 mg via INTRAVENOUS
  Filled 2013-12-20: qty 200

## 2013-12-20 SURGICAL SUPPLY — 39 items
BAG DRAIN URO-CYSTO SKYTR STRL (DRAIN) ×3 IMPLANT
BAG DRN UROCATH (DRAIN) ×1
BASKET LASER NITINOL 1.9FR (BASKET) IMPLANT
BASKET STNLS GEMINI 4WIRE 3FR (BASKET) IMPLANT
BASKET STONE 1.7 NGAGE (UROLOGICAL SUPPLIES) ×2 IMPLANT
BASKET ZERO TIP NITINOL 2.4FR (BASKET) IMPLANT
BSKT STON RTRVL 120 1.9FR (BASKET)
BSKT STON RTRVL GEM 120X11 3FR (BASKET)
BSKT STON RTRVL ZERO TP 2.4FR (BASKET)
CANISTER SUCT LVC 12 LTR MEDI- (MISCELLANEOUS) ×3 IMPLANT
CATH URET 5FR 28IN OPEN ENDED (CATHETERS) ×3 IMPLANT
CATH URET DUAL LUMEN 6-10FR 50 (CATHETERS) IMPLANT
CLOTH BEACON ORANGE TIMEOUT ST (SAFETY) ×3 IMPLANT
DRAPE CAMERA CLOSED 9X96 (DRAPES) ×3 IMPLANT
FIBER LASER FLEXIVA 200 (UROLOGICAL SUPPLIES) IMPLANT
FIBER LASER FLEXIVA 365 (UROLOGICAL SUPPLIES) ×2 IMPLANT
GLOVE BIO SURGEON STRL SZ7.5 (GLOVE) ×3 IMPLANT
GLOVE BIOGEL M 6.5 STRL (GLOVE) ×2 IMPLANT
GLOVE BIOGEL PI IND STRL 6.5 (GLOVE) IMPLANT
GLOVE BIOGEL PI INDICATOR 6.5 (GLOVE) ×4
GOWN STRL REIN XL XLG (GOWN DISPOSABLE) ×1 IMPLANT
GOWN STRL REUS W/TWL LRG LVL3 (GOWN DISPOSABLE) ×2 IMPLANT
GOWN STRL REUS W/TWL XL LVL3 (GOWN DISPOSABLE) ×2 IMPLANT
GUIDEWIRE 0.038 PTFE COATED (WIRE) IMPLANT
GUIDEWIRE ANG ZIPWIRE 038X150 (WIRE) IMPLANT
GUIDEWIRE STR DUAL SENSOR (WIRE) ×5 IMPLANT
IV NS 1000ML (IV SOLUTION) ×3
IV NS 1000ML BAXH (IV SOLUTION) IMPLANT
IV NS IRRIG 3000ML ARTHROMATIC (IV SOLUTION) ×4 IMPLANT
KIT BALLIN UROMAX 15FX10 (LABEL) IMPLANT
KIT BALLN UROMAX 15FX4 (MISCELLANEOUS) IMPLANT
KIT BALLN UROMAX 26 75X4 (MISCELLANEOUS)
NS IRRIG 500ML POUR BTL (IV SOLUTION) IMPLANT
PACK CYSTOSCOPY (CUSTOM PROCEDURE TRAY) ×3 IMPLANT
SET HIGH PRES BAL DIL (LABEL)
SHEATH ACCESS URETERAL 38CM (SHEATH) IMPLANT
SHEATH ACCESS URETERAL 54CM (SHEATH) IMPLANT
STENT URET 6FRX26 CONTOUR (STENTS) ×4 IMPLANT
TUBE FEEDING 8FR 16IN STR KANG (MISCELLANEOUS) IMPLANT

## 2013-12-20 NOTE — Discharge Instructions (Signed)
DISCHARGE INSTRUCTIONS FOR KIDNEY STONE/URETERAL STENT   MEDICATIONS:  1.  Resume all your other meds from home - except do not take any extra narcotic pain meds that you may have at home.  2. Pyridium is to help with the burning/stinging when you urinate. 3. Tylenol with codeine is for moderate/severe pain, otherwise taking upto 1000mg  every 6 hours of plainTylenol will help treat your pain.  Do not take both at the same time.  ACTIVITY:  1. No strenuous activity x 1week  2. No driving while on narcotic pain medications  3. Drink plenty of water  4. Continue to walk at home - you can still get blood clots when you are at home, so keep active, but don't over do it.  5. May return to work/school tomorrow or when you feel ready   BATHING:  1. You can shower and we recommend daily showers  2. You have a string coming from your urethra: The stent string is attached to your ureteral stent. Do not pull on this.   SIGNS/SYMPTOMS TO CALL:  Please call us if you have a fever greater than 101.5, uncontrolled nausea/vomiting, uncontrolled pain, dizziness, unable to urinate, bloody urine, chest pain, shortness of breath, leg swelling, leg pain, redness around wound, drainage from wound, or any other concerns or questions.   You can reach [place] at [phone].   FOLLOW-UP:  1. You have an appointment for stent removal in 1 week.   Post Anesthesia Home Care Instructions  Activity: Get plenty of rest for the remainder of the day. A responsible adult should stay with you for 24 hours following the procedure.  For the next 24 hours, DO NOT: -Drive a car -Paediatric nurse -Drink alcoholic beverages -Take any medication unless instructed by your physician -Make any legal decisions or sign important papers.  Meals: Start with liquid foods such as gelatin or soup. Progress to regular foods as tolerated. Avoid greasy, spicy, heavy foods. If nausea and/or vomiting occur, drink only clear liquids until  the nausea and/or vomiting subsides. Call your physician if vomiting continues.  Special Instructions/Symptoms: Your throat may feel dry or sore from the anesthesia or the breathing tube placed in your throat during surgery. If this causes discomfort, gargle with warm salt water. The discomfort should disappear within 24 hours.

## 2013-12-20 NOTE — Transfer of Care (Signed)
Immediate Anesthesia Transfer of Care Note  Patient: Sharon Lowe  Procedure(s) Performed: Procedure(s) (LRB): CYSTOSCOPY WITH BILATERAL URETEROSCOPY, LASER LITHOTRIPSY AND STENT EXCHANGE (Bilateral) HOLMIUM LASER APPLICATION (Bilateral)  Patient Location: PACU  Anesthesia Type: General  Level of Consciousness: awake, oriented, sedated and patient cooperative  Airway & Oxygen Therapy: Patient Spontanous Breathing and Patient connected to face mask oxygen  Post-op Assessment: Report given to PACU RN and Post -op Vital signs reviewed and stable  Post vital signs: Reviewed and stable  Complications: No apparent anesthesia complications

## 2013-12-20 NOTE — Anesthesia Procedure Notes (Signed)
Procedure Name: LMA Insertion Date/Time: 12/20/2013 12:28 PM Performed by: Denna Haggard D Pre-anesthesia Checklist: Patient identified, Emergency Drugs available, Suction available and Patient being monitored Patient Re-evaluated:Patient Re-evaluated prior to inductionOxygen Delivery Method: Circle System Utilized Preoxygenation: Pre-oxygenation with 100% oxygen Intubation Type: IV induction Ventilation: Mask ventilation without difficulty LMA: LMA inserted LMA Size: 4.0 Number of attempts: 1 Airway Equipment and Method: bite block Placement Confirmation: positive ETCO2 Tube secured with: Tape Dental Injury: Teeth and Oropharynx as per pre-operative assessment

## 2013-12-20 NOTE — Interval H&P Note (Signed)
History and Physical Interval Note: Patient has had bilateral ureteral stents placed on 12/06/13 and has remained afebrile.  Her creatinine has trending back to normal.  She has had no changes to the above.  Okay to proceed with bilateral ureteroscopy. 12/20/2013 5:40 AM  Sharon Lowe  has presented today for surgery, with the diagnosis of bilateral ureteral stones  The various methods of treatment have been discussed with the patient and family. After consideration of risks, benefits and other options for treatment, the patient has consented to  Procedure(s): CYSTOSCOPY WITH BILATERAL URETEROSCOPY, LASER LITHOTRIPSY AND STENT EXCHANGE (Bilateral) as a surgical intervention .  The patient's history has been reviewed, patient examined, no change in status, stable for surgery.  I have reviewed the patient's chart and labs.  Questions were answered to the patient's satisfaction.     Louis Meckel W

## 2013-12-20 NOTE — H&P (View-Only) (Signed)
I have been asked to see the patient by Dr. Mirian Mo, for evaluation and management of bilateral obstructing ureteral stones.  History of present illness: This is a 68 year old female who presented to the emergency department with 5 hours of acute flank pain, right greater than left.  She denies any associated fevers or chills.  She denies any changes to her voiding symptoms including dysuria or gross hematuria.The patient has had severe nausea and associated emesis.  In the emergency department the patient was found to have bilateral distal ureteral stones with proximal hydronephrosis.  The right kidney has associated forniceal rupture. In addition, the patient was found to have a urine analysis suspicious for infection.  The patient has a history of urinary tract infections in the past, she has been treated several times over the last several months.  In the ER she was given a shot of Rocephin and a urine culture was sent.    Review of systems: A 12 point comprehensive review of systems was obtained and is negative unless otherwise stated in the history of present illness.  Patient Active Problem List   Diagnosis Date Noted  . UTI (lower urinary tract infection) 12/05/2013  . Expected blood loss anemia 10/31/2013  . Overweight (BMI 25.0-29.9) 10/31/2013  . Hyponatremia 10/31/2013  . S/P left knee revision 10/30/2013  . Chest pain 10/22/2013  . Tobacco abuse 10/22/2013  . Other and unspecified hyperlipidemia 10/22/2013  . Leukocytosis, unspecified 10/22/2013  . 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 DM (diabetes mellitus) for 20 years ID 07/24/2011  . Hypertension 07/24/2011    No current facility-administered medications on file prior to encounter.   Current Outpatient Prescriptions on File Prior to Encounter  Medication Sig Dispense Refill  . atorvastatin (LIPITOR) 10 MG tablet Take 10 mg by mouth every evening.      Marland Kitchen buPROPion (WELLBUTRIN SR) 100 MG 12 hr tablet Take  100 mg by mouth every morning.      . Cyanocobalamin (VITAMIN B-12) 2500 MCG SUBL Place 2,500 mcg under the tongue every morning.       . hydrochlorothiazide (MICROZIDE) 12.5 MG capsule Take 12.5 mg by mouth every morning.      Marland Kitchen levothyroxine (SYNTHROID, LEVOTHROID) 88 MCG tablet Take 88 mcg by mouth daily before breakfast.       . lisinopril (PRINIVIL,ZESTRIL) 10 MG tablet Take 10 mg by mouth every morning.       . loratadine (ALLERGY) 10 MG tablet Take 10 mg by mouth every morning.       . metFORMIN (GLUCOPHAGE) 500 MG tablet Take 500 mg by mouth 2 (two) times daily with a meal.      . Multiple Vitamin (MULTIVITAMIN WITH MINERALS) TABS tablet Take 2 tablets by mouth every morning.       . Omega-3 Fatty Acids (OMEGA-3 FISH OIL) 1200 MG CAPS Take 1 capsule by mouth every morning.      . Soft Lens Products (VISINE FOR CONTACTS) SOLN Place 2 drops into both eyes 3 (three) times daily as needed (dry eyes).         Past Medical History  Diagnosis Date  . Diabetes mellitus   . Hypertension   . Hyperlipidemia   . Substance abuse   . Thyroid disease   . GERD (gastroesophageal reflux disease)   . Chest pain 10/21/13   . Stroke     mini strokes- 2000  . Sleep apnea     could not handle CPAP-  2012   . Hypothyroidism   . Depression   . Urinary tract infection 10/20/2013   . Kidney stones   . Arthritis     Past Surgical History  Procedure Laterality Date  . Abdominal hysterectomy  1995  . Replacement unicondylar joint knee  2004    left  . Rotator cuff repair  2004    left  . Gastric bypass  08/18/10  . Knee arthroscopy      right  . Left knee surgery   2006     repair of broken knee cap   . Massive abdominal infection surgery   1977  . Total knee revision with scar debridement/patella revision with poly exchange Left 10/30/2013    Procedure: LEFT  TOTAL  KNEE  REVISION;  Surgeon: Mauri Pole, MD;  Location: [facility];  Service: Orthopedics;  Laterality: Left;    History  Substance  Use Topics  . Smoking status: Current Every Day Smoker -- 0.50 packs/day for 4 years    Types: Cigarettes  . Smokeless tobacco: Never Used  . Alcohol Use: Yes     Comment: rare     Family History  Problem Relation Age of Onset  . Diabetes Mother   . Hypertension Mother   . Hyperlipidemia Mother   . Heart disease Father   . Cancer Maternal Uncle     prostate, colon  . Cancer Paternal Grandmother     unaware of what kind    PE: Filed Vitals:   12/05/13 0934 12/05/13 1000 12/05/13 1022 12/05/13 1041  BP: 181/78 171/94  192/77  Pulse: 78 92  100  Temp:   99.9 F (37.7 C) 101 F (38.3 C)  TempSrc:   Oral Oral  Resp: 18   18  Weight:    83.5 kg (184 lb 1.4 oz)  SpO2: 99% 96%  97%   Patient appears to be in no acute distress  patient is alert and oriented x3 Atraumatic normocephalic head No cervical or supraclavicular lymphadenopathy appreciated No increased work of breathing, no audible wheezes/rhonchi Regular sinus rhythm/rate, the patient has a systolic ejection murmur Abdomen is soft, mild tenderness diffusely, nondistended, significant right-sided CVA tenderness, no left-sided tenderness. Lower extremities are symmetric without appreciable edema Grossly neurologically intact No identifiable skin lesions   Recent Labs  12/05/13 0822  WBC 19.4*  HGB 12.4  HCT 37.1    Recent Labs  12/05/13 0822  NA 137  K 5.2  CL 101  CO2 20  GLUCOSE 234*  BUN 28*  CREATININE 1.18*  CALCIUM 9.5   No results found for this basename: LABPT, INR,  in the last 72 hours No results found for this basename: LABURIN,  in the last 72 hours Results for orders placed during the hospital encounter of 10/24/13  SURGICAL PCR SCREEN     Status: None   Collection Time    10/24/13 12:33 PM      Result Value Ref Range Status   MRSA, PCR NEGATIVE  NEGATIVE Final   Staphylococcus aureus NEGATIVE  NEGATIVE Final   Comment:            The Xpert SA Assay (FDA     approved for NASAL  specimens     in patients over 80 years of age),     is one component of     a comprehensive surveillance     program.  Test performance has     been validated by [facility] for patients greater     than or equal to 26 year old.     It is not intended     to diagnose infection nor to     guide or monitor treatment.    Imaging: CT scan, abdomen and pelvis, stone protocol: IMPRESSION:  1. There is a 6.5 mm distal right ureteral calculus resulting in  severe right hydroureteronephrosis and severe right perinephric  stranding likely related to fornix rupture.  2. There is an 8 mm distal left ureteral calculus just proximal to  the UVJ resulting in severe left hydroureteronephrosis.  3. Cholelithiasis.  Imp: the patient has bilateral obstructing stones with elevated white blood cell count, poorly controlled pain, and evidence of a urinary tract infection  Recommendations: After reviewing the patient's management options with her I recommended that we proceed to the operating room on an urgent basis to place bilateral stents.  This will relieve the obstruction and help with her pain is well as her infection.  The patient is being admitted by Hospital medicine, I will leave her other medical issues to them for further management.  I discussed the procedure bilateral ureteral stent placement with the patient detail, including the risk and benefits.  She understands that she will need an additional procedure to take care of her stones at a later date once her infection clears.  She also understands that she will have some stent discomfort following the operation.  After answering all the patient's questions he agreed to proceed with the operation. Louis Meckel W

## 2013-12-20 NOTE — Anesthesia Preprocedure Evaluation (Signed)
Anesthesia Evaluation  Patient identified by MRN, date of birth, ID band Patient awake    Reviewed: Allergy & Precautions, H&P , NPO status , Patient's Chart, lab work & pertinent test results  Airway Mallampati: II TM Distance: >3 FB Neck ROM: Full    Dental no notable dental hx. (+) Caps, Dental Advisory Given,    Pulmonary sleep apnea , Current Smoker,  breath sounds clear to auscultation  Pulmonary exam normal       Cardiovascular hypertension, Pt. on medications Rhythm:Regular Rate:Normal     Neuro/Psych PSYCHIATRIC DISORDERS Depression CVA    GI/Hepatic Neg liver ROS, GERD-  Medicated,  Endo/Other  diabetes, Type 2, Oral Hypoglycemic AgentsHypothyroidism   Renal/GU Renal diseaseK 5.2, Cr 1.18  negative genitourinary   Musculoskeletal negative musculoskeletal ROS (+)   Abdominal   Peds negative pediatric ROS (+)  Hematology  (+) anemia ,   Anesthesia Other Findings   Reproductive/Obstetrics negative OB ROS                           Anesthesia Physical Anesthesia Plan  ASA: III  Anesthesia Plan: General   Post-op Pain Management:    Induction: Intravenous  Airway Management Planned: LMA  Additional Equipment:   Intra-op Plan:   Post-operative Plan: Extubation in OR  Informed Consent: I have reviewed the patients History and Physical, chart, labs and discussed the procedure including the risks, benefits and alternatives for the proposed anesthesia with the patient or authorized representative who has indicated his/her understanding and acceptance.   Dental advisory given  Plan Discussed with: CRNA  Anesthesia Plan Comments:         Anesthesia Quick Evaluation

## 2013-12-20 NOTE — Op Note (Signed)
Preoperative diagnosis:  1. Bilateral obstructing stones   Postoperative diagnosis:  1. same   Procedure: 1. Cystoscopy 2. Bilateral retrograde pyelogram with interpretation 3. Bilateral ureteroscopy laser lithotripsy stone extraction 4. Bilateral ureteral stent exchange  Surgeon: Ardis Hughs, MD  Anesthesia: General  Complications: None  Intraoperative findings: Retrograde pyelogram performed on the right revealed a proximal filling defect with a dilated ureter and a normal renal pelvis with nondilated calyces. The left retrograde pyelogram revealed a mid ureteral filling defect with a dilated ureter. The left upper collecting system was within normal limits. 26 cm x 6 French double-J ureteral stents were placed in both ureters at the end of the case.  EBL: Minimal  Specimens: Stones are taken to [facility] for further analysis  Indication: Sharon Lowe is a 68 y.o. patient who presented to the emergency department several weeks ago with bilateral obstructing stones. She was also noted to have a urinary tract infection and elevated white blood cell count. She was admitted to the hospital placed on broad-spectrum antibiotics and underwent bilateral ureteral stent placement. She presents today for stone removal.  After reviewing the management options for treatment, she elected to proceed with the above surgical procedure(s). We have discussed the potential benefits and risks of the procedure, side effects of the proposed treatment, the likelihood of the patient achieving the goals of the procedure, and any potential problems that might occur during the procedure or recuperation. Informed consent has been obtained.  Description of procedure:  The patient was taken to the operating room and general anesthesia was induced.  The patient was placed in the dorsal lithotomy position, prepped and draped in the usual sterile fashion, and preoperative antibiotics were administered. A  preoperative time-out was performed.   Cystoscope was then gently passed to the patient's bladder under visual guidance and attempted to 60 cystoscopic evaluation was performed with no new your additional findings. The stents were interlocking and the right stent was grasped and both stents pulled to the urethral meatus. A 0.38 sensor wire was then passed up the right stent, but there was obstruction within the stent and was unable to get the wire into the renal pelvis. I then turned a wire around to the stiff end of the wire was entered into the stent the distal end and advanced to the same obstructing area. I then repassed the cystoscope into the bladder and pass a 0.38 guidewire into the ureteral orifice and beyond the stent into the renal pelvis. I was unable to remove the stent without any additional problems. The scope was removed and I was able to pass a second 0.38 French sensor wire through the left ureteral stent and into the renal pelvis. I remove the stent over the wire. Using a semirigid ureteroscope then gently placed into the patient's bladder and into the left ureteral orifice under visual guidance. I encountered the stone at the distal ureter as expected by the retrograde pyelogram. I was unable to basket the stone given its size and thus used a 365 micron holmium laser fiber with settings of 0.6 J at 6 Hz and fragmented the stone into multiple smaller pieces. Then using a 1.7 [place] in gauge stone basket I was able to retrieve the fragments quite easily. These were passed into the patient's bladder. I then advanced the ureteroscope up into the left proximal ureter and found no additional stone fragments. I then turned my attention to the patient's right ureteral orifice and gently passed the ureteroscope  into the patient's right ureter. The stone was impacted and located in the mid ureter. Again I was unable to basket the stone and as such using the same laser fiber and setting the fragmented  the stone into multiple smaller pieces. These were then grasped with the in gauge basket and pulled from the ureter without any complications. Once the stones had been removed completely I was able to advance the scope into the proximal right ureter and found no additional stone fragments. There was still contrast within the right ureter and as such I advanced a 26 cm x 6 French double-J ureteral stent over the wire located in the right ureter and advance stent over the wire and into the right renal pelvis under fluoroscopic guidance. Once the end of the stent was visualized in the right renal pelvis this stent was advance to the urethral meatus and with gentle pressure the wire was removed keeping the stent at the urethral meatus which then ultimately popped into the bladder, confirmed by fluoroscopy. A 5 French open-ended ureteral Pollock catheter was then passed over the wire within the left ureter and the wire was removed. Contrast was injected into the left renal pelvis to facilitate stent placement. A 0.38 sensor wire was then advanced through the open-ended catheter and advanced into the left renal pelvis. A 26 cm x 6 French double-J ureteral stent was then passed over the wire and into the left ureter. Once it was noted to be at the left renal pelvis the stent was advanced to the urethral meatus and again holding gentle pressure on the stent the wire was removed and the stent retracted into the bladder. Cystoscopy was then again performed to ensure that there was a nice curl in the bladder. The stone fragments were also removed at this time. Once all stones were removed a B. and O. suppository was placed in the patient's rectum. A 10 cc syringe of 1% lidocaine jelly was then injected the patient's urethra. The patient was subsequently awoken and returned back in excellent condition. There no immediate complications.  Ardis Hughs, M.D.

## 2013-12-21 ENCOUNTER — Encounter (HOSPITAL_BASED_OUTPATIENT_CLINIC_OR_DEPARTMENT_OTHER): Payer: Self-pay | Admitting: Urology

## 2013-12-21 DIAGNOSIS — N200 Calculus of kidney: Secondary | ICD-10-CM | POA: Diagnosis not present

## 2013-12-21 LAB — URINE CULTURE: Colony Count: 60000

## 2013-12-21 NOTE — Anesthesia Postprocedure Evaluation (Signed)
  Anesthesia Post-op Note  Patient: Sharon Lowe  Procedure(s) Performed: Procedure(s) (LRB): CYSTOSCOPY WITH BILATERAL URETEROSCOPY, LASER LITHOTRIPSY AND STENT EXCHANGE (Bilateral) HOLMIUM LASER APPLICATION (Bilateral)  Patient Location: PACU  Anesthesia Type: General  Level of Consciousness: awake and alert   Airway and Oxygen Therapy: Patient Spontanous Breathing  Post-op Pain: mild  Post-op Assessment: Post-op Vital signs reviewed, Patient's Cardiovascular Status Stable, Respiratory Function Stable, Patent Airway and No signs of Nausea or vomiting  Last Vitals:  Filed Vitals:   12/20/13 1455  BP: 164/85  Pulse: 57  Temp: 36.4 C  Resp: 18    Post-op Vital Signs: stable   Complications: No apparent anesthesia complications

## 2013-12-26 DIAGNOSIS — N200 Calculus of kidney: Secondary | ICD-10-CM | POA: Diagnosis not present

## 2014-01-22 DIAGNOSIS — E119 Type 2 diabetes mellitus without complications: Secondary | ICD-10-CM | POA: Diagnosis not present

## 2014-02-06 DIAGNOSIS — N200 Calculus of kidney: Secondary | ICD-10-CM | POA: Diagnosis not present

## 2014-03-06 DIAGNOSIS — M1711 Unilateral primary osteoarthritis, right knee: Secondary | ICD-10-CM | POA: Diagnosis not present

## 2014-03-12 DIAGNOSIS — H2511 Age-related nuclear cataract, right eye: Secondary | ICD-10-CM | POA: Diagnosis not present

## 2014-03-12 DIAGNOSIS — H2512 Age-related nuclear cataract, left eye: Secondary | ICD-10-CM | POA: Diagnosis not present

## 2014-03-12 DIAGNOSIS — H02839 Dermatochalasis of unspecified eye, unspecified eyelid: Secondary | ICD-10-CM | POA: Diagnosis not present

## 2014-03-12 DIAGNOSIS — H18411 Arcus senilis, right eye: Secondary | ICD-10-CM | POA: Diagnosis not present

## 2014-03-13 DIAGNOSIS — M1711 Unilateral primary osteoarthritis, right knee: Secondary | ICD-10-CM | POA: Diagnosis not present

## 2014-03-20 DIAGNOSIS — M25561 Pain in right knee: Secondary | ICD-10-CM | POA: Diagnosis not present

## 2014-03-20 DIAGNOSIS — M1711 Unilateral primary osteoarthritis, right knee: Secondary | ICD-10-CM | POA: Diagnosis not present

## 2014-03-30 DIAGNOSIS — Z0000 Encounter for general adult medical examination without abnormal findings: Secondary | ICD-10-CM | POA: Diagnosis not present

## 2014-03-30 DIAGNOSIS — I10 Essential (primary) hypertension: Secondary | ICD-10-CM | POA: Diagnosis not present

## 2014-03-30 DIAGNOSIS — I340 Nonrheumatic mitral (valve) insufficiency: Secondary | ICD-10-CM | POA: Diagnosis not present

## 2014-03-30 DIAGNOSIS — Z1389 Encounter for screening for other disorder: Secondary | ICD-10-CM | POA: Diagnosis not present

## 2014-03-30 DIAGNOSIS — E1151 Type 2 diabetes mellitus with diabetic peripheral angiopathy without gangrene: Secondary | ICD-10-CM | POA: Diagnosis not present

## 2014-04-02 ENCOUNTER — Other Ambulatory Visit (HOSPITAL_COMMUNITY): Payer: Self-pay | Admitting: Geriatric Medicine

## 2014-04-02 ENCOUNTER — Ambulatory Visit (HOSPITAL_COMMUNITY): Payer: Medicare Other | Attending: Geriatric Medicine

## 2014-04-02 DIAGNOSIS — E785 Hyperlipidemia, unspecified: Secondary | ICD-10-CM | POA: Insufficient documentation

## 2014-04-02 DIAGNOSIS — E119 Type 2 diabetes mellitus without complications: Secondary | ICD-10-CM | POA: Insufficient documentation

## 2014-04-02 DIAGNOSIS — I10 Essential (primary) hypertension: Secondary | ICD-10-CM | POA: Insufficient documentation

## 2014-04-02 DIAGNOSIS — Z720 Tobacco use: Secondary | ICD-10-CM | POA: Insufficient documentation

## 2014-04-02 DIAGNOSIS — I340 Nonrheumatic mitral (valve) insufficiency: Secondary | ICD-10-CM | POA: Diagnosis not present

## 2014-04-02 NOTE — Progress Notes (Signed)
2D Echo completed. 04/02/2014  

## 2014-04-03 DIAGNOSIS — E1151 Type 2 diabetes mellitus with diabetic peripheral angiopathy without gangrene: Secondary | ICD-10-CM | POA: Diagnosis not present

## 2014-04-03 DIAGNOSIS — Z716 Tobacco abuse counseling: Secondary | ICD-10-CM | POA: Diagnosis not present

## 2014-04-03 DIAGNOSIS — E039 Hypothyroidism, unspecified: Secondary | ICD-10-CM | POA: Diagnosis not present

## 2014-04-03 DIAGNOSIS — E279 Disorder of adrenal gland, unspecified: Secondary | ICD-10-CM | POA: Diagnosis not present

## 2014-04-03 DIAGNOSIS — Z23 Encounter for immunization: Secondary | ICD-10-CM | POA: Diagnosis not present

## 2014-04-03 DIAGNOSIS — Z720 Tobacco use: Secondary | ICD-10-CM | POA: Diagnosis not present

## 2014-04-03 DIAGNOSIS — E1142 Type 2 diabetes mellitus with diabetic polyneuropathy: Secondary | ICD-10-CM | POA: Diagnosis not present

## 2014-04-03 DIAGNOSIS — F17210 Nicotine dependence, cigarettes, uncomplicated: Secondary | ICD-10-CM | POA: Diagnosis not present

## 2014-04-09 DIAGNOSIS — E278 Other specified disorders of adrenal gland: Secondary | ICD-10-CM | POA: Diagnosis not present

## 2014-04-09 DIAGNOSIS — D3500 Benign neoplasm of unspecified adrenal gland: Secondary | ICD-10-CM | POA: Diagnosis not present

## 2014-04-18 DIAGNOSIS — L820 Inflamed seborrheic keratosis: Secondary | ICD-10-CM | POA: Diagnosis not present

## 2014-04-19 DIAGNOSIS — D3500 Benign neoplasm of unspecified adrenal gland: Secondary | ICD-10-CM | POA: Diagnosis not present

## 2014-04-23 DIAGNOSIS — D3500 Benign neoplasm of unspecified adrenal gland: Secondary | ICD-10-CM | POA: Diagnosis not present

## 2014-04-30 DIAGNOSIS — D497 Neoplasm of unspecified behavior of endocrine glands and other parts of nervous system: Secondary | ICD-10-CM | POA: Diagnosis not present

## 2014-04-30 DIAGNOSIS — N281 Cyst of kidney, acquired: Secondary | ICD-10-CM | POA: Diagnosis not present

## 2014-04-30 DIAGNOSIS — K8020 Calculus of gallbladder without cholecystitis without obstruction: Secondary | ICD-10-CM | POA: Diagnosis not present

## 2014-05-02 DIAGNOSIS — H25011 Cortical age-related cataract, right eye: Secondary | ICD-10-CM | POA: Diagnosis not present

## 2014-05-02 DIAGNOSIS — H2512 Age-related nuclear cataract, left eye: Secondary | ICD-10-CM | POA: Diagnosis not present

## 2014-05-02 DIAGNOSIS — H2511 Age-related nuclear cataract, right eye: Secondary | ICD-10-CM | POA: Diagnosis not present

## 2014-05-08 DIAGNOSIS — D497 Neoplasm of unspecified behavior of endocrine glands and other parts of nervous system: Secondary | ICD-10-CM | POA: Diagnosis not present

## 2014-05-08 DIAGNOSIS — N200 Calculus of kidney: Secondary | ICD-10-CM | POA: Diagnosis not present

## 2014-05-08 DIAGNOSIS — N281 Cyst of kidney, acquired: Secondary | ICD-10-CM | POA: Diagnosis not present

## 2014-05-09 DIAGNOSIS — H2512 Age-related nuclear cataract, left eye: Secondary | ICD-10-CM | POA: Diagnosis not present

## 2014-05-09 DIAGNOSIS — H2511 Age-related nuclear cataract, right eye: Secondary | ICD-10-CM | POA: Diagnosis not present

## 2014-05-16 DIAGNOSIS — M25561 Pain in right knee: Secondary | ICD-10-CM | POA: Diagnosis not present

## 2014-05-16 DIAGNOSIS — M1711 Unilateral primary osteoarthritis, right knee: Secondary | ICD-10-CM | POA: Diagnosis not present

## 2014-05-26 ENCOUNTER — Encounter (HOSPITAL_COMMUNITY): Payer: Self-pay | Admitting: *Deleted

## 2014-05-26 ENCOUNTER — Emergency Department (INDEPENDENT_AMBULATORY_CARE_PROVIDER_SITE_OTHER)
Admission: EM | Admit: 2014-05-26 | Discharge: 2014-05-26 | Disposition: A | Discharge status: Home / Self Care | Payer: Medicare Other | Source: Home / Self Care | Attending: Family Medicine | Admitting: Family Medicine

## 2014-05-26 DIAGNOSIS — Z23 Encounter for immunization: Secondary | ICD-10-CM

## 2014-05-26 DIAGNOSIS — S61412A Laceration without foreign body of left hand, initial encounter: Secondary | ICD-10-CM

## 2014-05-26 MED ORDER — TETANUS-DIPHTH-ACELL PERTUSSIS 5-2.5-18.5 LF-MCG/0.5 IM SUSP
INTRAMUSCULAR | Status: AC
  Filled 2014-05-26: qty 0.5

## 2014-05-26 MED ORDER — BACITRACIN ZINC 500 UNIT/GM EX OINT
TOPICAL_OINTMENT | CUTANEOUS | Status: AC
  Filled 2014-05-26: qty 0.9

## 2014-05-26 MED ORDER — TETANUS-DIPHTH-ACELL PERTUSSIS 5-2.5-18.5 LF-MCG/0.5 IM SUSP
0.5000 mL | Freq: Once | INTRAMUSCULAR | Status: AC
  Administered 2014-05-26: 0.5 mL via INTRAMUSCULAR

## 2014-05-26 MED ORDER — CEPHALEXIN 500 MG PO CAPS: 500.0000 mg | ORAL_CAPSULE | Freq: Four times a day (QID) | ORAL | Status: DC

## 2014-05-26 MED ORDER — DOXYCYCLINE HYCLATE 100 MG PO CAPS: 100.0000 mg | ORAL_CAPSULE | Freq: Two times a day (BID) | ORAL | Status: DC

## 2014-05-26 NOTE — ED Provider Notes (Signed)
Sharon Lowe is a 68 y.o. female who presents to Urgent Care today for left hand laceration. Patient slipped and fell at home a week ago. A she fell she grabbed a hold of her coffee table and suffered a laceration of the ulnar aspect of her left hand. She is right-hand dominant. She's been applying Neosporin ointment which seems to help. Recently the wound became red and painful. She cannot recall her last tetanus vaccination. She feels well otherwise. No pain with hand motion.   Past Medical History  Diagnosis Date  . Hypertension   . Hyperlipidemia   . GERD (gastroesophageal reflux disease)   . Hypothyroidism   . Depression   . Arthritis   . History of TIA (transient ischemic attack)     2000-- no residual  . Moderate obstructive sleep apnea     per study 03-06-2010--  non-compliant cpap  . Type 2 diabetes mellitus   . Bilateral ureteral calculi   . History of kidney stones   . History of fractured rib     fell may 2015  . Urgency of urination   . Wears contact lenses   . Heart murmur     asymptomatic   Past Surgical History  Procedure Laterality Date  . Knee arthroscopy Right   . Total knee revision with scar debridement/patella revision with poly exchange Left 10/30/2013    Procedure: LEFT  TOTAL  KNEE  REVISION;  Surgeon: Mauri Pole, MD;  Location: [facility];  Service: Orthopedics;  Laterality: Left;  . Cystoscopy w/ ureteral stent placement Bilateral 12/05/2013    Procedure: CYSTOSCOPY WITH RETROGRADE PYELOGRAM/URETERAL STENT PLACEMENT;  Surgeon: Ardis Hughs, MD;  Location: [facility];  Service: Urology;  Laterality: Bilateral;  . Laparoscopic gastric bypass  08-18-2010    AND REPAIR VENTRAL HERNIA  . Shoulder open rotator cuff repair Left 07-12-2002  . Total knee arthroplasty Left 11-17-2002  . Revision total knee arthroplasty Left 01-05-2005  . Total abdominal hysterectomy w/ bilateral salpingoophorectomy  1995  . Exploratomy laparotomy for abd. abscess from migration  of iud  1977  . Tonsillectomy  CHILD  . Cystoscopy with ureteroscopy and stent placement Bilateral 12/20/2013    Procedure: CYSTOSCOPY WITH BILATERAL URETEROSCOPY, LASER LITHOTRIPSY AND STENT EXCHANGE;  Surgeon: Ardis Hughs, MD;  Location: [facility];  Service: Urology;  Laterality: Bilateral;  . Holmium laser application Bilateral 08/20/252    Procedure: HOLMIUM LASER APPLICATION;  Surgeon: Ardis Hughs, MD;  Location: [facility];  Service: Urology;  Laterality: Bilateral;   History  Substance Use Topics  . Smoking status: Current Every Day Smoker -- 0.75 packs/day for 30 years    Types: Cigarettes  . Smokeless tobacco: Never Used  . Alcohol Use: Yes     Comment: occasional   ROS as above Medications: Current Facility-Administered Medications  Medication Dose Route Frequency Provider Last Rate Last Dose  . Tdap (BOOSTRIX) injection 0.5 mL  0.5 mL Intramuscular Once Gregor Hams, MD       Current Outpatient Prescriptions  Medication Sig Dispense Refill  . ACETAMINOPHEN PO Take by mouth.    Marland Kitchen aspirin EC 81 MG tablet Take 81 mg by mouth daily.    Marland Kitchen atorvastatin (LIPITOR) 10 MG tablet Take 10 mg by mouth every evening.    Marland Kitchen buPROPion (WELLBUTRIN SR) 100 MG 12 hr tablet Take 100 mg by mouth every morning.    . Calcium Carb-Cholecalciferol (CALCIUM 600+D) 600-800 MG-UNIT TABS Take 1 tablet  by mouth 2 (two) times daily.    . Cyanocobalamin (VITAMIN B-12) 2500 MCG SUBL Place 2,500 mcg under the tongue every morning.     . hydrochlorothiazide (MICROZIDE) 12.5 MG capsule Take 12.5 mg by mouth every morning.    Marland Kitchen levothyroxine (SYNTHROID, LEVOTHROID) 88 MCG tablet Take 88 mcg by mouth daily before breakfast.     . lisinopril (PRINIVIL,ZESTRIL) 10 MG tablet Take 10 mg by mouth every morning.     . loratadine (ALLERGY) 10 MG tablet Take 10 mg by mouth every morning.     Marland Kitchen MAGNESIUM PO Take by mouth.    . metFORMIN (GLUCOPHAGE) 500 MG tablet Take 500  mg by mouth 2 (two) times daily with a meal.    . Multiple Vitamin (MULTIVITAMIN WITH MINERALS) TABS tablet Take 2 tablets by mouth every morning.     . Omega-3 Fatty Acids (OMEGA-3 FISH OIL) 1200 MG CAPS Take 1 capsule by mouth every morning.    Marland Kitchen omeprazole (PRILOSEC) 20 MG capsule Take 20 mg by mouth every morning.    . Pyridoxine HCl (VITAMIN B-6 PO) Take by mouth.    . cephALEXin (KEFLEX) 500 MG capsule Take 1 capsule (500 mg total) by mouth 4 (four) times daily. 40 capsule 0  . doxycycline (VIBRAMYCIN) 100 MG capsule Take 1 capsule (100 mg total) by mouth 2 (two) times daily. 20 capsule 0  . Soft Lens Products (VISINE FOR CONTACTS) SOLN Place 2 drops into both eyes 3 (three) times daily as needed (dry eyes).      Allergies  Allergen Reactions  . Sulfa Antibiotics Itching and Rash     Exam:  BP 178/76 mmHg  Pulse 67  Temp(Src) 98.2 F (36.8 C) (Oral)  Resp 16  SpO2 99% Gen: Well NAD Left hand: Stellate laceration to the ulnar aspect of the hand. Mild erythema and tenderness. No pus expressed. Hand motion strength Refill and sensation are intact distally.  No results found for this or any previous visit (from the past 24 hour(s)). No results found.  Assessment and Plan: 68 y.o. female with left hand laceration old. Patient was given a tetanus vaccination prior to discharge. Additionally she will be treated with doxycycline and Keflex. Return to clinic in 2 days for wound recheck.  Discussed warning signs or symptoms. Please see discharge instructions. Patient expresses understanding.     Gregor Hams, MD 05/26/14 [phone]

## 2014-05-26 NOTE — ED Notes (Signed)
Reports falling and scraping left hand against coffee table 1 wk ago, causing laceration.  Has been applying Neosporin.  Wound draining some, redness noted around wound.

## 2014-05-26 NOTE — Discharge Instructions (Signed)
Thank you for coming in today. Keep the wound covered with ointment.  Take both antibiotics as directed.  Come back in 2 days for recheck.    Delayed Wound Closure Sometimes, your health care provider will decide to delay closing a wound for several days. This is done when the wound is badly bruised, dirty, or when it has been several hours since the injury happened. By delaying the closure of your wound, the risk of infection is reduced. Wounds that are closed in 3-7 days after being cleaned up and dressed heal just as well as those that are closed right away. HOME CARE INSTRUCTIONS  Rest and elevate the injured area until the pain and swelling are gone.  Have your wound checked as instructed by your health care provider. SEEK MEDICAL CARE IF:  You develop unusual or increased swelling or redness around the wound.  You have increasing pain or tenderness.  There is increasing fluid (drainage) or a bad smelling drainage coming from the wound. Document Released: 05/18/2005 Document Revised: 05/23/2013 Document Reviewed: 11/15/2012 [facility] Patient Information 2015 [place], [place]. This information is not intended to replace advice given to you by your health care provider. Make sure you discuss any questions you have with your health care provider.

## 2014-09-13 ENCOUNTER — Other Ambulatory Visit (HOSPITAL_COMMUNITY): Payer: Self-pay | Admitting: Geriatric Medicine

## 2014-09-13 DIAGNOSIS — Z1231 Encounter for screening mammogram for malignant neoplasm of breast: Secondary | ICD-10-CM

## 2014-09-28 ENCOUNTER — Ambulatory Visit (HOSPITAL_COMMUNITY)
Admission: RE | Admit: 2014-09-28 | Discharge: 2014-09-28 | Disposition: A | Discharge status: Home / Self Care | Payer: Medicare Other | Source: Ambulatory Visit | Attending: Geriatric Medicine | Admitting: Geriatric Medicine

## 2014-09-28 DIAGNOSIS — Z1231 Encounter for screening mammogram for malignant neoplasm of breast: Secondary | ICD-10-CM | POA: Insufficient documentation

## 2014-09-28 DIAGNOSIS — E1151 Type 2 diabetes mellitus with diabetic peripheral angiopathy without gangrene: Secondary | ICD-10-CM | POA: Diagnosis not present

## 2014-09-28 DIAGNOSIS — I10 Essential (primary) hypertension: Secondary | ICD-10-CM | POA: Diagnosis not present

## 2014-09-28 DIAGNOSIS — E1142 Type 2 diabetes mellitus with diabetic polyneuropathy: Secondary | ICD-10-CM | POA: Diagnosis not present

## 2014-10-02 DIAGNOSIS — E1142 Type 2 diabetes mellitus with diabetic polyneuropathy: Secondary | ICD-10-CM | POA: Diagnosis not present

## 2014-10-02 DIAGNOSIS — E039 Hypothyroidism, unspecified: Secondary | ICD-10-CM | POA: Diagnosis not present

## 2014-10-02 DIAGNOSIS — E1151 Type 2 diabetes mellitus with diabetic peripheral angiopathy without gangrene: Secondary | ICD-10-CM | POA: Diagnosis not present

## 2014-10-02 DIAGNOSIS — E279 Disorder of adrenal gland, unspecified: Secondary | ICD-10-CM | POA: Diagnosis not present

## 2014-10-02 DIAGNOSIS — Z720 Tobacco use: Secondary | ICD-10-CM | POA: Diagnosis not present

## 2014-10-17 DIAGNOSIS — M25561 Pain in right knee: Secondary | ICD-10-CM | POA: Diagnosis not present

## 2014-10-17 DIAGNOSIS — M1711 Unilateral primary osteoarthritis, right knee: Secondary | ICD-10-CM | POA: Diagnosis not present

## 2014-11-06 DIAGNOSIS — M1711 Unilateral primary osteoarthritis, right knee: Secondary | ICD-10-CM | POA: Diagnosis not present

## 2014-11-06 DIAGNOSIS — M1990 Unspecified osteoarthritis, unspecified site: Secondary | ICD-10-CM | POA: Diagnosis not present

## 2014-11-28 NOTE — H&P (Signed)
TOTAL KNEE ADMISSION H&P  Patient is being admitted for right total knee arthroplasty.  Subjective:  Chief Complaint:    Right knee primary OA / pain.  HPI: Sharon Lowe, 69 y.o. female, has a history of pain and functional disability in the right knee due to arthritis and has failed non-surgical conservative treatments for greater than 12 weeks to include NSAID's and/or analgesics, corticosteriod injections, use of assistive devices and activity modification.  Onset of symptoms was gradual, starting 5+ years ago with gradually worsening course since that time. The patient noted prior procedures on the knee to include  arthroplasty on the left knee(s).  Patient currently rates pain in the right knee(s) at 8 out of 10 with activity. Patient has night pain, worsening of pain with activity and weight bearing, pain that interferes with activities of daily living, pain with passive range of motion, crepitus and joint swelling.  Patient has evidence of periarticular osteophytes and joint space narrowing by imaging studies.  There is no active infection.  Risks, benefits and expectations were discussed with the patient.  Risks including but not limited to the risk of anesthesia, blood clots, nerve damage, blood vessel damage, failure of the prosthesis, infection and up to and including death.  Patient understand the risks, benefits and expectations and wishes to proceed with surgery.   PCP: Mathews Argyle, MD  D/C Plans:      SNF - [facility]  Post-op Meds:       No Rx given   Tranexamic Acid:      To be given - topically (previous TIA)  Decadron:      Is to be given  FYI:     ASA post-op  Norco post-op  Nicotine patch 14 mg    Patient Active Problem List   Diagnosis Date Noted  . Osteopenia 12/20/2013  . Biliary calculi 12/20/2013  . Clinical depression 12/20/2013  . Adult hypothyroidism 12/20/2013  . UTI (lower urinary tract infection) 12/05/2013  . Sepsis 12/05/2013  . Acute  pyelonephritis: Probable 12/05/2013  . Acute bilateral obstructive uropathy 12/05/2013  . ARF (acute renal failure) 12/05/2013  . Recurrent nephrolithiasis 12/05/2013  . Expected blood loss anemia 10/31/2013  . Overweight (BMI 25.0-29.9) 10/31/2013  . Hyponatremia 10/31/2013  . S/P left knee revision 10/30/2013  . Chest pain 10/22/2013  . Tobacco abuse 10/22/2013  . Other and unspecified hyperlipidemia 10/22/2013  . Leukocytosis, unspecified 10/22/2013  . Adrenal hyperplasia 12/28/2012  . Elevated cortisol level 12/28/2012  . 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 DM (diabetes mellitus) for 20 years ID 07/24/2011  . Hypertension 07/24/2011   Past Medical History  Diagnosis Date  . Hypertension   . Hyperlipidemia   . GERD (gastroesophageal reflux disease)   . Hypothyroidism   . Depression   . Arthritis   . History of TIA (transient ischemic attack)     2000-- no residual  . Moderate obstructive sleep apnea     per study 03-06-2010--  non-compliant cpap  . Type 2 diabetes mellitus   . Bilateral ureteral calculi   . History of kidney stones   . History of fractured rib     fell may 2015  . Urgency of urination   . Wears contact lenses   . Heart murmur     asymptomatic    Past Surgical History  Procedure Laterality Date  . Knee arthroscopy Right   . Total knee revision with scar debridement/patella revision with poly exchange Left 10/30/2013  Procedure: LEFT  TOTAL  KNEE  REVISION;  Surgeon: Mauri Pole, MD;  Location: [facility];  Service: Orthopedics;  Laterality: Left;  . Cystoscopy w/ ureteral stent placement Bilateral 12/05/2013    Procedure: CYSTOSCOPY WITH RETROGRADE PYELOGRAM/URETERAL STENT PLACEMENT;  Surgeon: Ardis Hughs, MD;  Location: [facility];  Service: Urology;  Laterality: Bilateral;  . Laparoscopic gastric bypass  08-18-2010    AND REPAIR VENTRAL HERNIA  . Shoulder open rotator cuff repair Left 07-12-2002  . Total knee arthroplasty Left  11-17-2002  . Revision total knee arthroplasty Left 01-05-2005  . Total abdominal hysterectomy w/ bilateral salpingoophorectomy  1995  . Exploratomy laparotomy for abd. 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 Tonsillectomy  CHILD  . Cystoscopy with ureteroscopy and stent placement Bilateral 12/20/2013    Procedure: CYSTOSCOPY WITH BILATERAL URETEROSCOPY, LASER LITHOTRIPSY AND STENT EXCHANGE;  Surgeon: Ardis Hughs, MD;  Location: [facility];  Service: Urology;  Laterality: Bilateral;  . Holmium laser application Bilateral 7/74/1287    Procedure: HOLMIUM LASER APPLICATION;  Surgeon: Ardis Hughs, MD;  Location: [facility];  Service: Urology;  Laterality: Bilateral;    No prescriptions prior to admission   Allergies  Allergen Reactions  . Sulfa Antibiotics Itching and Rash    History  Substance Use Topics  . Smoking status: Current Every Day Smoker -- 0.75 packs/day for 30 years    Types: Cigarettes  . Smokeless tobacco: Never Used  . Alcohol Use: Yes     Comment: occasional    Family History  Problem Relation Age of Onset  . Diabetes Mother   . Hypertension Mother   . Hyperlipidemia Mother   . Heart disease Father   . Cancer Maternal Uncle     prostate, colon  . Cancer Paternal Grandmother     unaware of what kind     Review of Systems  Constitutional: Negative.   HENT: Negative.   Eyes: Negative.   Respiratory: Negative.   Cardiovascular: Negative.   Gastrointestinal: Positive for heartburn.  Genitourinary: Negative.   Musculoskeletal: Positive for joint pain.  Skin: Negative.   Neurological: Negative.   Endo/Heme/Allergies: Negative.   Psychiatric/Behavioral: Negative.     Objective:  Physical Exam  Constitutional: She is oriented to person, place, and time. She appears well-developed and well-nourished.  HENT:  Head: Normocephalic and atraumatic.  Eyes: Pupils are equal, round, and reactive to light.  Neck: Neck  supple. No JVD present. No tracheal deviation present. No thyromegaly present.  Cardiovascular: Normal rate, regular rhythm and intact distal pulses.   Murmur heard. Respiratory: Effort normal and breath sounds normal. No stridor. No respiratory distress. She has no wheezes.  GI: Soft. There is no tenderness. There is no guarding.  Musculoskeletal:       Right knee: She exhibits decreased range of motion, swelling and bony tenderness. She exhibits no ecchymosis, no deformity, no laceration and no erythema. Tenderness found.  Lymphadenopathy:    She has no cervical adenopathy.  Neurological: She is alert and oriented to person, place, and time.  Skin: Skin is warm and dry.  Psychiatric: She has a normal mood and affect.      Labs:  Estimated body mass index is 28.19 kg/(m^2) as calculated from the following:   Height as of 12/15/13: 5\' 7"  (1.702 m).   Weight as of 12/20/13: 81.647 kg (180 lb).   Imaging Review Plain radiographs demonstrate severe degenerative joint disease of the right knee(s). The overall  alignment is neutral. The bone quality appears to be good for age and reported activity level.  Assessment/Plan:  End stage arthritis, right knee   The patient history, physical examination, clinical judgment of the provider and imaging studies are consistent with end stage degenerative joint disease of the right knee(s) and total knee arthroplasty is deemed medically necessary. The treatment options including medical management, injection therapy arthroscopy and arthroplasty were discussed at length. The risks and benefits of total knee arthroplasty were presented and reviewed. The risks due to aseptic loosening, infection, stiffness, patella tracking problems, thromboembolic complications and other imponderables were discussed. The patient acknowledged the explanation, agreed to proceed with the plan and consent was signed. Patient is being admitted for inpatient treatment for surgery,  pain control, PT, OT, prophylactic antibiotics, VTE prophylaxis, progressive ambulation and ADL's and discharge planning. The patient is planning to be discharged to skilled nursing facility.     West Pugh Benny Henrie   PA-C  11/28/2014, 12:57 PM

## 2014-12-04 ENCOUNTER — Encounter (HOSPITAL_COMMUNITY): Payer: Self-pay

## 2014-12-04 ENCOUNTER — Encounter (HOSPITAL_COMMUNITY)
Admission: RE | Admit: 2014-12-04 | Discharge: 2014-12-04 | Disposition: A | Discharge status: Home / Self Care | Payer: Medicare Other | Source: Ambulatory Visit | Attending: Orthopedic Surgery | Admitting: Orthopedic Surgery

## 2014-12-04 DIAGNOSIS — Z01810 Encounter for preprocedural cardiovascular examination: Secondary | ICD-10-CM | POA: Diagnosis not present

## 2014-12-04 DIAGNOSIS — M1711 Unilateral primary osteoarthritis, right knee: Secondary | ICD-10-CM | POA: Insufficient documentation

## 2014-12-04 HISTORY — DX: Personal history of other diseases of the nervous system and sense organs: Z86.69

## 2014-12-04 HISTORY — DX: Pneumonia, unspecified organism: J18.9

## 2014-12-04 LAB — BASIC METABOLIC PANEL
ANION GAP: 10 (ref 5–15)
BUN: 20 mg/dL (ref 6–20)
CHLORIDE: 105 mmol/L (ref 101–111)
CO2: 27 mmol/L (ref 22–32)
Calcium: 9.4 mg/dL (ref 8.9–10.3)
Creatinine, Ser: 0.96 mg/dL (ref 0.44–1.00)
GFR calc Af Amer: >60 mL/min (ref >60)
GFR calc non Af Amer: 59 mL/min — ABNORMAL LOW (ref >60)
Glucose, Bld: 103 mg/dL — ABNORMAL HIGH (ref 65–99)
Potassium: 4 mmol/L (ref 3.5–5.1)
SODIUM: 142 mmol/L (ref 135–145)

## 2014-12-04 LAB — SURGICAL PCR SCREEN
MRSA, PCR: NEGATIVE
Staphylococcus aureus: NEGATIVE

## 2014-12-04 LAB — CBC
HEMATOCRIT: 40.7 % (ref 36.0–46.0)
HEMOGLOBIN: 13 g/dL (ref 12.0–15.0)
MCH: 31 pg (ref 26.0–34.0)
MCHC: 31.9 g/dL (ref 30.0–36.0)
MCV: 96.9 fL (ref 78.0–100.0)
Platelets: 327 10*3/uL (ref 150–400)
RBC: 4.2 MIL/uL (ref 3.87–5.11)
RDW: 14.6 % (ref 11.5–15.5)
WBC: 10.1 10*3/uL (ref 4.0–10.5)

## 2014-12-04 LAB — APTT: aPTT: 31 seconds (ref 24–37)

## 2014-12-04 LAB — PROTIME-INR
INR: 1.01 (ref 0.00–1.49)
PROTHROMBIN TIME: 13.5 s (ref 11.6–15.2)

## 2014-12-04 NOTE — Patient Instructions (Addendum)
Sharon Lowe  12/04/2014   Your procedure is scheduled on: Monday 12/10/14  Report to [facility] Main  Entrance take [facility]  elevators to 3rd floor to  [place] at 10:30 AM.  Call this number if you have problems the morning of surgery [phone]   Remember: ONLY 1 PERSON MAY GO WITH YOU TO SHORT STAY TO GET  READY MORNING OF [place].  Do not eat food or drink liquids :After Midnight.    Take these medicines the morning of surgery with A SIP OF WATER: bupropion, levothyroxine, loratadine, omeprazole, eye drops if needed                               You may not have any metal on your body including hair pins and              piercings  Do not wear jewelry, make-up, lotions, powders or perfumes, deodorant             Do not wear nail polish.  Do not shave  48 hours prior to surgery.              Men may shave face and neck.  Do not bring valuables to the hospital. [place].  Contacts, dentures or bridgework may not be worn into surgery.  Leave suitcase in the car. After surgery it may be brought to your room.              Please read over the following fact sheets you were given: MRSA information  _____________________________________________________________________  [facility] - Preparing for Surgery Before surgery, you can play an important role.  Because skin is not sterile, your skin needs to be as free of germs as possible.  You can reduce the number of germs on your skin by washing with CHG (chlorahexidine gluconate) soap before surgery.  CHG is an antiseptic cleaner which kills germs and bonds with the skin to continue killing germs even after washing. Please DO NOT use if you have an allergy to CHG or antibacterial soaps.  If your skin becomes reddened/irritated stop using the CHG and inform your nurse when you arrive at Short Stay. Do not shave (including legs and underarms) for at least 48  hours prior to the first CHG shower.  You may shave your face/neck. Please follow these instructions carefully:  1.  Shower with CHG Soap the night before surgery and the  morning of Surgery.  2.  If you choose to wash your hair, wash your hair first as usual with your  normal  shampoo.  3.  After you shampoo, rinse your hair and body thoroughly to remove the  shampoo.                            4.  Use CHG as you would any other liquid soap.  You can apply chg directly  to the skin and wash                       Gently with a scrungie or clean washcloth.  5.  Apply the CHG Soap to your body ONLY FROM THE NECK DOWN.  Do not use on face/ open                           Wound or open sores. Avoid contact with eyes, ears mouth and genitals (private parts).                       Wash face,  Genitals (private parts) with your normal soap.             6.  Wash thoroughly, paying special attention to the area where your surgery  will be performed.  7.  Thoroughly rinse your body with warm water from the neck down.  8.  DO NOT shower/wash with your normal soap after using and rinsing off  the CHG Soap.                9.  Pat yourself dry with a clean towel.            10.  Wear clean pajamas.            11.  Place clean sheets on your bed the night of your first shower and do not  sleep with pets. Day of Surgery : Do not apply any lotions/deodorants the morning of surgery.  Please wear clean clothes to the hospital/surgery center.  FAILURE TO FOLLOW THESE INSTRUCTIONS MAY RESULT IN THE CANCELLATION OF YOUR SURGERY PATIENT SIGNATURE_________________________________  NURSE SIGNATURE__________________________________  ________________________________________________________________________  WHAT IS A BLOOD TRANSFUSION? Blood Transfusion Information  A transfusion is the replacement of blood or some of its parts. Blood is made up of multiple cells which provide different functions.  Red blood cells  carry oxygen and are used for blood loss replacement.  White blood cells fight against infection.  Platelets control bleeding.  Plasma helps clot blood.  Other blood products are available for specialized needs, such as hemophilia or other clotting disorders. BEFORE THE TRANSFUSION  Who gives blood for transfusions?   Healthy volunteers who are fully evaluated to make sure their blood is safe. This is blood bank blood. Transfusion therapy is the safest it has ever been in the practice of medicine. Before blood is taken from a donor, a complete history is taken to make sure that person has no history of diseases nor engages in risky social behavior (examples are intravenous drug use or sexual activity with multiple partners). The donor's travel history is screened to minimize risk of transmitting infections, such as malaria. The donated blood is tested for signs of infectious diseases, such as HIV and hepatitis. The blood is then tested to be sure it is compatible with you in order to minimize the chance of a transfusion reaction. If you or a relative donates blood, this is often done in anticipation of surgery and is not appropriate for emergency situations. It takes many days to process the donated blood. RISKS AND COMPLICATIONS Although transfusion therapy is very safe and saves many lives, the main dangers of transfusion include:  1. Getting an infectious disease. 2. Developing a transfusion reaction. This is an allergic reaction to something in the blood you were given. Every precaution is taken to prevent this. The decision to have a blood transfusion has been considered carefully by your caregiver before blood is given. Blood is not given unless the benefits outweigh the risks. AFTER THE TRANSFUSION  Right after receiving a blood transfusion, you will usually feel much better and more energetic. This is especially  true if your red blood cells have gotten low (anemic). The transfusion  raises the level of the red blood cells which carry oxygen, and this usually causes an energy increase.  The nurse administering the transfusion will monitor you carefully for complications. HOME CARE INSTRUCTIONS  No special instructions are needed after a transfusion. You may find your energy is better. Speak with your caregiver about any limitations on activity for underlying diseases you may have. SEEK MEDICAL CARE IF:   Your condition is not improving after your transfusion.  You develop redness or irritation at the intravenous (IV) site. SEEK IMMEDIATE MEDICAL CARE IF:  Any of the following symptoms occur over the next 12 hours:  Shaking chills.  You have a temperature by mouth above 102 F (38.9 C), not controlled by medicine.  Chest, back, or muscle pain.  People around you feel you are not acting correctly or are confused.  Shortness of breath or difficulty breathing.  Dizziness and fainting.  You get a rash or develop hives.  You have a decrease in urine output.  Your urine turns a dark color or changes to pink, red, or brown. Any of the following symptoms occur over the next 10 days:  You have a temperature by mouth above 102 F (38.9 C), not controlled by medicine.  Shortness of breath.  Weakness after normal activity.  The white part of the eye turns yellow (jaundice).  You have a decrease in the amount of urine or are urinating less often.  Your urine turns a dark color or changes to pink, red, or brown. Document Released: 05/15/2000 Document Revised: 08/10/2011 Document Reviewed: 01/02/2008 ExitCare Patient Information 2014 [place].  _______________________________________________________________________  Incentive Spirometer  An incentive spirometer is a tool that can help keep your lungs clear and active. This tool measures how well you are filling your lungs with each breath. Taking long deep breaths may help reverse or decrease the chance  of developing breathing (pulmonary) problems (especially infection) following:  A long period of time when you are unable to move or be active. BEFORE THE PROCEDURE   If the spirometer includes an indicator to show your best effort, your nurse or respiratory therapist will set it to a desired goal.  If possible, sit up straight or lean slightly forward. Try not to slouch.  Hold the incentive spirometer in an upright position. INSTRUCTIONS FOR USE  3. Sit on the edge of your bed if possible, or sit up as far as you can in bed or on a chair. 4. Hold the incentive spirometer in an upright position. 5. Breathe out normally. 6. Place the mouthpiece in your mouth and seal your lips tightly around it. 7. Breathe in slowly and as deeply as possible, raising the piston or the ball toward the top of the column. 8. Hold your breath for 3-5 seconds or for as long as possible. Allow the piston or ball to fall to the bottom of the column. 9. Remove the mouthpiece from your mouth and breathe out normally. 10. Rest for a few seconds and repeat Steps 1 through 7 at least 10 times every 1-2 hours when you are awake. Take your time and take a few normal breaths between deep breaths. 11. The spirometer may include an indicator to show your best effort. Use the indicator as a goal to work toward during each repetition. 12. After each set of 10 deep breaths, practice coughing to be sure your lungs are clear. If you have  an incision (the cut made at the time of surgery), support your incision when coughing by placing a pillow or rolled up towels firmly against it. Once you are able to get out of bed, walk around indoors and cough well. You may stop using the incentive spirometer when instructed by your caregiver.  RISKS AND COMPLICATIONS  Take your time so you do not get dizzy or light-headed.  If you are in pain, you may need to take or ask for pain medication before doing incentive spirometry. It is harder to  take a deep breath if you are having pain. AFTER USE  Rest and breathe slowly and easily.  It can be helpful to keep track of a log of your progress. Your caregiver can provide you with a simple table to help with this. If you are using the spirometer at home, follow these instructions: [place] IF:   You are having difficultly using the spirometer.  You have trouble using the spirometer as often as instructed.  Your pain medication is not giving enough relief while using the spirometer.  You develop fever of 100.5 F (38.1 C) or higher. SEEK IMMEDIATE MEDICAL CARE IF:   You cough up bloody sputum that had not been present before.  You develop fever of 102 F (38.9 C) or greater.  You develop worsening pain at or near the incision site. MAKE SURE YOU:   Understand these instructions.  Will watch your condition.  Will get help right away if you are not doing well or get worse. Document Released: 09/28/2006 Document Revised: 08/10/2011 Document Reviewed: 11/29/2006 [facility] Patient Information 2014 [place], [place].   ________________________________________________________________________

## 2014-12-10 ENCOUNTER — Inpatient Hospital Stay (HOSPITAL_COMMUNITY): Payer: Medicare Other | Admitting: Certified Registered Nurse Anesthetist

## 2014-12-10 ENCOUNTER — Inpatient Hospital Stay (HOSPITAL_COMMUNITY)
Admission: RE | Admit: 2014-12-10 | Discharge: 2014-12-12 | DRG: 470 | Disposition: A | Discharge status: Skilled Nursing Facility | Payer: Medicare Other | Source: Ambulatory Visit | Attending: Orthopedic Surgery | Admitting: Orthopedic Surgery

## 2014-12-10 ENCOUNTER — Encounter (HOSPITAL_COMMUNITY): Payer: Self-pay

## 2014-12-10 ENCOUNTER — Encounter (HOSPITAL_COMMUNITY)
Admission: RE | Disposition: A | Discharge status: Skilled Nursing Facility | Payer: Self-pay | Source: Ambulatory Visit | Attending: Orthopedic Surgery

## 2014-12-10 DIAGNOSIS — E785 Hyperlipidemia, unspecified: Secondary | ICD-10-CM | POA: Diagnosis present

## 2014-12-10 DIAGNOSIS — F17210 Nicotine dependence, cigarettes, uncomplicated: Secondary | ICD-10-CM | POA: Diagnosis present

## 2014-12-10 DIAGNOSIS — E039 Hypothyroidism, unspecified: Secondary | ICD-10-CM | POA: Diagnosis present

## 2014-12-10 DIAGNOSIS — Z8249 Family history of ischemic heart disease and other diseases of the circulatory system: Secondary | ICD-10-CM

## 2014-12-10 DIAGNOSIS — K219 Gastro-esophageal reflux disease without esophagitis: Secondary | ICD-10-CM | POA: Diagnosis not present

## 2014-12-10 DIAGNOSIS — Z833 Family history of diabetes mellitus: Secondary | ICD-10-CM

## 2014-12-10 DIAGNOSIS — F329 Major depressive disorder, single episode, unspecified: Secondary | ICD-10-CM | POA: Diagnosis not present

## 2014-12-10 DIAGNOSIS — Z9884 Bariatric surgery status: Secondary | ICD-10-CM

## 2014-12-10 DIAGNOSIS — Z8673 Personal history of transient ischemic attack (TIA), and cerebral infarction without residual deficits: Secondary | ICD-10-CM | POA: Diagnosis not present

## 2014-12-10 DIAGNOSIS — E119 Type 2 diabetes mellitus without complications: Secondary | ICD-10-CM | POA: Diagnosis present

## 2014-12-10 DIAGNOSIS — M1711 Unilateral primary osteoarthritis, right knee: Secondary | ICD-10-CM | POA: Diagnosis present

## 2014-12-10 DIAGNOSIS — Z6830 Body mass index (BMI) 30.0-30.9, adult: Secondary | ICD-10-CM

## 2014-12-10 DIAGNOSIS — M179 Osteoarthritis of knee, unspecified: Secondary | ICD-10-CM | POA: Diagnosis not present

## 2014-12-10 DIAGNOSIS — I10 Essential (primary) hypertension: Secondary | ICD-10-CM | POA: Diagnosis present

## 2014-12-10 DIAGNOSIS — Z96651 Presence of right artificial knee joint: Secondary | ICD-10-CM

## 2014-12-10 DIAGNOSIS — M25561 Pain in right knee: Secondary | ICD-10-CM | POA: Diagnosis not present

## 2014-12-10 DIAGNOSIS — R011 Cardiac murmur, unspecified: Secondary | ICD-10-CM | POA: Diagnosis not present

## 2014-12-10 DIAGNOSIS — Z96659 Presence of unspecified artificial knee joint: Secondary | ICD-10-CM

## 2014-12-10 DIAGNOSIS — E669 Obesity, unspecified: Secondary | ICD-10-CM | POA: Diagnosis present

## 2014-12-10 HISTORY — PX: TOTAL KNEE ARTHROPLASTY: SHX125

## 2014-12-10 LAB — GLUCOSE, CAPILLARY
GLUCOSE-CAPILLARY: 92 mg/dL (ref 65–99)
GLUCOSE-CAPILLARY: 393 mg/dL — AB (ref 65–99)
Glucose-Capillary: 96 mg/dL (ref 65–99)
Glucose-Capillary: 141 mg/dL — ABNORMAL HIGH (ref 65–99)

## 2014-12-10 LAB — TYPE AND SCREEN
ABO/RH(D): A NEG
Antibody Screen: NEGATIVE

## 2014-12-10 SURGERY — ARTHROPLASTY, KNEE, TOTAL
Anesthesia: Monitor Anesthesia Care | Site: Knee | Laterality: Right

## 2014-12-10 MED ORDER — CYANOCOBALAMIN 500 MCG PO TABS
2500.0000 ug | ORAL_TABLET | Freq: Every day | ORAL | Status: DC
  Administered 2014-12-11 – 2014-12-12 (×2): 2500 ug via ORAL
  Filled 2014-12-10 (×2): qty 5

## 2014-12-10 MED ORDER — DEXAMETHASONE SODIUM PHOSPHATE 10 MG/ML IJ SOLN: 10.0000 mg | Freq: Once | INTRAMUSCULAR | Status: DC

## 2014-12-10 MED ORDER — FENTANYL CITRATE (PF) 100 MCG/2ML IJ SOLN
INTRAMUSCULAR | Status: AC
  Filled 2014-12-10: qty 2

## 2014-12-10 MED ORDER — BUPIVACAINE-EPINEPHRINE (PF) 0.25% -1:200000 IJ SOLN
INTRAMUSCULAR | Status: AC
  Filled 2014-12-10: qty 30

## 2014-12-10 MED ORDER — LIDOCAINE HCL (CARDIAC) 20 MG/ML IV SOLN
INTRAVENOUS | Status: DC | PRN
  Administered 2014-12-10: 50 mg via INTRAVENOUS

## 2014-12-10 MED ORDER — PROPOFOL 10 MG/ML IV BOLUS
INTRAVENOUS | Status: DC | PRN
  Administered 2014-12-10: 10 mg via INTRAVENOUS
  Administered 2014-12-10: 30 mg via INTRAVENOUS
  Administered 2014-12-10: 20 mg via INTRAVENOUS

## 2014-12-10 MED ORDER — NON FORMULARY: 20.0000 mg | Freq: Every day | Status: DC

## 2014-12-10 MED ORDER — CEFAZOLIN SODIUM-DEXTROSE 2-3 GM-% IV SOLR
INTRAVENOUS | Status: AC
  Filled 2014-12-10: qty 50

## 2014-12-10 MED ORDER — ESMOLOL HCL 10 MG/ML IV SOLN
INTRAVENOUS | Status: DC | PRN
  Administered 2014-12-10 (×2): 10 mg via INTRAVENOUS

## 2014-12-10 MED ORDER — PHENYLEPHRINE HCL 10 MG/ML IJ SOLN
INTRAMUSCULAR | Status: DC | PRN
  Administered 2014-12-10 (×2): 80 ug via INTRAVENOUS
  Administered 2014-12-10: 40 ug via INTRAVENOUS
  Administered 2014-12-10: 80 ug via INTRAVENOUS

## 2014-12-10 MED ORDER — INSULIN ASPART 100 UNIT/ML ~~LOC~~ SOLN
0.0000 [IU] | Freq: Three times a day (TID) | SUBCUTANEOUS | Status: DC
  Administered 2014-12-11: 3 [IU] via SUBCUTANEOUS
  Administered 2014-12-11: 5 [IU] via SUBCUTANEOUS

## 2014-12-10 MED ORDER — HYDROCODONE-ACETAMINOPHEN 7.5-325 MG PO TABS
1.0000 | ORAL_TABLET | ORAL | Status: DC
  Administered 2014-12-10 – 2014-12-12 (×12): 2 via ORAL
  Filled 2014-12-10 (×12): qty 2

## 2014-12-10 MED ORDER — MIDAZOLAM HCL 2 MG/2ML IJ SOLN
INTRAMUSCULAR | Status: AC
  Filled 2014-12-10: qty 2

## 2014-12-10 MED ORDER — BISACODYL 10 MG RE SUPP: 10.0000 mg | Freq: Every day | RECTAL | Status: DC | PRN

## 2014-12-10 MED ORDER — NICOTINE 14 MG/24HR TD PT24
14.0000 mg | MEDICATED_PATCH | Freq: Every day | TRANSDERMAL | Status: DC
  Administered 2014-12-10 – 2014-12-11 (×2): 14 mg via TRANSDERMAL
  Filled 2014-12-10 (×3): qty 1

## 2014-12-10 MED ORDER — LIRAGLUTIDE 18 MG/3ML ~~LOC~~ SOPN
1.2000 mg | PEN_INJECTOR | Freq: Every day | SUBCUTANEOUS | Status: DC
  Administered 2014-12-11: 1.2 mg via SUBCUTANEOUS

## 2014-12-10 MED ORDER — HYDROCHLOROTHIAZIDE 12.5 MG PO CAPS
12.5000 mg | ORAL_CAPSULE | Freq: Every morning | ORAL | Status: DC
  Administered 2014-12-11 – 2014-12-12 (×2): 12.5 mg via ORAL
  Filled 2014-12-10 (×2): qty 1

## 2014-12-10 MED ORDER — VITAMIN B-12 2500 MCG SL SUBL: 2500.0000 ug | SUBLINGUAL_TABLET | Freq: Every morning | SUBLINGUAL | Status: DC

## 2014-12-10 MED ORDER — SODIUM CHLORIDE 0.9 % IV SOLN
2000.0000 mg | Freq: Once | INTRAVENOUS | Status: DC
  Filled 2014-12-10: qty 20

## 2014-12-10 MED ORDER — PHENYLEPHRINE 40 MCG/ML (10ML) SYRINGE FOR IV PUSH (FOR BLOOD PRESSURE SUPPORT)
PREFILLED_SYRINGE | INTRAVENOUS | Status: AC
  Filled 2014-12-10: qty 10

## 2014-12-10 MED ORDER — PROPOFOL 10 MG/ML IV BOLUS
INTRAVENOUS | Status: AC
  Filled 2014-12-10: qty 20

## 2014-12-10 MED ORDER — DEXAMETHASONE SODIUM PHOSPHATE 10 MG/ML IJ SOLN
INTRAMUSCULAR | Status: DC | PRN
  Administered 2014-12-10: 10 mg via INTRAVENOUS

## 2014-12-10 MED ORDER — MIDAZOLAM HCL 5 MG/5ML IJ SOLN
INTRAMUSCULAR | Status: DC | PRN
  Administered 2014-12-10: 2 mg via INTRAVENOUS

## 2014-12-10 MED ORDER — METOCLOPRAMIDE HCL 5 MG/ML IJ SOLN: 5.0000 mg | Freq: Three times a day (TID) | INTRAMUSCULAR | Status: DC | PRN

## 2014-12-10 MED ORDER — ONDANSETRON HCL 4 MG/2ML IJ SOLN: 4.0000 mg | Freq: Four times a day (QID) | INTRAMUSCULAR | Status: DC | PRN

## 2014-12-10 MED ORDER — FERROUS SULFATE 325 (65 FE) MG PO TABS
325.0000 mg | ORAL_TABLET | Freq: Three times a day (TID) | ORAL | Status: DC
  Administered 2014-12-10 – 2014-12-12 (×6): 325 mg via ORAL
  Filled 2014-12-10 (×8): qty 1

## 2014-12-10 MED ORDER — BUPIVACAINE IN DEXTROSE 0.75-8.25 % IT SOLN
INTRATHECAL | Status: DC | PRN
  Administered 2014-12-10: 1.6 mL via INTRATHECAL

## 2014-12-10 MED ORDER — SODIUM CHLORIDE 0.9 % IJ SOLN
INTRAMUSCULAR | Status: DC | PRN
  Administered 2014-12-10: 30 mL

## 2014-12-10 MED ORDER — DIPHENHYDRAMINE HCL 25 MG PO CAPS: 25.0000 mg | ORAL_CAPSULE | Freq: Four times a day (QID) | ORAL | Status: DC | PRN

## 2014-12-10 MED ORDER — DEXTROSE 5 % IV SOLN
500.0000 mg | Freq: Four times a day (QID) | INTRAVENOUS | Status: DC | PRN
  Administered 2014-12-10: 500 mg via INTRAVENOUS
  Filled 2014-12-10 (×2): qty 5

## 2014-12-10 MED ORDER — FENTANYL CITRATE (PF) 100 MCG/2ML IJ SOLN
INTRAMUSCULAR | Status: DC | PRN
  Administered 2014-12-10: 12.5 ug via INTRAVENOUS
  Administered 2014-12-10 (×3): 25 ug via INTRAVENOUS

## 2014-12-10 MED ORDER — LEVOTHYROXINE SODIUM 88 MCG PO TABS
88.0000 ug | ORAL_TABLET | Freq: Every day | ORAL | Status: DC
  Administered 2014-12-11 – 2014-12-12 (×2): 88 ug via ORAL
  Filled 2014-12-10 (×3): qty 1

## 2014-12-10 MED ORDER — KETOROLAC TROMETHAMINE 30 MG/ML IJ SOLN
INTRAMUSCULAR | Status: DC | PRN
  Administered 2014-12-10: 30 mg

## 2014-12-10 MED ORDER — OXYCODONE HCL 5 MG/5ML PO SOLN
5.0000 mg | Freq: Once | ORAL | Status: DC | PRN
  Filled 2014-12-10: qty 5

## 2014-12-10 MED ORDER — PHENOL 1.4 % MT LIQD
1.0000 | OROMUCOSAL | Status: DC | PRN
  Filled 2014-12-10: qty 177

## 2014-12-10 MED ORDER — HYDROMORPHONE HCL 2 MG/ML IJ SOLN
INTRAMUSCULAR | Status: AC
  Filled 2014-12-10: qty 1

## 2014-12-10 MED ORDER — ALUM & MAG HYDROXIDE-SIMETH 200-200-20 MG/5ML PO SUSP: 30.0000 mL | ORAL | Status: DC | PRN

## 2014-12-10 MED ORDER — LACTATED RINGERS IV SOLN
INTRAVENOUS | Status: DC
Start: 2014-12-10 — End: 2014-12-10
  Administered 2014-12-10 (×2): via INTRAVENOUS
  Administered 2014-12-10: 1000 mL via INTRAVENOUS

## 2014-12-10 MED ORDER — HYDROMORPHONE HCL 1 MG/ML IJ SOLN
0.5000 mg | INTRAMUSCULAR | Status: DC | PRN
  Administered 2014-12-11: 0.5 mg via INTRAVENOUS
  Filled 2014-12-10: qty 1

## 2014-12-10 MED ORDER — FENTANYL CITRATE (PF) 100 MCG/2ML IJ SOLN
25.0000 ug | INTRAMUSCULAR | Status: DC | PRN
  Administered 2014-12-10 (×3): 50 ug via INTRAVENOUS

## 2014-12-10 MED ORDER — DEXAMETHASONE SODIUM PHOSPHATE 10 MG/ML IJ SOLN
10.0000 mg | Freq: Once | INTRAMUSCULAR | Status: AC
  Administered 2014-12-11: 10 mg via INTRAVENOUS
  Filled 2014-12-10: qty 1

## 2014-12-10 MED ORDER — GLYCOPYRROLATE 0.2 MG/ML IJ SOLN
INTRAMUSCULAR | Status: DC | PRN
  Administered 2014-12-10 (×2): 0.2 mg via INTRAVENOUS

## 2014-12-10 MED ORDER — LIRAGLUTIDE 18 MG/3ML ~~LOC~~ SOPN
1.2000 mg | PEN_INJECTOR | Freq: Every day | SUBCUTANEOUS | Status: DC
Start: 2014-12-10 — End: 2014-12-10
  Filled 2014-12-10: qty 0.2

## 2014-12-10 MED ORDER — PROPOFOL INFUSION 10 MG/ML OPTIME
INTRAVENOUS | Status: DC | PRN
  Administered 2014-12-10: 50 ug/kg/min via INTRAVENOUS
  Administered 2014-12-10: 300 ug/kg/min via INTRAVENOUS

## 2014-12-10 MED ORDER — CEFAZOLIN SODIUM-DEXTROSE 2-3 GM-% IV SOLR
2.0000 g | INTRAVENOUS | Status: AC
  Administered 2014-12-10: 2 g via INTRAVENOUS

## 2014-12-10 MED ORDER — METFORMIN HCL 500 MG PO TABS
500.0000 mg | ORAL_TABLET | Freq: Two times a day (BID) | ORAL | Status: DC
  Administered 2014-12-10 – 2014-12-12 (×4): 500 mg via ORAL
  Filled 2014-12-10 (×6): qty 1

## 2014-12-10 MED ORDER — METOCLOPRAMIDE HCL 10 MG PO TABS: 5.0000 mg | ORAL_TABLET | Freq: Three times a day (TID) | ORAL | Status: DC | PRN

## 2014-12-10 MED ORDER — ASPIRIN EC 325 MG PO TBEC
325.0000 mg | DELAYED_RELEASE_TABLET | Freq: Two times a day (BID) | ORAL | Status: DC
  Administered 2014-12-11 – 2014-12-12 (×3): 325 mg via ORAL
  Filled 2014-12-10 (×5): qty 1

## 2014-12-10 MED ORDER — OMEPRAZOLE 20 MG PO CPDR
20.0000 mg | DELAYED_RELEASE_CAPSULE | Freq: Every day | ORAL | Status: DC
  Administered 2014-12-11 – 2014-12-12 (×2): 20 mg via ORAL
  Filled 2014-12-10 (×3): qty 1

## 2014-12-10 MED ORDER — LABETALOL HCL 5 MG/ML IV SOLN
INTRAVENOUS | Status: AC
  Filled 2014-12-10: qty 4

## 2014-12-10 MED ORDER — METHOCARBAMOL 500 MG PO TABS
500.0000 mg | ORAL_TABLET | Freq: Four times a day (QID) | ORAL | Status: DC | PRN
  Administered 2014-12-11 (×3): 500 mg via ORAL
  Filled 2014-12-10 (×4): qty 1

## 2014-12-10 MED ORDER — BUPROPION HCL ER (SR) 100 MG PO TB12
100.0000 mg | ORAL_TABLET | Freq: Every morning | ORAL | Status: DC
  Administered 2014-12-11 – 2014-12-12 (×2): 100 mg via ORAL
  Filled 2014-12-10 (×2): qty 1

## 2014-12-10 MED ORDER — CEFAZOLIN SODIUM-DEXTROSE 2-3 GM-% IV SOLR
2.0000 g | Freq: Four times a day (QID) | INTRAVENOUS | Status: AC
  Administered 2014-12-10 – 2014-12-11 (×2): 2 g via INTRAVENOUS
  Filled 2014-12-10 (×2): qty 50

## 2014-12-10 MED ORDER — MAGNESIUM CITRATE PO SOLN: 1.0000 | Freq: Once | ORAL | Status: AC | PRN

## 2014-12-10 MED ORDER — SODIUM CHLORIDE 0.9 % IR SOLN
Status: DC | PRN
  Administered 2014-12-10: 1000 mL

## 2014-12-10 MED ORDER — POTASSIUM CHLORIDE 2 MEQ/ML IV SOLN
INTRAVENOUS | Status: DC
  Administered 2014-12-10: 18:00:00 via INTRAVENOUS
  Filled 2014-12-10 (×10): qty 1000

## 2014-12-10 MED ORDER — BUPIVACAINE-EPINEPHRINE (PF) 0.25% -1:200000 IJ SOLN
INTRAMUSCULAR | Status: DC | PRN
  Administered 2014-12-10: 30 mL

## 2014-12-10 MED ORDER — POLYETHYLENE GLYCOL 3350 17 G PO PACK
17.0000 g | PACK | Freq: Two times a day (BID) | ORAL | Status: DC
  Administered 2014-12-11 – 2014-12-12 (×2): 17 g via ORAL

## 2014-12-10 MED ORDER — LABETALOL HCL 5 MG/ML IV SOLN
10.0000 mg | INTRAVENOUS | Status: DC | PRN
  Administered 2014-12-10: 10 mg via INTRAVENOUS

## 2014-12-10 MED ORDER — ATORVASTATIN CALCIUM 10 MG PO TABS
10.0000 mg | ORAL_TABLET | Freq: Every evening | ORAL | Status: DC
  Administered 2014-12-10 – 2014-12-11 (×2): 10 mg via ORAL
  Filled 2014-12-10 (×3): qty 1

## 2014-12-10 MED ORDER — OXYCODONE HCL 5 MG PO TABS: 5.0000 mg | ORAL_TABLET | Freq: Once | ORAL | Status: DC | PRN

## 2014-12-10 MED ORDER — DOCUSATE SODIUM 100 MG PO CAPS
100.0000 mg | ORAL_CAPSULE | Freq: Two times a day (BID) | ORAL | Status: DC
Start: 2014-12-10 — End: 2014-12-12
  Administered 2014-12-10 – 2014-12-12 (×4): 100 mg via ORAL

## 2014-12-10 MED ORDER — KETOROLAC TROMETHAMINE 30 MG/ML IJ SOLN
INTRAMUSCULAR | Status: AC
  Filled 2014-12-10: qty 1

## 2014-12-10 MED ORDER — ONDANSETRON HCL 4 MG PO TABS: 4.0000 mg | ORAL_TABLET | Freq: Four times a day (QID) | ORAL | Status: DC | PRN

## 2014-12-10 MED ORDER — MENTHOL 3 MG MT LOZG: 1.0000 | LOZENGE | OROMUCOSAL | Status: DC | PRN

## 2014-12-10 MED ORDER — SODIUM CHLORIDE 0.9 % IJ SOLN
INTRAMUSCULAR | Status: AC
  Filled 2014-12-10: qty 50

## 2014-12-10 MED ORDER — HYDROMORPHONE HCL 1 MG/ML IJ SOLN
INTRAMUSCULAR | Status: DC | PRN
  Administered 2014-12-10 (×4): 0.5 mg via INTRAVENOUS

## 2014-12-10 MED ORDER — LORATADINE 10 MG PO TABS
10.0000 mg | ORAL_TABLET | Freq: Every morning | ORAL | Status: DC
  Administered 2014-12-11 – 2014-12-12 (×2): 10 mg via ORAL
  Filled 2014-12-10 (×2): qty 1

## 2014-12-10 SURGICAL SUPPLY — 56 items
BAG DECANTER FOR FLEXI CONT (MISCELLANEOUS) IMPLANT
BAG SPEC THK2 15X12 ZIP CLS (MISCELLANEOUS) ×1
BAG ZIPLOCK 12X15 (MISCELLANEOUS) ×2 IMPLANT
BANDAGE ELASTIC 6 VELCRO ST LF (GAUZE/BANDAGES/DRESSINGS) ×3 IMPLANT
BANDAGE ESMARK 6X9 LF (GAUZE/BANDAGES/DRESSINGS) ×1 IMPLANT
BLADE SAW SGTL 13.0X1.19X90.0M (BLADE) ×3 IMPLANT
BNDG CMPR 9X6 STRL LF SNTH (GAUZE/BANDAGES/DRESSINGS) ×1
BNDG ESMARK 6X9 LF (GAUZE/BANDAGES/DRESSINGS) ×3
BONE CEMENT GENTAMICIN (Cement) ×6 IMPLANT
BOWL SMART MIX CTS (DISPOSABLE) ×3 IMPLANT
CAPT KNEE TOTAL 3 ATTUNE ×2 IMPLANT
CEMENT BONE GENTAMICIN 40 (Cement) IMPLANT
CUFF TOURN SGL QUICK 34 (TOURNIQUET CUFF) ×3
CUFF TRNQT CYL 34X4X40X1 (TOURNIQUET CUFF) ×1 IMPLANT
DECANTER SPIKE VIAL GLASS SM (MISCELLANEOUS) ×3 IMPLANT
DRAPE EXTREMITY T 121X128X90 (DRAPE) ×3 IMPLANT
DRAPE POUCH INSTRU U-SHP 10X18 (DRAPES) ×3 IMPLANT
DRAPE U-SHAPE 47X51 STRL (DRAPES) ×3 IMPLANT
DRSG AQUACEL AG ADV 3.5X10 (GAUZE/BANDAGES/DRESSINGS) ×3 IMPLANT
DURAPREP 26ML APPLICATOR (WOUND CARE) ×6 IMPLANT
ELECT REM PT RETURN 9FT ADLT (ELECTROSURGICAL) ×3
ELECTRODE REM PT RTRN 9FT ADLT (ELECTROSURGICAL) ×1 IMPLANT
FACESHIELD WRAPAROUND (MASK) ×15 IMPLANT
FACESHIELD WRAPAROUND OR TEAM (MASK) ×5 IMPLANT
GLOVE BIOGEL PI IND STRL 7.5 (GLOVE) ×1 IMPLANT
GLOVE BIOGEL PI IND STRL 8.5 (GLOVE) ×1 IMPLANT
GLOVE BIOGEL PI INDICATOR 7.5 (GLOVE) ×2
GLOVE BIOGEL PI INDICATOR 8.5 (GLOVE) ×2
GLOVE ECLIPSE 8.0 STRL XLNG CF (GLOVE) ×3 IMPLANT
GLOVE ORTHO TXT STRL SZ7.5 (GLOVE) ×6 IMPLANT
GOWN SPEC L3 XXLG W/TWL (GOWN DISPOSABLE) ×3 IMPLANT
GOWN STRL REUS W/TWL LRG LVL3 (GOWN DISPOSABLE) ×3 IMPLANT
HANDPIECE INTERPULSE COAX TIP (DISPOSABLE) ×3
KIT BASIN OR (CUSTOM PROCEDURE TRAY) ×3 IMPLANT
LIQUID BAND (GAUZE/BANDAGES/DRESSINGS) ×3 IMPLANT
MANIFOLD NEPTUNE II (INSTRUMENTS) ×3 IMPLANT
NDL SAFETY ECLIPSE 18X1.5 (NEEDLE) ×1 IMPLANT
NEEDLE HYPO 18GX1.5 SHARP (NEEDLE) ×3
PACK TOTAL JOINT (CUSTOM PROCEDURE TRAY) ×3 IMPLANT
PEN SKIN MARKING BROAD (MISCELLANEOUS) ×3 IMPLANT
POSITIONER SURGICAL ARM (MISCELLANEOUS) ×3 IMPLANT
SET HNDPC FAN SPRY TIP SCT (DISPOSABLE) ×1 IMPLANT
SET PAD KNEE POSITIONER (MISCELLANEOUS) ×3 IMPLANT
SUCTION FRAZIER 12FR DISP (SUCTIONS) ×3 IMPLANT
SUT MNCRL AB 4-0 PS2 18 (SUTURE) ×3 IMPLANT
SUT VIC AB 1 CT1 36 (SUTURE) ×3 IMPLANT
SUT VIC AB 2-0 CT1 27 (SUTURE) ×9
SUT VIC AB 2-0 CT1 TAPERPNT 27 (SUTURE) ×3 IMPLANT
SUT VLOC 180 0 24IN GS25 (SUTURE) ×3 IMPLANT
SYR 50ML LL SCALE MARK (SYRINGE) ×3 IMPLANT
TOWEL OR 17X26 10 PK STRL BLUE (TOWEL DISPOSABLE) ×3 IMPLANT
TOWEL OR NON WOVEN STRL DISP B (DISPOSABLE) IMPLANT
TRAY FOLEY W/METER SILVER 14FR (SET/KITS/TRAYS/PACK) ×3 IMPLANT
WATER STERILE IRR 1500ML POUR (IV SOLUTION) ×3 IMPLANT
WRAP KNEE MAXI GEL POST OP (GAUZE/BANDAGES/DRESSINGS) ×1 IMPLANT
YANKAUER SUCT BULB TIP 10FT TU (MISCELLANEOUS) ×3 IMPLANT

## 2014-12-10 NOTE — Anesthesia Postprocedure Evaluation (Signed)
  Anesthesia Post-op Note  Patient: Sharon Lowe  Procedure(s) Performed: Procedure(s): TOTAL KNEE ARTHROPLASTY (Right)  Patient Location: PACU  Anesthesia Type:Spinal  Level of Consciousness: awake, alert  and oriented  Airway and Oxygen Therapy: Patient Spontanous Breathing  Post-op Pain: none  Post-op Assessment: Post-op Vital signs reviewed, Patient's Cardiovascular Status Stable and Respiratory Function Stable LLE Motor Response: Purposeful movement   RLE Motor Response: Purposeful movement   L Sensory Level: S1-Sole of foot, small toes R Sensory Level: S1-Sole of foot, small toes  Post-op Vital Signs: Reviewed and stable  Last Vitals:  Filed Vitals:   12/10/14 1830  BP: 142/70  Pulse: 75  Temp: 36.9 C  Resp: 16    Complications: No apparent anesthesia complications

## 2014-12-10 NOTE — Plan of Care (Signed)
Problem: Phase I Progression Outcomes Goal: Initial discharge plan identified Outcome: Completed/Met Date Met:  12/10/14 Pt plans to go to [facility] on [address] at discharge.

## 2014-12-10 NOTE — Transfer of Care (Signed)
Immediate Anesthesia Transfer of Care Note  Patient: Sharon Lowe  Procedure(s) Performed: Procedure(s): TOTAL KNEE ARTHROPLASTY (Right)  Patient Location: PACU  Anesthesia Type:MAC and Spinal  Level of Consciousness: awake, alert  and oriented  Airway & Oxygen Therapy: Patient Spontanous Breathing and Patient connected to face mask oxygen  Post-op Assessment: Report given to RN and Post -op Vital signs reviewed and stable  Post vital signs: Reviewed and stable  Last Vitals:  Filed Vitals:   12/10/14 1128  BP: 172/69  Pulse:   Temp:   Resp:     Complications: No apparent anesthesia complications

## 2014-12-10 NOTE — Op Note (Signed)
NAME:  Sharon Lowe                      MEDICAL RECORD NO.:  [id number]                             FACILITY:  [facility]      PHYSICIAN:  Pietro Cassis. Alvan Dame, M.D.  DATE OF BIRTH:  [birth year]      DATE OF PROCEDURE:  12/10/2014                                     OPERATIVE REPORT         PREOPERATIVE DIAGNOSIS:  Right knee osteoarthritis.      POSTOPERATIVE DIAGNOSIS:  Right knee osteoarthritis.      FINDINGS:  The patient was noted to have complete loss of cartilage and   bone-on-bone arthritis with associated osteophytes in all three compartments of   the knee with a significant synovitis and associated effusion.      PROCEDURE:  Right total knee replacement.      COMPONENTS USED:  DePuy Attune rotating platform posterior stabilized knee   system, a size 6 femur, 6 tibia, 7 mm PS AOX insert, and 41 patellar   button.      SURGEON:  Pietro Cassis. Alvan Dame, M.D.      ASSISTANT:  Danae Orleans, PA-C.      ANESTHESIA:  Spinal.      SPECIMENS:  None.      COMPLICATION:  None.      DRAINS:  None.  EBL: <50cc      TOURNIQUET TIME:   Total Tourniquet Time Documented: Thigh (Right) - 35 minutes Total: Thigh (Right) - 35 minutes  .      The patient was stable to the recovery room.      INDICATION FOR PROCEDURE:  Sharon Lowe is a 69 y.o. female patient of   mine.  The patient had been seen, evaluated, and treated conservatively in the   office with medication, activity modification, and injections.  The patient had   radiographic changes of bone-on-bone arthritis with endplate sclerosis and osteophytes noted.      The patient failed conservative measures including medication, injections, and activity modification, and at this point was ready for more definitive measures.   Based on the radiographic changes and failed conservative measures, the patient   decided to proceed with total knee replacement.  Risks of infection,   DVT, component failure, need for revision surgery, postop  course, and   expectations were all   discussed and reviewed.  Consent was obtained for benefit of pain   relief.      PROCEDURE IN DETAIL:  The patient was brought to the operative theater.   Once adequate anesthesia, preoperative antibiotics, 2 gm of Ancef and 10mg  of Decadron administered, the patient was positioned supine with the right thigh tourniquet placed.  The  right lower extremity was prepped and draped in sterile fashion.  A time-   out was performed identifying the patient, planned procedure, and   extremity.      The right lower extremity was placed in the [facility] leg holder.  The leg was   exsanguinated, tourniquet elevated to 250 mmHg.  A midline incision was   made followed by median parapatellar arthrotomy.  Following initial  exposure, attention was first directed to the patella.  Precut   measurement was noted to be 26 mm.  I resected down to 14-15 mm and used a   41 patellar button to restore patellar height as well as cover the cut   surface.      The lug holes were drilled and a metal shim was placed to protect the   patella from retractors and saw blades.      At this point, attention was now directed to the femur.  The femoral   canal was opened with a drill, irrigated to try to prevent fat emboli.  An   intramedullary rod was passed at 3 degrees valgus, 9 mm of bone was   resected off the distal femur.  Following this resection, the tibia was   subluxated anteriorly.  Using the extramedullary guide, 2 mm of bone was resected off   the proximal medial tibia.  We confirmed the gap would be   stable medially and laterally with a 5 mm insert as well as confirmed   the cut was perpendicular in the coronal plane, checking with an alignment rod.      Once this was done, I sized the femur to be a size 6 in the anterior-   posterior dimension, chose a standard component based on medial and   lateral dimension.  The size 6 rotation block was then pinned in    position anterior referenced using the C-clamp to set rotation.  The   anterior, posterior, and  chamfer cuts were made without difficulty nor   notching making certain that I was along the anterior cortex to help   with flexion gap stability.      The final box cut was made off the lateral aspect of distal femur.      At this point, the tibia was sized to be a size 6, the size 6 tray was   then pinned in position through the medial third of the tubercle,   drilled, and keel punched.  Trial reduction was now carried with a 6 femur,  6 tibia, a size 7 mm PS insert, and the 41 patella botton.  The knee was brought to   extension, full extension with good flexion stability with the patella   tracking through the trochlea without application of pressure.  Given   all these findings, the trial components removed.  Final components were   opened and cement was mixed.  The knee was irrigated with normal saline   solution and pulse lavage.  The synovial lining was   then injected with 30cc of 0.25% Marcaine with epinephrine and 1 cc of Toradol plus 30cc of NS for a   total of 61 cc.      The knee was irrigated.  Final implants were then cemented onto clean and   dried cut surfaces of bone with the knee brought to extension with a 7 mm trial insert.      Once the cement had fully cured, the excess cement was removed   throughout the knee.  I confirmed I was satisfied with the range of   motion and stability, and the final size 7 mm PS AOX insert was chosen.  It was   placed into the knee.      The tourniquet had been let down at 35 minutes.  No significant   hemostasis required.  The   extensor mechanism was then reapproximated using #1 Vicryl and #0 V-lock  sutures with the knee   in flexion.  The   remaining wound was closed with 2-0 Vicryl and running 4-0 Monocryl.   The knee was cleaned, dried, dressed sterilely using Dermabond and   Aquacel dressing.  The patient was then   brought to  recovery room in stable condition, tolerating the procedure   well.   Please note that Physician Assistant, Danae Orleans, PA-C, was present for the entirety of the case, and was utilized for pre-operative positioning, peri-operative retractor management, general facilitation of the procedure.  He was also utilized for primary wound closure at the end of the case.              Pietro Cassis Alvan Dame, M.D.    12/10/2014 3:10 PM

## 2014-12-10 NOTE — Interval H&P Note (Signed)
History and Physical Interval Note:  12/10/2014 12:39 PM  Sharon Lowe  has presented today for surgery, with the diagnosis of OA right knee  The various methods of treatment have been discussed with the patient and family. After consideration of risks, benefits and other options for treatment, the patient has consented to  Procedure(s): TOTAL KNEE ARTHROPLASTY (Right) as a surgical intervention .  The patient's history has been reviewed, patient examined, no change in status, stable for surgery.  I have reviewed the patient's chart and labs.  Questions were answered to the patient's satisfaction.     Mauri Pole

## 2014-12-10 NOTE — Anesthesia Procedure Notes (Signed)
Spinal Patient location during procedure: OR Start time: 12/10/2014 1:30 PM End time: 12/10/2014 1:37 PM Staffing Anesthesiologist: HODIERNE, ADAM Performed by: anesthesiologist  Preanesthetic Checklist Completed: patient identified, site marked, surgical consent, pre-op evaluation, timeout performed, IV checked, risks and benefits discussed and monitors and equipment checked Spinal Block Patient position: sitting Prep: Betadine and site prepped and draped Patient monitoring: cardiac monitor, heart rate, continuous pulse ox and blood pressure Approach: midline Location: L3-4 Injection technique: single-shot Needle Needle type: Pencan  Needle gauge: 24 G Needle length: 9 cm Assessment Sensory level: T6 Additional Notes Pt tolerated the procedure well.

## 2014-12-10 NOTE — Progress Notes (Signed)
Utilization review completed.  

## 2014-12-10 NOTE — Anesthesia Preprocedure Evaluation (Signed)
Anesthesia Evaluation  Patient identified by MRN, date of birth, ID band Patient awake    Reviewed: Allergy & Precautions, NPO status , Patient's Chart, lab work & pertinent test results  Airway Mallampati: II   Neck ROM: full    Dental   Pulmonary Current Smoker,  breath sounds clear to auscultation        Cardiovascular hypertension, Rhythm:regular Rate:Normal     Neuro/Psych PSYCHIATRIC DISORDERS Depression    GI/Hepatic GERD-  ,  Endo/Other  diabetes, Type 2Hypothyroidism   Renal/GU      Musculoskeletal  (+) Arthritis -,   Abdominal   Peds  Hematology   Anesthesia Other Findings   Reproductive/Obstetrics                             Anesthesia Physical Anesthesia Plan  ASA: III  Anesthesia Plan: MAC and Spinal   Post-op Pain Management:    Induction: Intravenous  Airway Management Planned: Simple Face Mask  Additional Equipment:   Intra-op Plan:   Post-operative Plan:   Informed Consent: I have reviewed the patients History and Physical, chart, labs and discussed the procedure including the risks, benefits and alternatives for the proposed anesthesia with the patient or authorized representative who has indicated his/her understanding and acceptance.     Plan Discussed with: CRNA, Anesthesiologist and Surgeon  Anesthesia Plan Comments:         Anesthesia Quick Evaluation

## 2014-12-11 ENCOUNTER — Encounter (HOSPITAL_COMMUNITY): Payer: Self-pay | Admitting: Orthopedic Surgery

## 2014-12-11 LAB — CBC
HCT: 25.8 % — ABNORMAL LOW (ref 36.0–46.0)
HEMOGLOBIN: 8.7 g/dL — AB (ref 12.0–15.0)
MCH: 31.9 pg (ref 26.0–34.0)
MCHC: 33.7 g/dL (ref 30.0–36.0)
MCV: 94.5 fL (ref 78.0–100.0)
Platelets: 258 10*3/uL (ref 150–400)
RBC: 2.73 MIL/uL — AB (ref 3.87–5.11)
RDW: 14.1 % (ref 11.5–15.5)
WBC: 13.6 10*3/uL — ABNORMAL HIGH (ref 4.0–10.5)

## 2014-12-11 LAB — BASIC METABOLIC PANEL
Anion gap: 8 (ref 5–15)
BUN: 19 mg/dL (ref 6–20)
CALCIUM: 8 mg/dL — AB (ref 8.9–10.3)
CO2: 26 mmol/L (ref 22–32)
Chloride: 101 mmol/L (ref 101–111)
Creatinine, Ser: 0.91 mg/dL (ref 0.44–1.00)
GFR calc Af Amer: >60 mL/min (ref >60)
GFR calc non Af Amer: >60 mL/min (ref >60)
GLUCOSE: 147 mg/dL — AB (ref 65–99)
POTASSIUM: 5 mmol/L (ref 3.5–5.1)
Sodium: 135 mmol/L (ref 135–145)

## 2014-12-11 LAB — GLUCOSE, CAPILLARY
GLUCOSE-CAPILLARY: 138 mg/dL — AB (ref 65–99)
Glucose-Capillary: 185 mg/dL — ABNORMAL HIGH (ref 65–99)
Glucose-Capillary: 240 mg/dL — ABNORMAL HIGH (ref 65–99)
Glucose-Capillary: 227 mg/dL — ABNORMAL HIGH (ref 65–99)

## 2014-12-11 NOTE — Clinical Social Work Placement (Signed)
   CLINICAL SOCIAL WORK PLACEMENT  NOTE  Date:  12/11/2014  Patient Details  Name: Sharon Lowe MRN: [id number] Date of Birth: 01-Jun-1946  Clinical Social Work is seeking post-discharge placement for this patient at the [place] level of care (*CSW will initial, date and re-position this form in  chart as items are completed):  No   Patient/family provided with [place] Work Department's list of facilities offering this level of care within the geographic area requested by the patient (or if unable, by the patient's family).  Yes   Patient/family informed of their freedom to choose among providers that offer the needed level of care, that participate in Medicare, Medicaid or managed care program needed by the patient, have an available bed and are willing to accept the patient.  No   Patient/family informed of [facility]'s ownership interest in [facility] and [facility], as well as of the fact that they are under no obligation to receive care at these facilities.  PASRR submitted to EDS on       PASRR number received on       Existing PASRR number confirmed on 12/11/14     FL2 transmitted to all facilities in geographic area requested by pt/family on 12/11/14     FL2 transmitted to all facilities within larger geographic area on       Patient informed that his/her managed care company has contracts with or will negotiate with certain facilities, including the following:        Yes   Patient/family informed of bed offers received.  Patient chooses bed at [place], [place]     Physician recommends and patient chooses bed at      Patient to be transferred to [name] [place] on  .  Patient to be transferred to facility by       Patient family notified on   of transfer.  Name of family member notified:        PHYSICIAN       Additional Comment:    _______________________________________________ [name], LCSW 12/11/2014, 12:05 PM

## 2014-12-11 NOTE — Care Management Note (Signed)
Case Management Note  Patient Details  Name: Sharon Lowe MRN: [id number] Date of Birth: 19-Jun-1945  Subjective/Objective:                   TOTAL KNEE ARTHROPLASTY (Right) Action/Plan:  Discharge planning Expected Discharge Date:  12/12/14               Expected Discharge Plan:  [place]  In-House Referral:     Discharge planning Services  CM Consult  Post Acute Care Choice:    Choice offered to:     DME Arranged:    DME Agency:     HH Arranged:    [place] Agency:     Status of Service:  Completed, signed off  Medicare Important Message Given:    Date Medicare IM Given:    Medicare IM give by:    Date Additional Medicare IM Given:    Additional Medicare Important Message give by:     If discussed at [place] of Stay Meetings, dates discussed:    Additional Comments: CM notes pt to go to SNF; CSW arranging.  No other CM needs were communicated. Dellie Catholic, RN 12/11/2014, 10:15 AM

## 2014-12-11 NOTE — Progress Notes (Signed)
Physical Therapy Treatment Note    12/11/14 1500  PT Visit Information  Last PT Received On 12/11/14  Assistance Needed +1  History of Present Illness Pt is a 69 year old female s/p R TKA with hx of L TKA revision.  PT Time Calculation  PT Start Time (ACUTE ONLY) 1425  PT Stop Time (ACUTE ONLY) 1445  PT Time Calculation (min) (ACUTE ONLY) 20 min  Subjective Data  Subjective Pt ambulated again in hallway and assisted back to bed.  Pt felt too fatigued and reports increased pain therefore declined exercises today.  Precautions  Precautions Knee  Restrictions  Other Position/Activity Restrictions WBAT  Pain Assessment  Pain Assessment 0-10  Pain Score 5  Pain Location R knee and thigh  Pain Descriptors / Indicators Aching;Sore  Pain Intervention(s) Limited activity within patient's tolerance;Monitored during session;RN gave pain meds during session;Ice applied  Cognition  Arousal/Alertness Awake/alert  Behavior During Therapy WFL for tasks assessed/performed  Overall Cognitive Status Within Functional Limits for tasks assessed  Bed Mobility  Overal bed mobility Needs Assistance  Bed Mobility Sit to Supine  Sit to supine Supervision  Transfers  Overall transfer level Needs assistance  Equipment used Rolling walker (2 wheeled)  Transfers Sit to/from Stand  Sit to Stand Min guard  General transfer comment verbal cues for safe technique including UE and LE postiioning  Ambulation/Gait  Ambulation/Gait assistance Min guard  Ambulation Distance (Feet) 120 Feet  Assistive device Rolling walker (2 wheeled)  Gait Pattern/deviations Step-to pattern;Antalgic  General Gait Details verbal cues for sequence, RW distance, step length, distance to pt tolerance  PT - End of Session  Activity Tolerance Patient limited by pain  Patient left in bed;with call bell/phone within reach  PT - Assessment/Plan  PT Plan Current plan remains appropriate  PT Frequency (ACUTE ONLY) 7X/week  Follow Up  Recommendations SNF  PT equipment None recommended by PT  PT Goal Progression  Progress towards PT goals Progressing toward goals  PT General Charges  $$ ACUTE PT VISIT 1 Procedure  PT Treatments  $Gait Training 8-22 mins   Carmelia Bake, PT, DPT 12/11/2014 Pager: [phone]

## 2014-12-11 NOTE — Progress Notes (Signed)
OT Cancellation Note  Patient Details Name: Sharon Lowe MRN: [id number] DOB: [birth year]   Cancelled Treatment:    Reason Eval/Treat Not Completed: Other (comment) Defer OT eval to SNF.   [name], [name] 12/11/2014, 12:28 PM

## 2014-12-11 NOTE — Evaluation (Signed)
Physical Therapy Evaluation Patient Details Name: GLESSIE EUSTICE MRN: [id number] DOB: 05-31-1946 Today's Date: 12/11/2014   History of Present Illness  Pt is a 69 year old female s/p R TKA with hx of L TKA revision.  Clinical Impression  Pt is s/p R TKA resulting in the deficits listed below (see PT Problem List).  Pt will benefit from skilled PT to increase their independence and safety with mobility to allow discharge to the venue listed below.  Pt plans to d/c to SNF.     Follow Up Recommendations SNF    Equipment Recommendations  None recommended by PT    Recommendations for Other Services       Precautions / Restrictions Precautions Precautions: Knee Restrictions Other Position/Activity Restrictions: WBAT      Mobility  Bed Mobility Overal bed mobility: Needs Assistance Bed Mobility: Supine to Sit     Supine to sit: Supervision;HOB elevated     General bed mobility comments: verbal cues for technique  Transfers Overall transfer level: Needs assistance Equipment used: Rolling walker (2 wheeled) Transfers: Sit to/from Stand Sit to Stand: Min guard         General transfer comment: verbal cues for safe technique including UE and LE postiioning  Ambulation/Gait Ambulation/Gait assistance: Min guard Ambulation Distance (Feet): 40 Feet Assistive device: Rolling walker (2 wheeled) Gait Pattern/deviations: Step-to pattern;Antalgic     General Gait Details: verbal cues for sequence, RW distance, step length, distance limited by pain  Stairs            Wheelchair Mobility    Modified Rankin (Stroke Patients Only)       Balance                                             Pertinent Vitals/Pain Pain Assessment: 0-10 Pain Score: 2  Pain Location: R knee Pain Intervention(s): Monitored during session;Repositioned;Ice applied;Limited activity within patient's tolerance    Home Living Family/patient expects to be discharged to::  Skilled nursing facility ([facility]) Living Arrangements: Alone                    Prior Function Level of Independence: Independent with assistive device(s)               Hand Dominance        Extremity/Trunk Assessment               Lower Extremity Assessment: RLE deficits/detail RLE Deficits / Details: fair quad contraction, functionally observed at least 60* active knee flexion during transfers       Communication   Communication: No difficulties  Cognition Arousal/Alertness: Awake/alert Behavior During Therapy: WFL for tasks assessed/performed Overall Cognitive Status: Within Functional Limits for tasks assessed                      General Comments      Exercises        Assessment/Plan    PT Assessment Patient needs continued PT services  PT Diagnosis Difficulty walking;Acute pain   PT Problem List Decreased strength;Decreased range of motion;Decreased mobility;Pain  PT Treatment Interventions Functional mobility training;Gait training;DME instruction;Patient/family education;Therapeutic activities;Therapeutic exercise   PT Goals (Current goals can be found in the Care Plan section) Acute Rehab PT Goals PT Goal Formulation: With patient Time For Goal Achievement: 12/15/14 Potential to Achieve Goals:  Good    Frequency 7X/week   Barriers to discharge        Co-evaluation               End of Session Equipment Utilized During Treatment: Gait belt Activity Tolerance: Patient limited by pain Patient left: in chair;with call bell/phone within reach;with nursing/sitter in room           Time: 4401-0272 PT Time Calculation (min) (ACUTE ONLY): 12 min   Charges:   PT Evaluation $Initial PT Evaluation Tier I: 1 Procedure     PT G Codes:        Mary Hockey,KATHrine E 12/11/2014, 10:29 AM Carmelia Bake, PT, DPT 12/11/2014 Pager: [phone]

## 2014-12-11 NOTE — Progress Notes (Signed)
Patient ID: Sharon Lowe, female   DOB: [birth year], 69 y.o.   MRN: [id number] Subjective: 1 Day Post-Op Procedure(s) (LRB): TOTAL KNEE ARTHROPLASTY (Right)    Patient reports pain as moderate.  Doing well despite some early am pain.  Up eating breakfast this am  Objective:   VITALS:   Filed Vitals:   12/11/14 0629  BP: 146/65  Pulse: 55  Temp: 98.5 F (36.9 C)  Resp: 16    Neurovascular intact Incision: dressing C/D/I  LABS  Recent Labs  12/11/14 0430  HGB 8.7*  HCT 25.8*  WBC 13.6*  PLT 258     Recent Labs  12/11/14 0430  NA 135  K 5.0  BUN 19  CREATININE 0.91  GLUCOSE 147*    No results for input(s): LABPT, INR in the last 72 hours.   Assessment/Plan: 1 Day Post-Op Procedure(s) (LRB): TOTAL KNEE ARTHROPLASTY (Right)   Up with therapy Discharge to SNF probably tomorrow

## 2014-12-11 NOTE — Clinical Social Work Note (Signed)
Clinical Social Work Assessment  Patient Details  Name: Sharon Lowe MRN: [id number] Date of Birth: Nov 08, 1945  Date of referral:  12/11/14               Reason for consult:  Facility Placement, Discharge Planning                Permission sought to share information with:    Permission granted to share information::     Name::        Agency::     Relationship::     Contact Information:     Housing/Transportation Living arrangements for the past 2 months:  Single Family Home Source of Information:  Patient Patient Interpreter Needed:  None Criminal Activity/Legal Involvement Pertinent to Current Situation/Hospitalization:  No - Comment as needed Significant Relationships:  Adult Children Lives with:  Self Do you feel safe going back to the place where you live?   (Rehab placement needed.) Need for family participation in patient care:  No (Coment)  Care giving concerns:  Pt's care cannot be managed at home following hospital d/c.   Social Worker assessment / plan: Pt hospitalized on 12/11/14 for pre planned right total knee arthroplasty. CSW met with pt to assist with d/c planning. Pt has made prior arrangements to have ST Rehab at [place] [facility] ( [facility] ) at d/c. CSW has contacted SNF and d/c plans have been confirmed. CSW will assist with d/c planning to SNF.   Employment status:  Retired Forensic scientist:  Medicare PT Recommendations:  [place] / Referral to community resources:  [place]  Patient/Family's Response to care:  Pt feels ST Rehab is needed.  Patient/Family's Understanding of and Emotional Response to Diagnosis, Current Treatment, and Prognosis:  Pt has been to [facility] in the past for rehab. She is motivated to work with therapy and is looking forward to having rehab, again, at [facility].  Emotional Assessment Appearance:  Appears stated age Attitude/Demeanor/Rapport:   (cooperative) Affect (typically observed):  Calm,  Pleasant Orientation:  Oriented to Self, Oriented to Place, Oriented to  Time, Oriented to Situation Alcohol / Substance use:  Not Applicable Psych involvement (Current and /or in the community):  No (Comment)  Discharge Needs  Concerns to be addressed:  Discharge Planning Concerns Readmission within the last 30 days:  No Current discharge risk:  None Barriers to Discharge:  No Barriers Identified   Marien Manship, Randall An, LCSW 12/11/2014, 11:59 AM

## 2014-12-12 DIAGNOSIS — Z96651 Presence of right artificial knee joint: Secondary | ICD-10-CM | POA: Diagnosis not present

## 2014-12-12 DIAGNOSIS — D508 Other iron deficiency anemias: Secondary | ICD-10-CM | POA: Diagnosis not present

## 2014-12-12 DIAGNOSIS — E669 Obesity, unspecified: Secondary | ICD-10-CM | POA: Diagnosis present

## 2014-12-12 DIAGNOSIS — E119 Type 2 diabetes mellitus without complications: Secondary | ICD-10-CM | POA: Diagnosis not present

## 2014-12-12 DIAGNOSIS — K219 Gastro-esophageal reflux disease without esophagitis: Secondary | ICD-10-CM | POA: Diagnosis not present

## 2014-12-12 DIAGNOSIS — F329 Major depressive disorder, single episode, unspecified: Secondary | ICD-10-CM | POA: Diagnosis not present

## 2014-12-12 DIAGNOSIS — E039 Hypothyroidism, unspecified: Secondary | ICD-10-CM | POA: Diagnosis not present

## 2014-12-12 DIAGNOSIS — E785 Hyperlipidemia, unspecified: Secondary | ICD-10-CM | POA: Diagnosis not present

## 2014-12-12 DIAGNOSIS — I10 Essential (primary) hypertension: Secondary | ICD-10-CM | POA: Diagnosis not present

## 2014-12-12 DIAGNOSIS — I82419 Acute embolism and thrombosis of unspecified femoral vein: Secondary | ICD-10-CM | POA: Diagnosis not present

## 2014-12-12 DIAGNOSIS — R011 Cardiac murmur, unspecified: Secondary | ICD-10-CM | POA: Diagnosis not present

## 2014-12-12 LAB — GLUCOSE, CAPILLARY
GLUCOSE-CAPILLARY: 141 mg/dL — AB (ref 65–99)
Glucose-Capillary: 154 mg/dL — ABNORMAL HIGH (ref 65–99)

## 2014-12-12 LAB — BASIC METABOLIC PANEL
Anion gap: 11 (ref 5–15)
BUN: 23 mg/dL — ABNORMAL HIGH (ref 6–20)
CO2: 25 mmol/L (ref 22–32)
Calcium: 8.8 mg/dL — ABNORMAL LOW (ref 8.9–10.3)
Chloride: 101 mmol/L (ref 101–111)
Creatinine, Ser: 1.1 mg/dL — ABNORMAL HIGH (ref 0.44–1.00)
GFR calc Af Amer: 58 mL/min — ABNORMAL LOW (ref >60)
GFR calc non Af Amer: 50 mL/min — ABNORMAL LOW (ref >60)
Glucose, Bld: 158 mg/dL — ABNORMAL HIGH (ref 65–99)
POTASSIUM: 4.4 mmol/L (ref 3.5–5.1)
SODIUM: 137 mmol/L (ref 135–145)

## 2014-12-12 LAB — CBC
HCT: 25.8 % — ABNORMAL LOW (ref 36.0–46.0)
Hemoglobin: 8.7 g/dL — ABNORMAL LOW (ref 12.0–15.0)
MCH: 32.6 pg (ref 26.0–34.0)
MCHC: 33.7 g/dL (ref 30.0–36.0)
MCV: 96.6 fL (ref 78.0–100.0)
Platelets: 293 10*3/uL (ref 150–400)
RBC: 2.67 MIL/uL — AB (ref 3.87–5.11)
RDW: 14.5 % (ref 11.5–15.5)
WBC: 13.4 10*3/uL — AB (ref 4.0–10.5)

## 2014-12-12 MED ORDER — ASPIRIN 325 MG PO TBEC: 325.0000 mg | DELAYED_RELEASE_TABLET | Freq: Two times a day (BID) | ORAL | Status: AC

## 2014-12-12 MED ORDER — DOCUSATE SODIUM 100 MG PO CAPS
100.0000 mg | ORAL_CAPSULE | Freq: Two times a day (BID) | ORAL | Status: DC
Start: 2014-12-12 — End: 2024-03-23

## 2014-12-12 MED ORDER — HYDROCODONE-ACETAMINOPHEN 7.5-325 MG PO TABS: 1.0000 | ORAL_TABLET | ORAL | Status: DC | PRN

## 2014-12-12 MED ORDER — POLYETHYLENE GLYCOL 3350 17 G PO PACK: 17.0000 g | PACK | Freq: Two times a day (BID) | ORAL | Status: AC

## 2014-12-12 MED ORDER — TIZANIDINE HCL 4 MG PO TABS: 4.0000 mg | ORAL_TABLET | Freq: Four times a day (QID) | ORAL | Status: DC | PRN

## 2014-12-12 MED ORDER — FERROUS SULFATE 325 (65 FE) MG PO TABS: 325.0000 mg | ORAL_TABLET | Freq: Three times a day (TID) | ORAL | Status: AC

## 2014-12-12 NOTE — Clinical Social Work Placement (Signed)
   CLINICAL SOCIAL WORK PLACEMENT  NOTE  Date:  12/12/2014  Patient Details  Name: Sharon Lowe MRN: [id number] Date of Birth: 1946/02/28  Clinical Social Work is seeking post-discharge placement for this patient at the [place] level of care (*CSW will initial, date and re-position this form in  chart as items are completed):  No   Patient/family provided with [place] Work Department's list of facilities offering this level of care within the geographic area requested by the patient (or if unable, by the patient's family).  Yes   Patient/family informed of their freedom to choose among providers that offer the needed level of care, that participate in Medicare, Medicaid or managed care program needed by the patient, have an available bed and are willing to accept the patient.  No   Patient/family informed of [facility]'s ownership interest in [facility] and [facility], as well as of the fact that they are under no obligation to receive care at these facilities.  PASRR submitted to EDS on       PASRR number received on       Existing PASRR number confirmed on 12/11/14     FL2 transmitted to all facilities in geographic area requested by pt/family on 12/11/14     FL2 transmitted to all facilities within larger geographic area on       Patient informed that his/her managed care company has contracts with or will negotiate with certain facilities, including the following:        Yes   Patient/family informed of bed offers received.  Patient chooses bed at [place], [place]     Physician recommends and patient chooses bed at      Patient to be transferred to [place], [place] on 12/12/14.  Patient to be transferred to facility by [place]     Patient family notified on 12/12/14 of transfer.  Name of family member notified:  DAUGHTER     PHYSICIAN       Additional Comment: Pt /daughter are in agreement with d/c to SNF  today. PT approved transport by car. NSG reviewed d/c summary, scripts, avs. Scripts included in d/c packet. D/C packet provided to pt prior to d/c.  Werner Lean LCSW [phone]   _______________________________________________ [name], LCSW 12/12/2014, 4:29 PM

## 2014-12-12 NOTE — Progress Notes (Signed)
     Subjective: 2 Days Post-Op Procedure(s) (LRB): TOTAL KNEE ARTHROPLASTY (Right)   Patient reports pain as mild, pain controlled. No events throughout the night. Little rough day yesterday, but better today.  Ready to be discharged home.   Objective:   VITALS:   Filed Vitals:   12/12/14 0512  BP: 150/73  Pulse: 63  Temp: 98.3 F (36.8 C)  Resp: 16    Dorsiflexion/Plantar flexion intact Incision: dressing C/D/I No cellulitis present Compartment soft  LABS  Recent Labs  12/11/14 0430 12/12/14 0425  HGB 8.7* 8.7*  HCT 25.8* 25.8*  WBC 13.6* 13.4*  PLT 258 293     Recent Labs  12/11/14 0430 12/12/14 0425  NA 135 137  K 5.0 4.4  BUN 19 23*  CREATININE 0.91 1.10*  GLUCOSE 147* 158*     Assessment/Plan: 2 Days Post-Op Procedure(s) (LRB): TOTAL KNEE ARTHROPLASTY (Right) Up with therapy Discharge to SNF  Follow up in 2 weeks at [facility]. Follow up with OLIN,Noorah Giammona D in 2 weeks.  Contact information:  [facility] [address] [place] [phone]    Obese (BMI 30-39.9) Estimated body mass index is 30.38 kg/(m^2) as calculated from the following:   Height as of this encounter: 5\' 7"  (1.702 m).   Weight as of this encounter: 87.998 kg (194 lb). Patient also counseled that weight may inhibit the healing process Patient counseled that losing weight will help with future health issues      West Pugh. Berneda Piccininni   PAC  12/12/2014, 9:56 AM

## 2014-12-12 NOTE — Progress Notes (Signed)
Report called to nurse Inez Catalina at [facility].

## 2014-12-12 NOTE — Plan of Care (Signed)
Problem: Consults Goal: Diagnosis- Total Joint Replacement Outcome: Completed/Met Date Met:  12/12/14 Primary Total Knee RIGHT  Problem: Phase III Progression Outcomes Goal: Anticoagulant follow-up in place Outcome: Not Applicable Date Met:  03/90/56 ASA for VTE, no f/u needed.

## 2014-12-12 NOTE — Care Management Important Message (Signed)
Important Message  Patient Details  Name: Sharon Lowe MRN: [id number] Date of Birth: 05/30/1946   Medicare Important Message Given:  Yes-second notification given    Camillo Flaming 12/12/2014, 11:05 AMImportant Message  Patient Details  Name: Sharon Lowe MRN: [id number] Date of Birth: 1945/06/03   Medicare Important Message Given:  Yes-second notification given    Camillo Flaming 12/12/2014, 11:05 AM

## 2014-12-12 NOTE — Progress Notes (Signed)
Physical Therapy Treatment Patient Details Name: Sharon Lowe MRN: [id number] DOB: [birth year] Today's Date: January 10, 2015    History of Present Illness Pt is a 69 year old female s/p R TKA with hx of L TKA revision.    PT Comments    Pt ambulated in hallway and assisted back to bed.  Pt performed LE exercises.  Pt to d/c to SNF today.  Follow Up Recommendations  SNF     Equipment Recommendations  None recommended by PT    Recommendations for Other Services       Precautions / Restrictions Precautions Precautions: Knee Restrictions Weight Bearing Restrictions: No Other Position/Activity Restrictions: WBAT    Mobility  Bed Mobility Overal bed mobility: Needs Assistance Bed Mobility: Sit to Supine       Sit to supine: Supervision   General bed mobility comments: verbal cues for self assist  Transfers Overall transfer level: Needs assistance Equipment used: Rolling walker (2 wheeled) Transfers: Sit to/from Stand Sit to Stand: Min guard         General transfer comment: verbal cues for safe technique including UE and LE postiioning  Ambulation/Gait Ambulation/Gait assistance: Min guard Ambulation Distance (Feet): 120 Feet Assistive device: Rolling walker (2 wheeled) Gait Pattern/deviations: Step-to pattern;Antalgic     General Gait Details: verbal cues for  RW distance, step length, heel strike   Stairs            Wheelchair Mobility    Modified Rankin (Stroke Patients Only)       Balance                                    Cognition Arousal/Alertness: Awake/alert Behavior During Therapy: WFL for tasks assessed/performed Overall Cognitive Status: Within Functional Limits for tasks assessed                      Exercises Total Joint Exercises Ankle Circles/Pumps: AROM;Both;10 reps Quad Sets: AROM;Both;10 reps Towel Squeeze: AROM;Both;10 reps Short Arc QuadSinclair Ship;Right;10 reps Heel Slides: AAROM;Right;10 reps Hip  ABduction/ADduction: AROM;Right;10 reps Straight Leg Raises: AAROM;Right;10 reps    General Comments        Pertinent Vitals/Pain Pain Assessment: 0-10 Pain Score: 4  Pain Location: R knee Pain Descriptors / Indicators: Aching;Sore Pain Intervention(s): Limited activity within patient's tolerance;Monitored during session;Repositioned;Ice applied    Home Living                      Prior Function            PT Goals (current goals can now be found in the care plan section) Progress towards PT goals: Progressing toward goals    Frequency  7X/week    PT Plan Current plan remains appropriate    Co-evaluation             End of Session   Activity Tolerance: Patient tolerated treatment well Patient left: in bed;with call bell/phone within reach     Time: 0927-0946 PT Time Calculation (min) (ACUTE ONLY): 19 min  Charges:  $Therapeutic Exercise: 8-22 mins                    G Codes:      Topanga Alvelo,KATHrine E 01/10/15, 12:31 PM Carmelia Bake, PT, DPT January 10, 2015 Pager: [phone]

## 2014-12-12 NOTE — Discharge Summary (Signed)
Physician Discharge Summary  Patient ID: Sharon Lowe MRN: [id number] DOB/AGE: 1946/01/03 69 y.o.  Admit date: 12/10/2014 Discharge date:  12/12/2014  Procedures:  Procedure(s) (LRB): TOTAL KNEE ARTHROPLASTY (Right)  Attending Physician:  Dr. Paralee Cancel   Admission Diagnoses:   Right knee primary OA / pain  Discharge Diagnoses:  Principal Problem:   S/P right TKA Active Problems:   S/P knee replacement   Obese  Past Medical History  Diagnosis Date  . Hypertension   . Hyperlipidemia   . GERD (gastroesophageal reflux disease)   . Hypothyroidism   . Arthritis   . History of TIA (transient ischemic attack)     ~2000-- no residual  . Type 2 diabetes mellitus   . Bilateral ureteral calculi   . History of kidney stones 2015  . Urgency of urination   . Wears contact lenses   . Heart murmur     asymptomatic  . History of sleep apnea     hx of prior to gastric bypass, no problems since  . Pneumonia     hx of as a child  . Depression     mild    HPI:    Sharon Lowe, 69 y.o. female, has a history of pain and functional disability in the right knee due to arthritis and has failed non-surgical conservative treatments for greater than 12 weeks to include NSAID's and/or analgesics, corticosteriod injections, use of assistive devices and activity modification. Onset of symptoms was gradual, starting 5+ years ago with gradually worsening course since that time. The patient noted prior procedures on the knee to include arthroplasty on the left knee(s). Patient currently rates pain in the right knee(s) at 8 out of 10 with activity. Patient has night pain, worsening of pain with activity and weight bearing, pain that interferes with activities of daily living, pain with passive range of motion, crepitus and joint swelling. Patient has evidence of periarticular osteophytes and joint space narrowing by imaging studies. There is no active infection. Risks, benefits and expectations  were discussed with the patient. Risks including but not limited to the risk of anesthesia, blood clots, nerve damage, blood vessel damage, failure of the prosthesis, infection and up to and including death. Patient understand the risks, benefits and expectations and wishes to proceed with surgery.   PCP: Mathews Argyle, MD   Discharged Condition: good  Hospital Course:  Patient underwent the above stated procedure on 12/10/2014. Patient tolerated the procedure well and brought to the recovery room in good condition and subsequently to the floor.  POD #1 BP: 146/65 ; Pulse: 55 ; Temp: 98.5 F (36.9 C) ; Resp: 16 Patient reports pain as moderate. Doing well despite some early am pain. Up eating breakfast this am. Dorsiflexion/plantar flexion intact, incision: dressing C/D/I, no cellulitis present and compartment soft.   LABS  Basename    HGB  8.7  HCT  25.8   POD #2  BP: 150/73 ; Pulse: 63 ; Temp: 98.3 F (36.8 C) ; Resp: 16 Patient reports pain as mild, pain controlled. No events throughout the night. Little rough day yesterday, but better today. Ready to be discharged home.  Dorsiflexion/plantar flexion intact, incision: dressing C/D/I, no cellulitis present and compartment soft.   LABS  Basename    HGB  8.7  HCT  25.8    Discharge Exam: General appearance: alert, cooperative and no distress Extremities: Homans sign is negative, no sign of DVT, no edema, redness or tenderness in the calves or  thighs and no ulcers, gangrene or trophic changes  Disposition:   Skilled nursing facility with follow up in 2 weeks   Follow-up Information    Follow up with Mauri Pole, MD. Schedule an appointment as soon as possible for a visit in 2 weeks.   Specialty:  Orthopedic Surgery   Contact information:   [address] [place] [address] [phone]       Discharge Instructions    Call MD / Call 911    Complete by:  As directed   If you experience  chest pain or shortness of breath, CALL 911 and be transported to the hospital emergency room.  If you develope a fever above 101 F, pus (white drainage) or increased drainage or redness at the wound, or calf pain, call your surgeon's office.     Change dressing    Complete by:  As directed   Maintain surgical dressing until follow up in the clinic. If the edges start to pull up, may reinforce with tape. If the dressing is no longer working, may remove and cover with gauze and tape, but must keep the area dry and clean.  Call with any questions or concerns.     Constipation Prevention    Complete by:  As directed   Drink plenty of fluids.  Prune juice may be helpful.  You may use a stool softener, such as Colace (over the counter) 100 mg twice a day.  Use MiraLax (over the counter) for constipation as needed.     Diet - low sodium heart healthy    Complete by:  As directed      Discharge instructions    Complete by:  As directed   Maintain surgical dressing until follow up in the clinic. If the edges start to pull up, may reinforce with tape. If the dressing is no longer working, may remove and cover with gauze and tape, but must keep the area dry and clean.  Follow up in 2 weeks at [facility]. Call with any questions or concerns.     Increase activity slowly as tolerated    Complete by:  As directed      TED hose    Complete by:  As directed   Use stockings (TED hose) for 2 weeks on both leg(s).  You may remove them at night for sleeping.     Weight bearing as tolerated    Complete by:  As directed   Laterality:  right  Extremity:  Lower             Medication List    STOP taking these medications        acetaminophen 500 MG tablet  Commonly known as:  TYLENOL     cephALEXin 500 MG capsule  Commonly known as:  KEFLEX     doxycycline 100 MG capsule  Commonly known as:  VIBRAMYCIN      TAKE these medications        ALLERGY 10 MG tablet  Generic drug:   loratadine  Take 10 mg by mouth every morning.     aspirin 325 MG EC tablet  Take 1 tablet (325 mg total) by mouth 2 (two) times daily.     atorvastatin 10 MG tablet  Commonly known as:  LIPITOR  Take 10 mg by mouth every evening.     buPROPion 100 MG 12 hr tablet  Commonly known as:  WELLBUTRIN SR  Take 100 mg by mouth every morning.  CALCIUM 600+D 600-800 MG-UNIT Tabs  Generic drug:  Calcium Carb-Cholecalciferol  Take 2 tablets by mouth at bedtime.     docusate sodium 100 MG capsule  Commonly known as:  COLACE  Take 1 capsule (100 mg total) by mouth 2 (two) times daily.     ferrous sulfate 325 (65 FE) MG tablet  Take 1 tablet (325 mg total) by mouth 3 (three) times daily after meals.     hydrochlorothiazide 12.5 MG capsule  Commonly known as:  MICROZIDE  Take 12.5 mg by mouth every morning.     HYDROcodone-acetaminophen 7.5-325 MG per tablet  Commonly known as:  NORCO  Take 1-2 tablets by mouth every 4 (four) hours as needed for moderate pain.     levothyroxine 88 MCG tablet  Commonly known as:  SYNTHROID, LEVOTHROID  Take 88 mcg by mouth daily before breakfast.     lisinopril 10 MG tablet  Commonly known as:  PRINIVIL,ZESTRIL  Take 10 mg by mouth every morning.     MAGNESIUM PO  Take 250 mg by mouth every morning.     metFORMIN 500 MG tablet  Commonly known as:  GLUCOPHAGE  Take 500 mg by mouth 2 (two) times daily with a meal.     multivitamin with minerals Tabs tablet  Take 2 tablets by mouth every morning.     Omega-3 Fish Oil 1200 MG Caps  Take 1 capsule by mouth every morning.     omeprazole 20 MG capsule  Commonly known as:  PRILOSEC  Take 20 mg by mouth every morning.     polyethylene glycol packet  Commonly known as:  MIRALAX / GLYCOLAX  Take 17 g by mouth 2 (two) times daily.     SYSTANE OP  Apply 1 drop to eye 3 (three) times daily as needed (dry eyes.).     tiZANidine 4 MG tablet  Commonly known as:  ZANAFLEX  Take 1 tablet (4 mg  total) by mouth every 6 (six) hours as needed for muscle spasms.     VICTOZA 18 MG/3ML Sopn  Generic drug:  Liraglutide  Inject 1.2 mg into the skin at bedtime.     Vitamin B-12 2500 MCG Subl  Place 2,500 mcg under the tongue every morning.     VITAMIN B-6 PO  Take 1 tablet by mouth every morning.         Signed: West Pugh. Kween Bacorn   PA-C  12/12/2014, 10:01 AM

## 2014-12-12 NOTE — Discharge Instructions (Signed)

## 2014-12-16 DIAGNOSIS — I82419 Acute embolism and thrombosis of unspecified femoral vein: Secondary | ICD-10-CM | POA: Diagnosis not present

## 2014-12-16 DIAGNOSIS — I10 Essential (primary) hypertension: Secondary | ICD-10-CM | POA: Diagnosis not present

## 2014-12-16 DIAGNOSIS — D508 Other iron deficiency anemias: Secondary | ICD-10-CM | POA: Diagnosis not present

## 2014-12-16 DIAGNOSIS — Z96651 Presence of right artificial knee joint: Secondary | ICD-10-CM | POA: Diagnosis not present

## 2014-12-16 DIAGNOSIS — E119 Type 2 diabetes mellitus without complications: Secondary | ICD-10-CM | POA: Diagnosis not present

## 2014-12-19 DIAGNOSIS — M1711 Unilateral primary osteoarthritis, right knee: Secondary | ICD-10-CM | POA: Diagnosis not present

## 2014-12-21 DIAGNOSIS — M1711 Unilateral primary osteoarthritis, right knee: Secondary | ICD-10-CM | POA: Diagnosis not present

## 2014-12-24 DIAGNOSIS — M1711 Unilateral primary osteoarthritis, right knee: Secondary | ICD-10-CM | POA: Diagnosis not present

## 2014-12-26 DIAGNOSIS — Z96651 Presence of right artificial knee joint: Secondary | ICD-10-CM | POA: Diagnosis not present

## 2014-12-26 DIAGNOSIS — M1711 Unilateral primary osteoarthritis, right knee: Secondary | ICD-10-CM | POA: Diagnosis not present

## 2014-12-26 DIAGNOSIS — Z471 Aftercare following joint replacement surgery: Secondary | ICD-10-CM | POA: Diagnosis not present

## 2014-12-28 DIAGNOSIS — M1711 Unilateral primary osteoarthritis, right knee: Secondary | ICD-10-CM | POA: Diagnosis not present

## 2014-12-31 DIAGNOSIS — M1711 Unilateral primary osteoarthritis, right knee: Secondary | ICD-10-CM | POA: Diagnosis not present

## 2015-01-02 DIAGNOSIS — M1711 Unilateral primary osteoarthritis, right knee: Secondary | ICD-10-CM | POA: Diagnosis not present

## 2015-01-04 DIAGNOSIS — M1711 Unilateral primary osteoarthritis, right knee: Secondary | ICD-10-CM | POA: Diagnosis not present

## 2015-01-07 DIAGNOSIS — M1711 Unilateral primary osteoarthritis, right knee: Secondary | ICD-10-CM | POA: Diagnosis not present

## 2015-01-09 DIAGNOSIS — M1711 Unilateral primary osteoarthritis, right knee: Secondary | ICD-10-CM | POA: Diagnosis not present

## 2015-01-14 DIAGNOSIS — M1711 Unilateral primary osteoarthritis, right knee: Secondary | ICD-10-CM | POA: Diagnosis not present

## 2015-01-16 DIAGNOSIS — M1711 Unilateral primary osteoarthritis, right knee: Secondary | ICD-10-CM | POA: Diagnosis not present

## 2015-01-23 DIAGNOSIS — Z471 Aftercare following joint replacement surgery: Secondary | ICD-10-CM | POA: Diagnosis not present

## 2015-01-23 DIAGNOSIS — M1711 Unilateral primary osteoarthritis, right knee: Secondary | ICD-10-CM | POA: Diagnosis not present

## 2015-01-23 DIAGNOSIS — Z96651 Presence of right artificial knee joint: Secondary | ICD-10-CM | POA: Diagnosis not present

## 2015-02-12 DIAGNOSIS — E119 Type 2 diabetes mellitus without complications: Secondary | ICD-10-CM | POA: Diagnosis not present

## 2015-02-19 DIAGNOSIS — E1151 Type 2 diabetes mellitus with diabetic peripheral angiopathy without gangrene: Secondary | ICD-10-CM | POA: Diagnosis not present

## 2015-02-19 DIAGNOSIS — Z23 Encounter for immunization: Secondary | ICD-10-CM | POA: Diagnosis not present

## 2015-02-19 DIAGNOSIS — E039 Hypothyroidism, unspecified: Secondary | ICD-10-CM | POA: Diagnosis not present

## 2015-02-19 DIAGNOSIS — Z794 Long term (current) use of insulin: Secondary | ICD-10-CM | POA: Diagnosis not present

## 2015-02-19 DIAGNOSIS — E279 Disorder of adrenal gland, unspecified: Secondary | ICD-10-CM | POA: Diagnosis not present

## 2015-02-19 DIAGNOSIS — E1142 Type 2 diabetes mellitus with diabetic polyneuropathy: Secondary | ICD-10-CM | POA: Diagnosis not present

## 2015-03-06 DIAGNOSIS — Z471 Aftercare following joint replacement surgery: Secondary | ICD-10-CM | POA: Diagnosis not present

## 2015-03-06 DIAGNOSIS — Z96651 Presence of right artificial knee joint: Secondary | ICD-10-CM | POA: Diagnosis not present

## 2015-04-02 DIAGNOSIS — Z0000 Encounter for general adult medical examination without abnormal findings: Secondary | ICD-10-CM | POA: Diagnosis not present

## 2015-04-02 DIAGNOSIS — Z1389 Encounter for screening for other disorder: Secondary | ICD-10-CM | POA: Diagnosis not present

## 2015-04-02 DIAGNOSIS — E1151 Type 2 diabetes mellitus with diabetic peripheral angiopathy without gangrene: Secondary | ICD-10-CM | POA: Diagnosis not present

## 2015-04-02 DIAGNOSIS — I10 Essential (primary) hypertension: Secondary | ICD-10-CM | POA: Diagnosis not present

## 2015-04-02 DIAGNOSIS — Z79899 Other long term (current) drug therapy: Secondary | ICD-10-CM | POA: Diagnosis not present

## 2015-04-02 DIAGNOSIS — E7800 Pure hypercholesterolemia, unspecified: Secondary | ICD-10-CM | POA: Diagnosis not present

## 2015-04-02 DIAGNOSIS — Z7189 Other specified counseling: Secondary | ICD-10-CM | POA: Diagnosis not present

## 2015-04-02 DIAGNOSIS — D72829 Elevated white blood cell count, unspecified: Secondary | ICD-10-CM | POA: Diagnosis not present

## 2015-04-02 DIAGNOSIS — M8589 Other specified disorders of bone density and structure, multiple sites: Secondary | ICD-10-CM | POA: Diagnosis not present

## 2015-04-02 DIAGNOSIS — E039 Hypothyroidism, unspecified: Secondary | ICD-10-CM | POA: Diagnosis not present

## 2015-04-02 DIAGNOSIS — D692 Other nonthrombocytopenic purpura: Secondary | ICD-10-CM | POA: Diagnosis not present

## 2015-04-09 DIAGNOSIS — D72829 Elevated white blood cell count, unspecified: Secondary | ICD-10-CM | POA: Diagnosis not present

## 2015-04-10 DIAGNOSIS — Z471 Aftercare following joint replacement surgery: Secondary | ICD-10-CM | POA: Diagnosis not present

## 2015-04-10 DIAGNOSIS — T814XXA Infection following a procedure, initial encounter: Secondary | ICD-10-CM | POA: Diagnosis not present

## 2015-04-10 DIAGNOSIS — Z96651 Presence of right artificial knee joint: Secondary | ICD-10-CM | POA: Diagnosis not present

## 2015-05-07 DIAGNOSIS — M85851 Other specified disorders of bone density and structure, right thigh: Secondary | ICD-10-CM | POA: Diagnosis not present

## 2015-05-07 DIAGNOSIS — M8589 Other specified disorders of bone density and structure, multiple sites: Secondary | ICD-10-CM | POA: Diagnosis not present

## 2015-05-21 DIAGNOSIS — E039 Hypothyroidism, unspecified: Secondary | ICD-10-CM | POA: Diagnosis not present

## 2015-05-21 DIAGNOSIS — Z7984 Long term (current) use of oral hypoglycemic drugs: Secondary | ICD-10-CM | POA: Diagnosis not present

## 2015-05-21 DIAGNOSIS — E1151 Type 2 diabetes mellitus with diabetic peripheral angiopathy without gangrene: Secondary | ICD-10-CM | POA: Diagnosis not present

## 2015-05-21 DIAGNOSIS — E1142 Type 2 diabetes mellitus with diabetic polyneuropathy: Secondary | ICD-10-CM | POA: Diagnosis not present

## 2015-05-21 DIAGNOSIS — E279 Disorder of adrenal gland, unspecified: Secondary | ICD-10-CM | POA: Diagnosis not present

## 2015-06-18 IMAGING — DX DG CHEST 1V PORT
1 series · 1 of 1 positions shown · non-contrast
Comparison: October 22, 2013

CLINICAL DATA: Sepsis

EXAM:
PORTABLE CHEST - 1 VIEW

[chest ap]
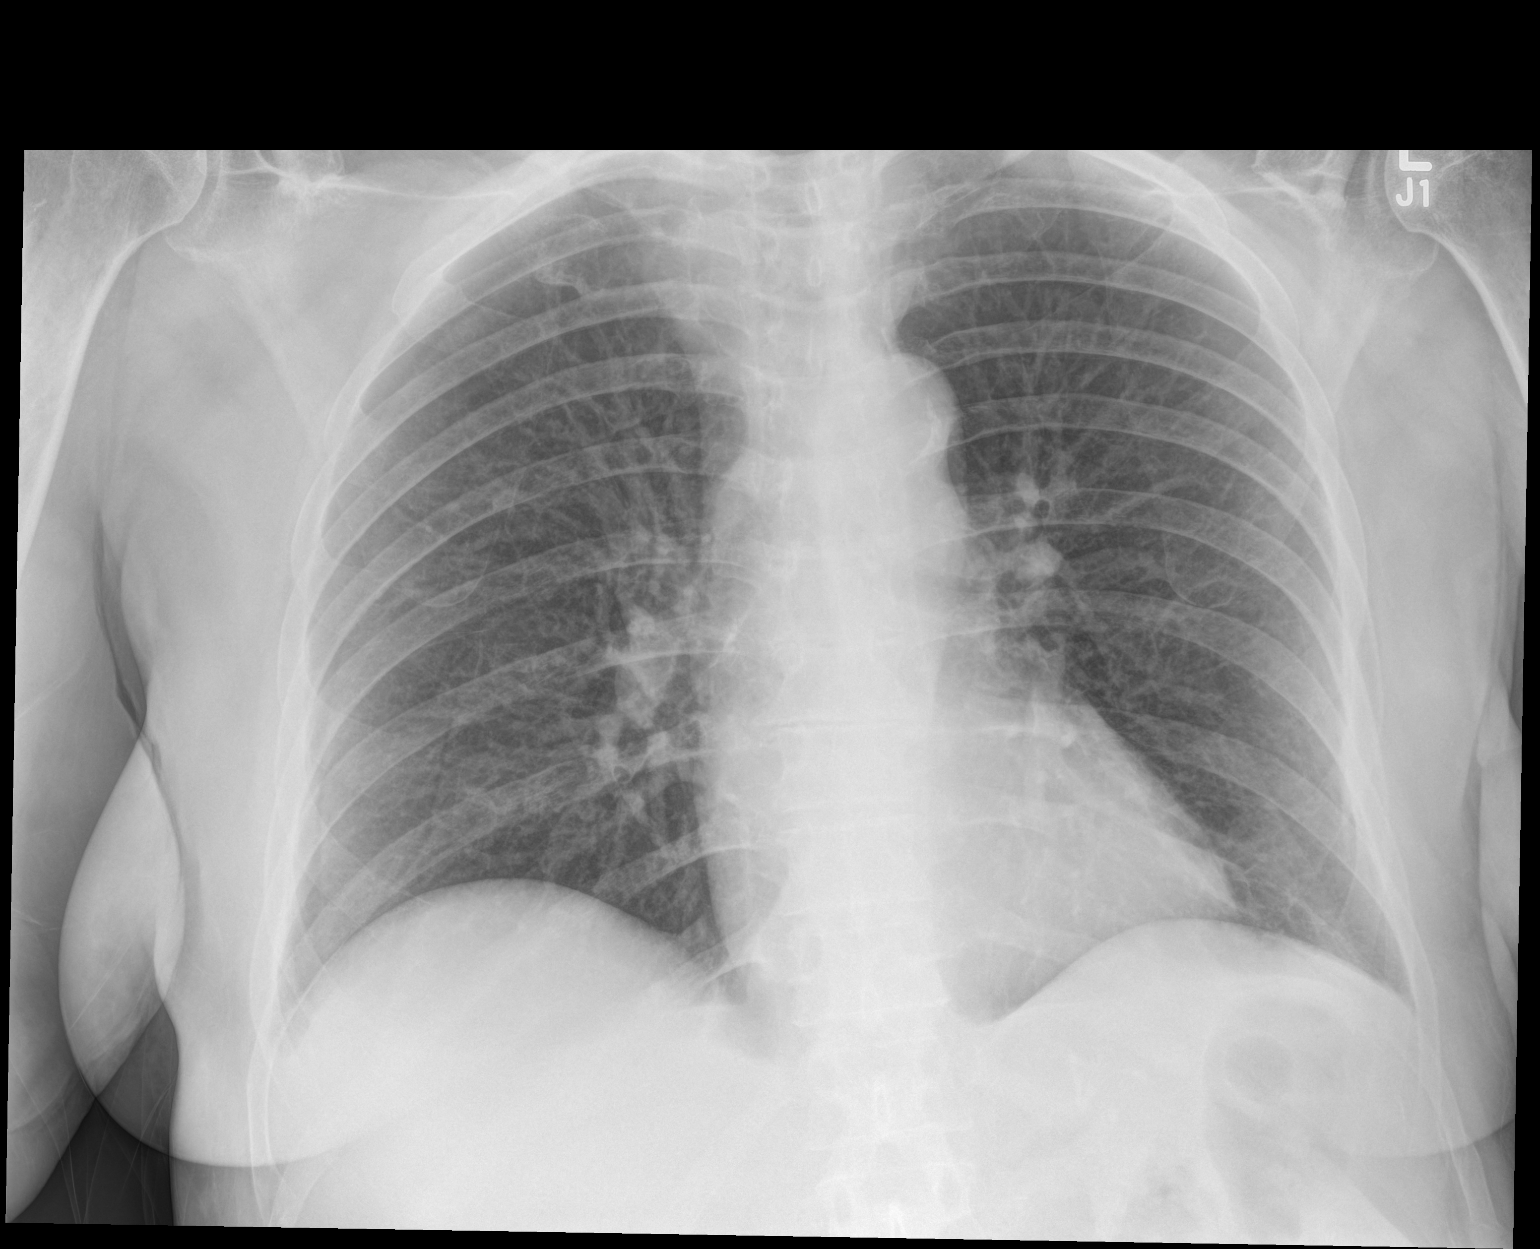

[1 of 1 positions shown; findings below may reference images not displayed]

FINDINGS: There is minimal scarring in the left base. Elsewhere lungs are
clear. Heart size and pulmonary vascularity are normal. No
adenopathy. No bone lesions. There is atherosclerotic change in the
aortic arch. No pneumothorax.
IMPRESSION: No edema or consolidation.

## 2015-06-18 IMAGING — CT CT ABD-PELV W/O CM
1 series · 12 of 32 positions shown, 15 images · non-contrast
Comparison: 07/01/2012

CLINICAL DATA: Flank pain, history of renal calculi

EXAM:
CT ABDOMEN AND PELVIS WITHOUT CONTRAST
TECHNIQUE: Multidetector CT imaging of the abdomen and pelvis was performed
following the standard protocol without IV contrast.

[Series 6: sagittal · sagittal · 0.74mm/px · 12 of 128 slices shown, 15 images]
[im 5/128  lung]
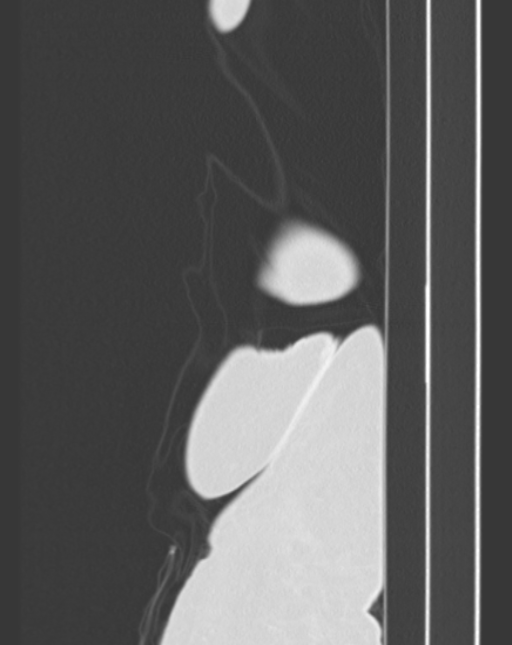
[im 9/128  soft-tissue]
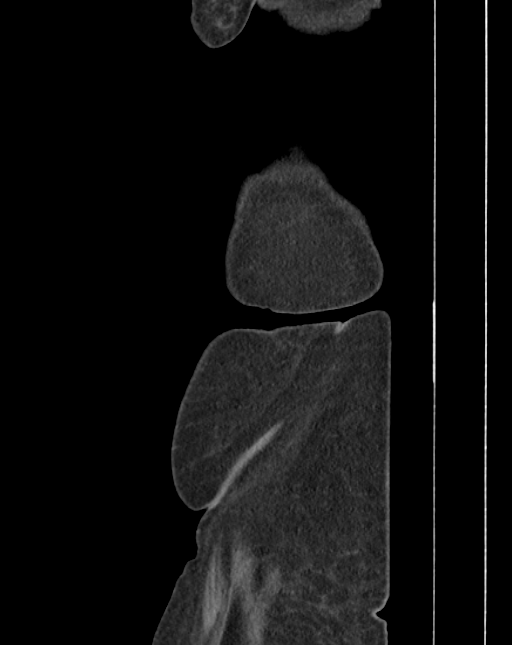
[im 9/128  lung]
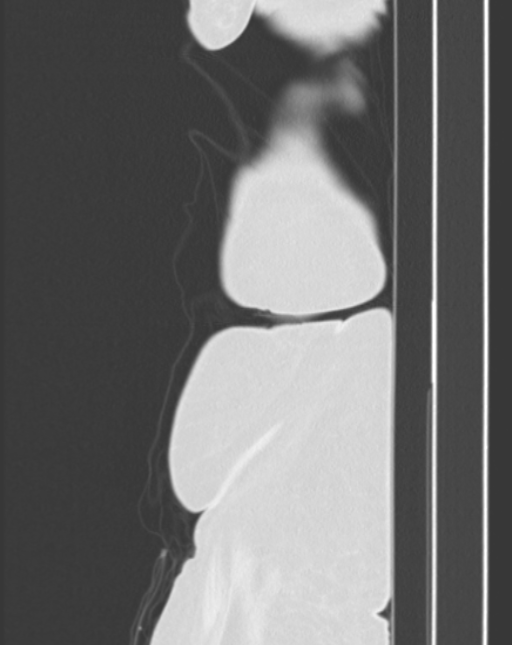
[im 9/128  bone]
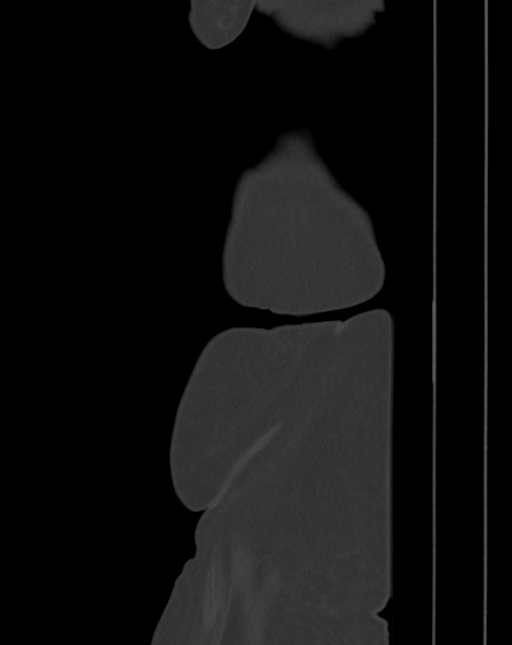
[im 13/128  lung]
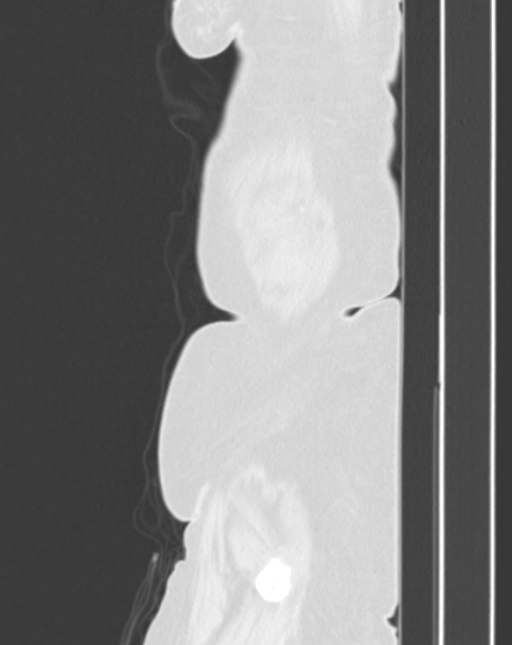
[im 17/128  lung]
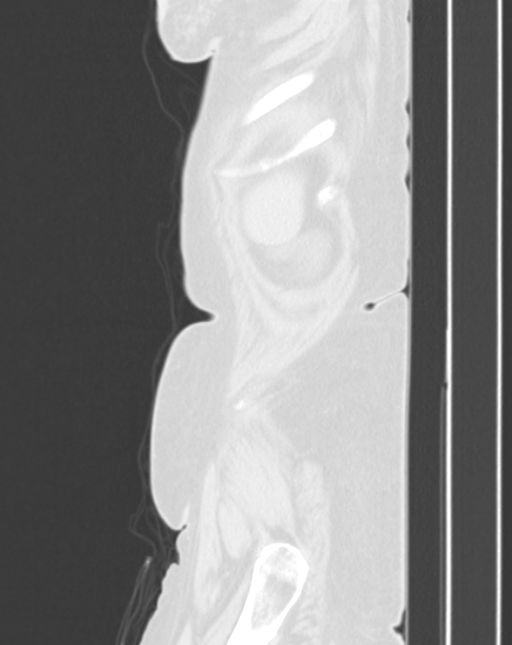
[im 25/128  soft-tissue]
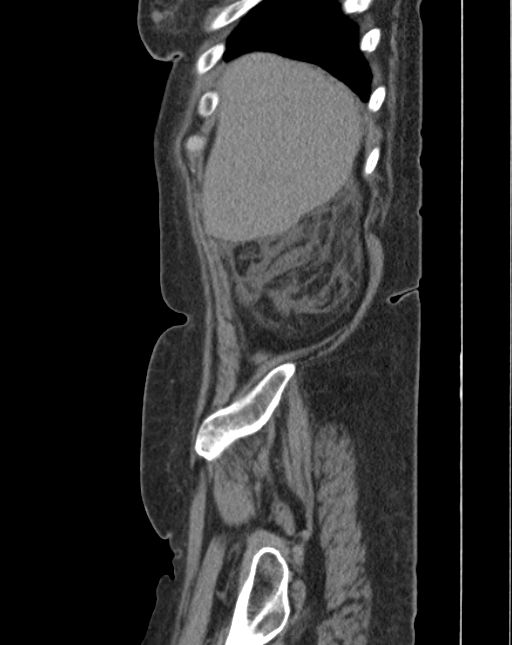
[im 37/128  soft-tissue]
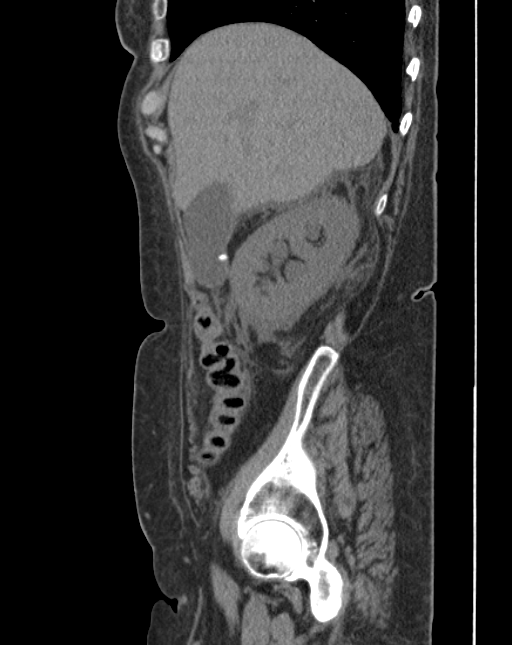
[im 50/128  soft-tissue]
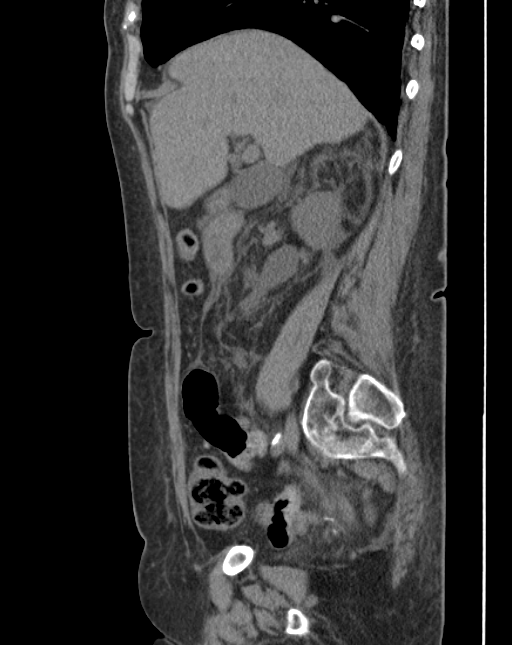
[im 66/128  soft-tissue]
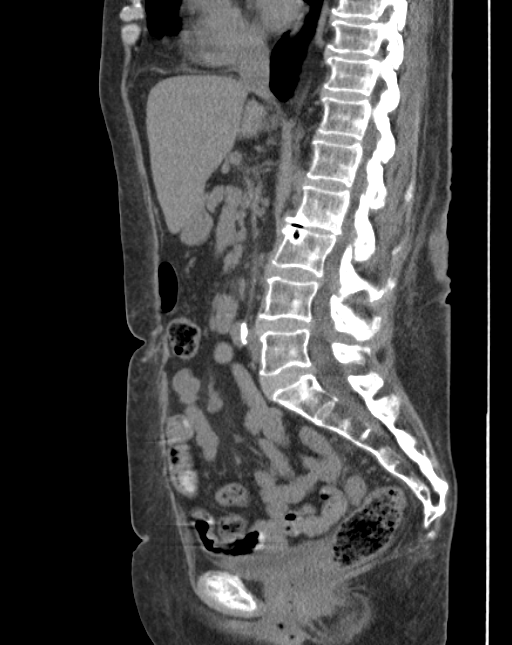
[im 78/128  soft-tissue]
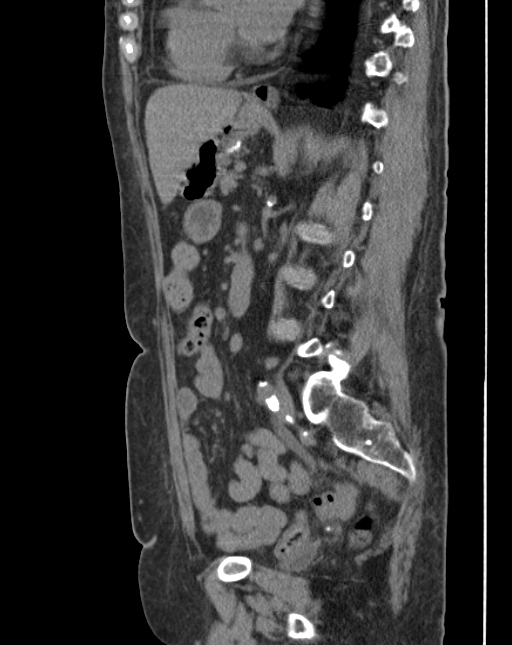
[im 91/128  soft-tissue]
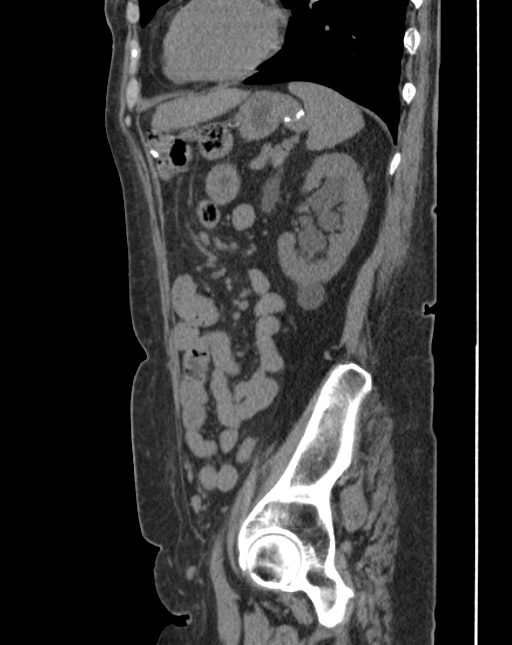
[im 107/128  soft-tissue]
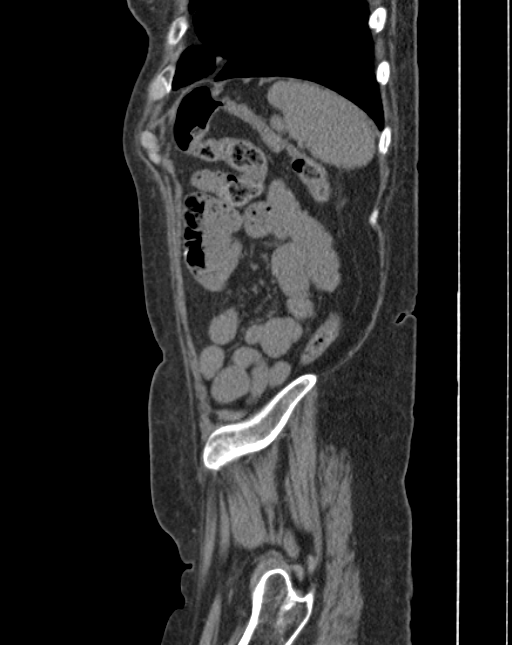
[im 119/128  soft-tissue]
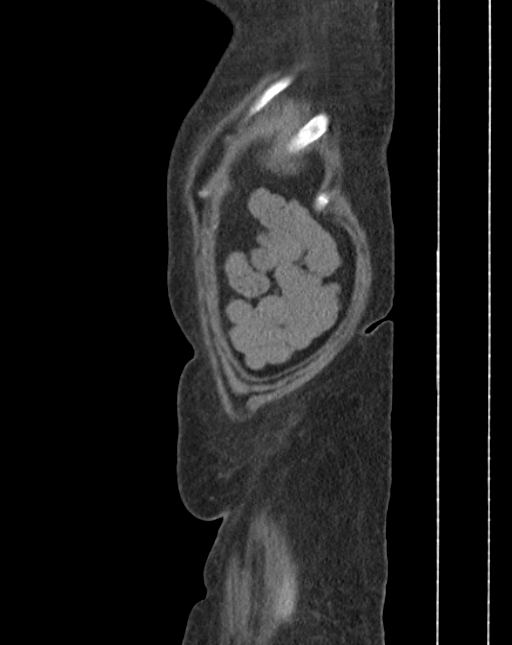
[im 119/128  bone]
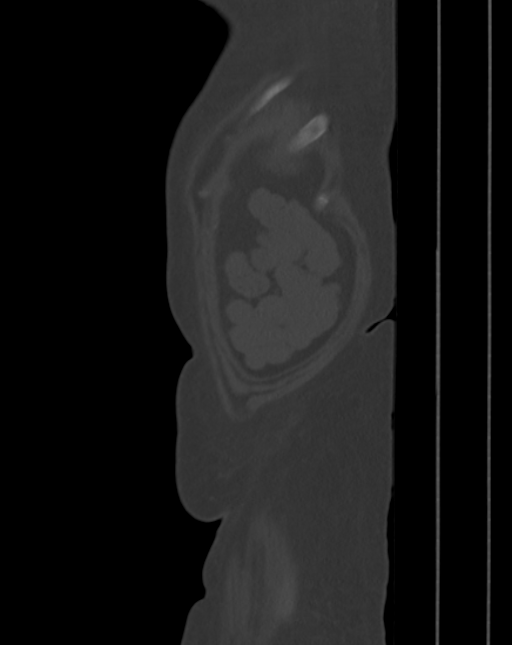

[12 of 32 positions shown; findings below may reference images not displayed]

FINDINGS: The lung bases are clear.

There is a 6.5 mm distal right ureteral calculus resulting in severe
right hydroureteronephrosis and severe right perinephric stranding
likely related to fornix rupture. There is an 8 mm distal left
ureteral calculus just proximal to the UVJ resulting in severe left
hydroureteronephrosis. There is right nephromegaly. There is no
exophytic renal mass. The bladder is unremarkable.

The liver demonstrates no focal abnormality. There are
cholelithiasis. The spleen demonstrates no focal abnormality. The
adrenal glands and pancreas are normal.

There is evidence of prior gastric bypass surgery with postsurgical
changes in the epigastric region. The unopacified duodenum, small
intestine and large intestine are unremarkable, but evaluation is
limited by lack of oral contrast. There is no bowel obstruction.
There is no pneumoperitoneum, pneumatosis, or portal venous gas.
There is no abdominal or pelvic free fluid. There is no
lymphadenopathy.

The abdominal aorta is normal in caliber with atherosclerosis.

There is lumbar spine spondylosis.
IMPRESSION: 1. There is a 6.5 mm distal right ureteral calculus resulting in
severe right hydroureteronephrosis and severe right perinephric
stranding likely related to fornix rupture.
2. There is an 8 mm distal left ureteral calculus just proximal to
the UVJ resulting in severe left hydroureteronephrosis.
3. Cholelithiasis.

## 2015-09-19 DIAGNOSIS — E119 Type 2 diabetes mellitus without complications: Secondary | ICD-10-CM | POA: Diagnosis not present

## 2015-09-23 ENCOUNTER — Other Ambulatory Visit: Payer: Self-pay

## 2015-09-23 DIAGNOSIS — Z1231 Encounter for screening mammogram for malignant neoplasm of breast: Secondary | ICD-10-CM

## 2015-09-26 ENCOUNTER — Encounter: Payer: Self-pay | Admitting: Gastroenterology

## 2015-10-01 DIAGNOSIS — E1142 Type 2 diabetes mellitus with diabetic polyneuropathy: Secondary | ICD-10-CM | POA: Diagnosis not present

## 2015-10-01 DIAGNOSIS — Z7984 Long term (current) use of oral hypoglycemic drugs: Secondary | ICD-10-CM | POA: Diagnosis not present

## 2015-10-01 DIAGNOSIS — I10 Essential (primary) hypertension: Secondary | ICD-10-CM | POA: Diagnosis not present

## 2015-10-08 ENCOUNTER — Ambulatory Visit
Admission: RE | Admit: 2015-10-08 | Discharge: 2015-10-08 | Disposition: A | Discharge status: Home / Self Care | Payer: Medicare Other | Source: Ambulatory Visit

## 2015-10-08 DIAGNOSIS — Z1231 Encounter for screening mammogram for malignant neoplasm of breast: Secondary | ICD-10-CM | POA: Diagnosis not present

## 2015-10-23 DIAGNOSIS — I10 Essential (primary) hypertension: Secondary | ICD-10-CM | POA: Diagnosis not present

## 2015-11-28 DIAGNOSIS — Z5181 Encounter for therapeutic drug level monitoring: Secondary | ICD-10-CM | POA: Diagnosis not present

## 2015-11-28 DIAGNOSIS — Z720 Tobacco use: Secondary | ICD-10-CM | POA: Diagnosis not present

## 2015-11-28 DIAGNOSIS — Z7984 Long term (current) use of oral hypoglycemic drugs: Secondary | ICD-10-CM | POA: Diagnosis not present

## 2015-11-28 DIAGNOSIS — E039 Hypothyroidism, unspecified: Secondary | ICD-10-CM | POA: Diagnosis not present

## 2015-11-28 DIAGNOSIS — E1151 Type 2 diabetes mellitus with diabetic peripheral angiopathy without gangrene: Secondary | ICD-10-CM | POA: Diagnosis not present

## 2015-11-28 DIAGNOSIS — E279 Disorder of adrenal gland, unspecified: Secondary | ICD-10-CM | POA: Diagnosis not present

## 2015-11-28 DIAGNOSIS — E1142 Type 2 diabetes mellitus with diabetic polyneuropathy: Secondary | ICD-10-CM | POA: Diagnosis not present

## 2015-12-11 DIAGNOSIS — Z96652 Presence of left artificial knee joint: Secondary | ICD-10-CM | POA: Diagnosis not present

## 2015-12-11 DIAGNOSIS — Z471 Aftercare following joint replacement surgery: Secondary | ICD-10-CM | POA: Diagnosis not present

## 2015-12-26 DIAGNOSIS — S63501A Unspecified sprain of right wrist, initial encounter: Secondary | ICD-10-CM | POA: Diagnosis not present

## 2015-12-26 DIAGNOSIS — M79641 Pain in right hand: Secondary | ICD-10-CM | POA: Diagnosis not present

## 2016-01-06 DIAGNOSIS — S63501D Unspecified sprain of right wrist, subsequent encounter: Secondary | ICD-10-CM | POA: Diagnosis not present

## 2016-01-06 DIAGNOSIS — M79641 Pain in right hand: Secondary | ICD-10-CM | POA: Diagnosis not present

## 2016-01-29 ENCOUNTER — Other Ambulatory Visit: Payer: Self-pay | Admitting: Gastroenterology

## 2016-01-29 DIAGNOSIS — Z1211 Encounter for screening for malignant neoplasm of colon: Secondary | ICD-10-CM | POA: Diagnosis not present

## 2016-01-29 DIAGNOSIS — D120 Benign neoplasm of cecum: Secondary | ICD-10-CM | POA: Diagnosis not present

## 2016-01-29 DIAGNOSIS — D125 Benign neoplasm of sigmoid colon: Secondary | ICD-10-CM | POA: Diagnosis not present

## 2016-01-29 DIAGNOSIS — K635 Polyp of colon: Secondary | ICD-10-CM | POA: Diagnosis not present

## 2016-01-29 DIAGNOSIS — D126 Benign neoplasm of colon, unspecified: Secondary | ICD-10-CM | POA: Diagnosis not present

## 2016-03-04 DIAGNOSIS — Z5181 Encounter for therapeutic drug level monitoring: Secondary | ICD-10-CM | POA: Diagnosis not present

## 2016-03-04 DIAGNOSIS — E039 Hypothyroidism, unspecified: Secondary | ICD-10-CM | POA: Diagnosis not present

## 2016-03-04 DIAGNOSIS — Z7984 Long term (current) use of oral hypoglycemic drugs: Secondary | ICD-10-CM | POA: Diagnosis not present

## 2016-03-04 DIAGNOSIS — Z79899 Other long term (current) drug therapy: Secondary | ICD-10-CM | POA: Diagnosis not present

## 2016-03-04 DIAGNOSIS — E1142 Type 2 diabetes mellitus with diabetic polyneuropathy: Secondary | ICD-10-CM | POA: Diagnosis not present

## 2016-03-05 DIAGNOSIS — Z23 Encounter for immunization: Secondary | ICD-10-CM | POA: Diagnosis not present

## 2016-03-06 ENCOUNTER — Encounter (HOSPITAL_COMMUNITY): Payer: Self-pay

## 2016-04-07 ENCOUNTER — Other Ambulatory Visit: Payer: Self-pay | Admitting: Geriatric Medicine

## 2016-04-07 DIAGNOSIS — E7800 Pure hypercholesterolemia, unspecified: Secondary | ICD-10-CM | POA: Diagnosis not present

## 2016-04-07 DIAGNOSIS — E1142 Type 2 diabetes mellitus with diabetic polyneuropathy: Secondary | ICD-10-CM | POA: Diagnosis not present

## 2016-04-07 DIAGNOSIS — F17210 Nicotine dependence, cigarettes, uncomplicated: Secondary | ICD-10-CM | POA: Diagnosis not present

## 2016-04-07 DIAGNOSIS — E1151 Type 2 diabetes mellitus with diabetic peripheral angiopathy without gangrene: Secondary | ICD-10-CM | POA: Diagnosis not present

## 2016-04-07 DIAGNOSIS — E039 Hypothyroidism, unspecified: Secondary | ICD-10-CM | POA: Diagnosis not present

## 2016-04-07 DIAGNOSIS — Z720 Tobacco use: Secondary | ICD-10-CM

## 2016-04-07 DIAGNOSIS — Z0000 Encounter for general adult medical examination without abnormal findings: Secondary | ICD-10-CM | POA: Diagnosis not present

## 2016-04-07 DIAGNOSIS — Z1389 Encounter for screening for other disorder: Secondary | ICD-10-CM | POA: Diagnosis not present

## 2016-04-07 DIAGNOSIS — I10 Essential (primary) hypertension: Secondary | ICD-10-CM | POA: Diagnosis not present

## 2016-04-15 ENCOUNTER — Ambulatory Visit
Admission: RE | Admit: 2016-04-15 | Discharge: 2016-04-15 | Disposition: A | Discharge status: Home / Self Care | Payer: Medicare Other | Source: Ambulatory Visit | Attending: Geriatric Medicine | Admitting: Geriatric Medicine

## 2016-04-15 DIAGNOSIS — Z720 Tobacco use: Secondary | ICD-10-CM

## 2016-04-15 DIAGNOSIS — Z87891 Personal history of nicotine dependence: Secondary | ICD-10-CM | POA: Diagnosis not present

## 2016-04-28 DIAGNOSIS — F17210 Nicotine dependence, cigarettes, uncomplicated: Secondary | ICD-10-CM | POA: Diagnosis not present

## 2016-06-09 DIAGNOSIS — E039 Hypothyroidism, unspecified: Secondary | ICD-10-CM | POA: Diagnosis not present

## 2016-06-09 DIAGNOSIS — E1142 Type 2 diabetes mellitus with diabetic polyneuropathy: Secondary | ICD-10-CM | POA: Diagnosis not present

## 2016-06-09 DIAGNOSIS — E279 Disorder of adrenal gland, unspecified: Secondary | ICD-10-CM | POA: Diagnosis not present

## 2016-06-09 DIAGNOSIS — Z5181 Encounter for therapeutic drug level monitoring: Secondary | ICD-10-CM | POA: Diagnosis not present

## 2016-06-09 DIAGNOSIS — Z720 Tobacco use: Secondary | ICD-10-CM | POA: Diagnosis not present

## 2016-06-09 DIAGNOSIS — E1151 Type 2 diabetes mellitus with diabetic peripheral angiopathy without gangrene: Secondary | ICD-10-CM | POA: Diagnosis not present

## 2016-10-05 DIAGNOSIS — E1151 Type 2 diabetes mellitus with diabetic peripheral angiopathy without gangrene: Secondary | ICD-10-CM | POA: Diagnosis not present

## 2016-10-05 DIAGNOSIS — I10 Essential (primary) hypertension: Secondary | ICD-10-CM | POA: Diagnosis not present

## 2016-10-05 DIAGNOSIS — E1142 Type 2 diabetes mellitus with diabetic polyneuropathy: Secondary | ICD-10-CM | POA: Diagnosis not present

## 2016-10-05 DIAGNOSIS — Z7984 Long term (current) use of oral hypoglycemic drugs: Secondary | ICD-10-CM | POA: Diagnosis not present

## 2016-10-05 DIAGNOSIS — E7800 Pure hypercholesterolemia, unspecified: Secondary | ICD-10-CM | POA: Diagnosis not present

## 2016-10-05 DIAGNOSIS — Z79899 Other long term (current) drug therapy: Secondary | ICD-10-CM | POA: Diagnosis not present

## 2016-10-14 ENCOUNTER — Other Ambulatory Visit: Payer: Self-pay | Admitting: Geriatric Medicine

## 2016-10-14 DIAGNOSIS — Z1231 Encounter for screening mammogram for malignant neoplasm of breast: Secondary | ICD-10-CM

## 2016-10-30 ENCOUNTER — Ambulatory Visit
Admission: RE | Admit: 2016-10-30 | Discharge: 2016-10-30 | Disposition: A | Discharge status: Home / Self Care | Payer: Medicare Other | Source: Ambulatory Visit | Attending: Geriatric Medicine | Admitting: Geriatric Medicine

## 2016-10-30 DIAGNOSIS — Z1231 Encounter for screening mammogram for malignant neoplasm of breast: Secondary | ICD-10-CM

## 2016-11-10 DIAGNOSIS — M545 Low back pain: Secondary | ICD-10-CM | POA: Diagnosis not present

## 2016-11-10 DIAGNOSIS — M5136 Other intervertebral disc degeneration, lumbar region: Secondary | ICD-10-CM | POA: Diagnosis not present

## 2016-11-27 DIAGNOSIS — S62646A Nondisplaced fracture of proximal phalanx of right little finger, initial encounter for closed fracture: Secondary | ICD-10-CM | POA: Diagnosis not present

## 2016-11-27 DIAGNOSIS — S62616A Displaced fracture of proximal phalanx of right little finger, initial encounter for closed fracture: Secondary | ICD-10-CM | POA: Diagnosis not present

## 2016-11-27 DIAGNOSIS — S60221A Contusion of right hand, initial encounter: Secondary | ICD-10-CM | POA: Diagnosis not present

## 2016-11-27 DIAGNOSIS — W19XXXA Unspecified fall, initial encounter: Secondary | ICD-10-CM | POA: Diagnosis not present

## 2016-11-27 DIAGNOSIS — S20212A Contusion of left front wall of thorax, initial encounter: Secondary | ICD-10-CM | POA: Diagnosis not present

## 2016-12-01 DIAGNOSIS — M79644 Pain in right finger(s): Secondary | ICD-10-CM | POA: Diagnosis not present

## 2016-12-01 DIAGNOSIS — S62616A Displaced fracture of proximal phalanx of right little finger, initial encounter for closed fracture: Secondary | ICD-10-CM | POA: Diagnosis not present

## 2016-12-04 DIAGNOSIS — I10 Essential (primary) hypertension: Secondary | ICD-10-CM | POA: Diagnosis not present

## 2016-12-04 DIAGNOSIS — F17210 Nicotine dependence, cigarettes, uncomplicated: Secondary | ICD-10-CM | POA: Diagnosis not present

## 2016-12-04 DIAGNOSIS — S2242XD Multiple fractures of ribs, left side, subsequent encounter for fracture with routine healing: Secondary | ICD-10-CM | POA: Diagnosis not present

## 2016-12-09 DIAGNOSIS — S62616D Displaced fracture of proximal phalanx of right little finger, subsequent encounter for fracture with routine healing: Secondary | ICD-10-CM | POA: Diagnosis not present

## 2016-12-23 DIAGNOSIS — S62616D Displaced fracture of proximal phalanx of right little finger, subsequent encounter for fracture with routine healing: Secondary | ICD-10-CM | POA: Diagnosis not present

## 2017-01-11 DIAGNOSIS — S62616D Displaced fracture of proximal phalanx of right little finger, subsequent encounter for fracture with routine healing: Secondary | ICD-10-CM | POA: Diagnosis not present

## 2017-01-26 DIAGNOSIS — Z5181 Encounter for therapeutic drug level monitoring: Secondary | ICD-10-CM | POA: Diagnosis not present

## 2017-01-26 DIAGNOSIS — E1151 Type 2 diabetes mellitus with diabetic peripheral angiopathy without gangrene: Secondary | ICD-10-CM | POA: Diagnosis not present

## 2017-01-26 DIAGNOSIS — Z720 Tobacco use: Secondary | ICD-10-CM | POA: Diagnosis not present

## 2017-01-26 DIAGNOSIS — E1142 Type 2 diabetes mellitus with diabetic polyneuropathy: Secondary | ICD-10-CM | POA: Diagnosis not present

## 2017-01-26 DIAGNOSIS — E039 Hypothyroidism, unspecified: Secondary | ICD-10-CM | POA: Diagnosis not present

## 2017-01-26 DIAGNOSIS — Z6830 Body mass index (BMI) 30.0-30.9, adult: Secondary | ICD-10-CM | POA: Diagnosis not present

## 2017-01-26 DIAGNOSIS — E669 Obesity, unspecified: Secondary | ICD-10-CM | POA: Diagnosis not present

## 2017-01-26 DIAGNOSIS — E279 Disorder of adrenal gland, unspecified: Secondary | ICD-10-CM | POA: Diagnosis not present

## 2017-03-04 DIAGNOSIS — Z23 Encounter for immunization: Secondary | ICD-10-CM | POA: Diagnosis not present

## 2017-03-10 DIAGNOSIS — E039 Hypothyroidism, unspecified: Secondary | ICD-10-CM | POA: Diagnosis not present

## 2017-03-10 DIAGNOSIS — E1142 Type 2 diabetes mellitus with diabetic polyneuropathy: Secondary | ICD-10-CM | POA: Diagnosis not present

## 2017-03-10 DIAGNOSIS — E1151 Type 2 diabetes mellitus with diabetic peripheral angiopathy without gangrene: Secondary | ICD-10-CM | POA: Diagnosis not present

## 2017-03-10 DIAGNOSIS — F329 Major depressive disorder, single episode, unspecified: Secondary | ICD-10-CM | POA: Diagnosis not present

## 2017-03-10 DIAGNOSIS — I10 Essential (primary) hypertension: Secondary | ICD-10-CM | POA: Diagnosis not present

## 2017-03-23 ENCOUNTER — Encounter (HOSPITAL_COMMUNITY): Payer: Self-pay

## 2017-03-30 DIAGNOSIS — I10 Essential (primary) hypertension: Secondary | ICD-10-CM | POA: Diagnosis not present

## 2017-03-30 DIAGNOSIS — H26491 Other secondary cataract, right eye: Secondary | ICD-10-CM | POA: Diagnosis not present

## 2017-03-30 DIAGNOSIS — H5202 Hypermetropia, left eye: Secondary | ICD-10-CM | POA: Diagnosis not present

## 2017-03-30 DIAGNOSIS — Z7984 Long term (current) use of oral hypoglycemic drugs: Secondary | ICD-10-CM | POA: Diagnosis not present

## 2017-03-30 DIAGNOSIS — H52223 Regular astigmatism, bilateral: Secondary | ICD-10-CM | POA: Diagnosis not present

## 2017-03-30 DIAGNOSIS — H524 Presbyopia: Secondary | ICD-10-CM | POA: Diagnosis not present

## 2017-03-30 DIAGNOSIS — Z961 Presence of intraocular lens: Secondary | ICD-10-CM | POA: Diagnosis not present

## 2017-03-30 DIAGNOSIS — E119 Type 2 diabetes mellitus without complications: Secondary | ICD-10-CM | POA: Diagnosis not present

## 2017-04-12 DIAGNOSIS — K219 Gastro-esophageal reflux disease without esophagitis: Secondary | ICD-10-CM | POA: Diagnosis not present

## 2017-04-12 DIAGNOSIS — I10 Essential (primary) hypertension: Secondary | ICD-10-CM | POA: Diagnosis not present

## 2017-04-12 DIAGNOSIS — E669 Obesity, unspecified: Secondary | ICD-10-CM | POA: Diagnosis not present

## 2017-04-12 DIAGNOSIS — Z0000 Encounter for general adult medical examination without abnormal findings: Secondary | ICD-10-CM | POA: Diagnosis not present

## 2017-04-12 DIAGNOSIS — E1151 Type 2 diabetes mellitus with diabetic peripheral angiopathy without gangrene: Secondary | ICD-10-CM | POA: Diagnosis not present

## 2017-04-12 DIAGNOSIS — Z13820 Encounter for screening for osteoporosis: Secondary | ICD-10-CM | POA: Diagnosis not present

## 2017-04-12 DIAGNOSIS — E039 Hypothyroidism, unspecified: Secondary | ICD-10-CM | POA: Diagnosis not present

## 2017-04-12 DIAGNOSIS — E1142 Type 2 diabetes mellitus with diabetic polyneuropathy: Secondary | ICD-10-CM | POA: Diagnosis not present

## 2017-04-12 DIAGNOSIS — Z7984 Long term (current) use of oral hypoglycemic drugs: Secondary | ICD-10-CM | POA: Diagnosis not present

## 2017-04-12 DIAGNOSIS — Z6830 Body mass index (BMI) 30.0-30.9, adult: Secondary | ICD-10-CM | POA: Diagnosis not present

## 2017-04-12 DIAGNOSIS — Z1389 Encounter for screening for other disorder: Secondary | ICD-10-CM | POA: Diagnosis not present

## 2017-04-12 DIAGNOSIS — F329 Major depressive disorder, single episode, unspecified: Secondary | ICD-10-CM | POA: Diagnosis not present

## 2017-04-14 DIAGNOSIS — H5989 Other postprocedural complications and disorders of eye and adnexa, not elsewhere classified: Secondary | ICD-10-CM | POA: Diagnosis not present

## 2017-04-14 DIAGNOSIS — Z961 Presence of intraocular lens: Secondary | ICD-10-CM | POA: Diagnosis not present

## 2017-04-14 DIAGNOSIS — E119 Type 2 diabetes mellitus without complications: Secondary | ICD-10-CM | POA: Diagnosis not present

## 2017-04-14 DIAGNOSIS — H26491 Other secondary cataract, right eye: Secondary | ICD-10-CM | POA: Diagnosis not present

## 2017-04-14 DIAGNOSIS — H43813 Vitreous degeneration, bilateral: Secondary | ICD-10-CM | POA: Diagnosis not present

## 2017-05-19 DIAGNOSIS — H26492 Other secondary cataract, left eye: Secondary | ICD-10-CM | POA: Diagnosis not present

## 2017-06-11 DIAGNOSIS — Z9884 Bariatric surgery status: Secondary | ICD-10-CM | POA: Diagnosis not present

## 2017-06-15 DIAGNOSIS — M8588 Other specified disorders of bone density and structure, other site: Secondary | ICD-10-CM | POA: Diagnosis not present

## 2017-06-15 DIAGNOSIS — M810 Age-related osteoporosis without current pathological fracture: Secondary | ICD-10-CM | POA: Diagnosis not present

## 2017-06-21 ENCOUNTER — Other Ambulatory Visit: Payer: Self-pay | Admitting: Geriatric Medicine

## 2017-06-21 DIAGNOSIS — F17210 Nicotine dependence, cigarettes, uncomplicated: Secondary | ICD-10-CM | POA: Diagnosis not present

## 2017-06-21 DIAGNOSIS — Z7984 Long term (current) use of oral hypoglycemic drugs: Secondary | ICD-10-CM | POA: Diagnosis not present

## 2017-06-21 DIAGNOSIS — M810 Age-related osteoporosis without current pathological fracture: Secondary | ICD-10-CM | POA: Diagnosis not present

## 2017-06-21 DIAGNOSIS — E1142 Type 2 diabetes mellitus with diabetic polyneuropathy: Secondary | ICD-10-CM | POA: Diagnosis not present

## 2017-06-25 ENCOUNTER — Ambulatory Visit: Payer: Medicare Other

## 2017-06-28 ENCOUNTER — Ambulatory Visit
Admission: RE | Admit: 2017-06-28 | Discharge: 2017-06-28 | Disposition: A | Discharge status: Home / Self Care | Payer: Medicare Other | Source: Ambulatory Visit | Attending: Geriatric Medicine | Admitting: Geriatric Medicine

## 2017-06-28 DIAGNOSIS — F17210 Nicotine dependence, cigarettes, uncomplicated: Secondary | ICD-10-CM

## 2017-06-28 DIAGNOSIS — Z87891 Personal history of nicotine dependence: Secondary | ICD-10-CM | POA: Diagnosis not present

## 2017-08-20 DIAGNOSIS — Z6830 Body mass index (BMI) 30.0-30.9, adult: Secondary | ICD-10-CM | POA: Diagnosis not present

## 2017-08-20 DIAGNOSIS — E669 Obesity, unspecified: Secondary | ICD-10-CM | POA: Diagnosis not present

## 2017-08-20 DIAGNOSIS — Z79899 Other long term (current) drug therapy: Secondary | ICD-10-CM | POA: Diagnosis not present

## 2017-08-20 DIAGNOSIS — E1151 Type 2 diabetes mellitus with diabetic peripheral angiopathy without gangrene: Secondary | ICD-10-CM | POA: Diagnosis not present

## 2017-08-20 DIAGNOSIS — E279 Disorder of adrenal gland, unspecified: Secondary | ICD-10-CM | POA: Diagnosis not present

## 2017-08-20 DIAGNOSIS — E039 Hypothyroidism, unspecified: Secondary | ICD-10-CM | POA: Diagnosis not present

## 2017-08-20 DIAGNOSIS — E1142 Type 2 diabetes mellitus with diabetic polyneuropathy: Secondary | ICD-10-CM | POA: Diagnosis not present

## 2017-10-12 DIAGNOSIS — E1142 Type 2 diabetes mellitus with diabetic polyneuropathy: Secondary | ICD-10-CM | POA: Diagnosis not present

## 2017-10-12 DIAGNOSIS — M8580 Other specified disorders of bone density and structure, unspecified site: Secondary | ICD-10-CM | POA: Diagnosis not present

## 2017-10-12 DIAGNOSIS — E7800 Pure hypercholesterolemia, unspecified: Secondary | ICD-10-CM | POA: Diagnosis not present

## 2017-10-12 DIAGNOSIS — E1151 Type 2 diabetes mellitus with diabetic peripheral angiopathy without gangrene: Secondary | ICD-10-CM | POA: Diagnosis not present

## 2017-10-12 DIAGNOSIS — Z79899 Other long term (current) drug therapy: Secondary | ICD-10-CM | POA: Diagnosis not present

## 2017-10-12 DIAGNOSIS — I10 Essential (primary) hypertension: Secondary | ICD-10-CM | POA: Diagnosis not present

## 2017-10-12 DIAGNOSIS — M545 Low back pain: Secondary | ICD-10-CM | POA: Diagnosis not present

## 2017-10-12 DIAGNOSIS — Z7984 Long term (current) use of oral hypoglycemic drugs: Secondary | ICD-10-CM | POA: Diagnosis not present

## 2017-10-12 DIAGNOSIS — D649 Anemia, unspecified: Secondary | ICD-10-CM | POA: Diagnosis not present

## 2017-10-15 ENCOUNTER — Other Ambulatory Visit: Payer: Self-pay | Admitting: Geriatric Medicine

## 2017-10-15 DIAGNOSIS — Z1231 Encounter for screening mammogram for malignant neoplasm of breast: Secondary | ICD-10-CM

## 2017-11-08 ENCOUNTER — Ambulatory Visit
Admission: RE | Admit: 2017-11-08 | Discharge: 2017-11-08 | Disposition: A | Discharge status: Home / Self Care | Payer: Medicare Other | Source: Ambulatory Visit | Attending: Geriatric Medicine | Admitting: Geriatric Medicine

## 2017-11-08 DIAGNOSIS — Z1231 Encounter for screening mammogram for malignant neoplasm of breast: Secondary | ICD-10-CM | POA: Diagnosis not present

## 2017-11-18 DIAGNOSIS — D649 Anemia, unspecified: Secondary | ICD-10-CM | POA: Diagnosis not present

## 2018-02-15 DIAGNOSIS — E039 Hypothyroidism, unspecified: Secondary | ICD-10-CM | POA: Diagnosis not present

## 2018-02-15 DIAGNOSIS — E1151 Type 2 diabetes mellitus with diabetic peripheral angiopathy without gangrene: Secondary | ICD-10-CM | POA: Diagnosis not present

## 2018-02-15 DIAGNOSIS — Z7984 Long term (current) use of oral hypoglycemic drugs: Secondary | ICD-10-CM | POA: Diagnosis not present

## 2018-02-15 DIAGNOSIS — E279 Disorder of adrenal gland, unspecified: Secondary | ICD-10-CM | POA: Diagnosis not present

## 2018-02-15 DIAGNOSIS — E669 Obesity, unspecified: Secondary | ICD-10-CM | POA: Diagnosis not present

## 2018-02-15 DIAGNOSIS — E1142 Type 2 diabetes mellitus with diabetic polyneuropathy: Secondary | ICD-10-CM | POA: Diagnosis not present

## 2018-02-15 DIAGNOSIS — Z23 Encounter for immunization: Secondary | ICD-10-CM | POA: Diagnosis not present

## 2018-02-15 DIAGNOSIS — Z5181 Encounter for therapeutic drug level monitoring: Secondary | ICD-10-CM | POA: Diagnosis not present

## 2018-04-13 DIAGNOSIS — E1142 Type 2 diabetes mellitus with diabetic polyneuropathy: Secondary | ICD-10-CM | POA: Diagnosis not present

## 2018-04-13 DIAGNOSIS — F329 Major depressive disorder, single episode, unspecified: Secondary | ICD-10-CM | POA: Diagnosis not present

## 2018-04-13 DIAGNOSIS — Z0000 Encounter for general adult medical examination without abnormal findings: Secondary | ICD-10-CM | POA: Diagnosis not present

## 2018-04-13 DIAGNOSIS — Z23 Encounter for immunization: Secondary | ICD-10-CM | POA: Diagnosis not present

## 2018-04-13 DIAGNOSIS — E1151 Type 2 diabetes mellitus with diabetic peripheral angiopathy without gangrene: Secondary | ICD-10-CM | POA: Diagnosis not present

## 2018-04-13 DIAGNOSIS — Z1389 Encounter for screening for other disorder: Secondary | ICD-10-CM | POA: Diagnosis not present

## 2018-04-13 DIAGNOSIS — Z79899 Other long term (current) drug therapy: Secondary | ICD-10-CM | POA: Diagnosis not present

## 2018-04-13 DIAGNOSIS — I10 Essential (primary) hypertension: Secondary | ICD-10-CM | POA: Diagnosis not present

## 2018-04-20 DIAGNOSIS — H524 Presbyopia: Secondary | ICD-10-CM | POA: Diagnosis not present

## 2018-04-20 DIAGNOSIS — Z961 Presence of intraocular lens: Secondary | ICD-10-CM | POA: Diagnosis not present

## 2018-04-20 DIAGNOSIS — H5202 Hypermetropia, left eye: Secondary | ICD-10-CM | POA: Diagnosis not present

## 2018-04-20 DIAGNOSIS — I10 Essential (primary) hypertension: Secondary | ICD-10-CM | POA: Diagnosis not present

## 2018-04-20 DIAGNOSIS — E119 Type 2 diabetes mellitus without complications: Secondary | ICD-10-CM | POA: Diagnosis not present

## 2018-04-20 DIAGNOSIS — H26491 Other secondary cataract, right eye: Secondary | ICD-10-CM | POA: Diagnosis not present

## 2018-04-20 DIAGNOSIS — Z7984 Long term (current) use of oral hypoglycemic drugs: Secondary | ICD-10-CM | POA: Diagnosis not present

## 2018-04-20 DIAGNOSIS — H52223 Regular astigmatism, bilateral: Secondary | ICD-10-CM | POA: Diagnosis not present

## 2018-06-06 DIAGNOSIS — K635 Polyp of colon: Secondary | ICD-10-CM | POA: Diagnosis not present

## 2018-06-06 DIAGNOSIS — D120 Benign neoplasm of cecum: Secondary | ICD-10-CM | POA: Diagnosis not present

## 2018-06-06 DIAGNOSIS — Z86010 Personal history of colonic polyps: Secondary | ICD-10-CM | POA: Diagnosis not present

## 2018-06-06 DIAGNOSIS — D123 Benign neoplasm of transverse colon: Secondary | ICD-10-CM | POA: Diagnosis not present

## 2018-06-08 DIAGNOSIS — K635 Polyp of colon: Secondary | ICD-10-CM | POA: Diagnosis not present

## 2018-06-08 DIAGNOSIS — D123 Benign neoplasm of transverse colon: Secondary | ICD-10-CM | POA: Diagnosis not present

## 2018-06-08 DIAGNOSIS — D120 Benign neoplasm of cecum: Secondary | ICD-10-CM | POA: Diagnosis not present

## 2018-09-21 DIAGNOSIS — E1142 Type 2 diabetes mellitus with diabetic polyneuropathy: Secondary | ICD-10-CM | POA: Diagnosis not present

## 2018-09-21 DIAGNOSIS — I10 Essential (primary) hypertension: Secondary | ICD-10-CM | POA: Diagnosis not present

## 2018-09-21 DIAGNOSIS — E1151 Type 2 diabetes mellitus with diabetic peripheral angiopathy without gangrene: Secondary | ICD-10-CM | POA: Diagnosis not present

## 2018-09-21 DIAGNOSIS — Z7984 Long term (current) use of oral hypoglycemic drugs: Secondary | ICD-10-CM | POA: Diagnosis not present

## 2018-09-21 DIAGNOSIS — Z0000 Encounter for general adult medical examination without abnormal findings: Secondary | ICD-10-CM | POA: Diagnosis not present

## 2018-10-31 ENCOUNTER — Other Ambulatory Visit: Payer: Self-pay | Admitting: Geriatric Medicine

## 2018-10-31 DIAGNOSIS — Z1231 Encounter for screening mammogram for malignant neoplasm of breast: Secondary | ICD-10-CM

## 2018-11-14 ENCOUNTER — Ambulatory Visit
Admission: RE | Admit: 2018-11-14 | Discharge: 2018-11-14 | Disposition: A | Discharge status: Home / Self Care | Payer: Medicare Other | Source: Ambulatory Visit | Attending: Geriatric Medicine | Admitting: Geriatric Medicine

## 2018-11-14 ENCOUNTER — Other Ambulatory Visit: Payer: Self-pay

## 2018-11-14 DIAGNOSIS — Z1231 Encounter for screening mammogram for malignant neoplasm of breast: Secondary | ICD-10-CM | POA: Diagnosis not present

## 2018-11-17 ENCOUNTER — Other Ambulatory Visit: Payer: Self-pay | Admitting: Geriatric Medicine

## 2018-11-17 DIAGNOSIS — F17210 Nicotine dependence, cigarettes, uncomplicated: Secondary | ICD-10-CM

## 2018-11-28 DIAGNOSIS — E1165 Type 2 diabetes mellitus with hyperglycemia: Secondary | ICD-10-CM | POA: Diagnosis not present

## 2018-11-28 DIAGNOSIS — E669 Obesity, unspecified: Secondary | ICD-10-CM | POA: Diagnosis not present

## 2018-11-28 DIAGNOSIS — Z79899 Other long term (current) drug therapy: Secondary | ICD-10-CM | POA: Diagnosis not present

## 2018-11-28 DIAGNOSIS — E1142 Type 2 diabetes mellitus with diabetic polyneuropathy: Secondary | ICD-10-CM | POA: Diagnosis not present

## 2018-11-28 DIAGNOSIS — E039 Hypothyroidism, unspecified: Secondary | ICD-10-CM | POA: Diagnosis not present

## 2018-11-28 DIAGNOSIS — E279 Disorder of adrenal gland, unspecified: Secondary | ICD-10-CM | POA: Diagnosis not present

## 2018-11-28 DIAGNOSIS — E1151 Type 2 diabetes mellitus with diabetic peripheral angiopathy without gangrene: Secondary | ICD-10-CM | POA: Diagnosis not present

## 2018-12-05 ENCOUNTER — Ambulatory Visit
Admission: RE | Admit: 2018-12-05 | Discharge: 2018-12-05 | Disposition: A | Discharge status: Home / Self Care | Payer: Medicare Other | Source: Ambulatory Visit | Attending: Geriatric Medicine | Admitting: Geriatric Medicine

## 2018-12-05 DIAGNOSIS — Z87891 Personal history of nicotine dependence: Secondary | ICD-10-CM | POA: Diagnosis not present

## 2018-12-05 DIAGNOSIS — F17210 Nicotine dependence, cigarettes, uncomplicated: Secondary | ICD-10-CM

## 2019-03-01 DIAGNOSIS — I10 Essential (primary) hypertension: Secondary | ICD-10-CM | POA: Diagnosis not present

## 2019-03-01 DIAGNOSIS — F329 Major depressive disorder, single episode, unspecified: Secondary | ICD-10-CM | POA: Diagnosis not present

## 2019-03-01 DIAGNOSIS — M810 Age-related osteoporosis without current pathological fracture: Secondary | ICD-10-CM | POA: Diagnosis not present

## 2019-03-01 DIAGNOSIS — E7800 Pure hypercholesterolemia, unspecified: Secondary | ICD-10-CM | POA: Diagnosis not present

## 2019-03-01 DIAGNOSIS — E1151 Type 2 diabetes mellitus with diabetic peripheral angiopathy without gangrene: Secondary | ICD-10-CM | POA: Diagnosis not present

## 2019-03-01 DIAGNOSIS — M8580 Other specified disorders of bone density and structure, unspecified site: Secondary | ICD-10-CM | POA: Diagnosis not present

## 2019-03-01 DIAGNOSIS — E1142 Type 2 diabetes mellitus with diabetic polyneuropathy: Secondary | ICD-10-CM | POA: Diagnosis not present

## 2019-03-01 DIAGNOSIS — E039 Hypothyroidism, unspecified: Secondary | ICD-10-CM | POA: Diagnosis not present

## 2019-03-23 DIAGNOSIS — D692 Other nonthrombocytopenic purpura: Secondary | ICD-10-CM | POA: Diagnosis not present

## 2019-03-23 DIAGNOSIS — L821 Other seborrheic keratosis: Secondary | ICD-10-CM | POA: Diagnosis not present

## 2019-03-23 DIAGNOSIS — D224 Melanocytic nevi of scalp and neck: Secondary | ICD-10-CM | POA: Diagnosis not present

## 2019-03-23 DIAGNOSIS — L814 Other melanin hyperpigmentation: Secondary | ICD-10-CM | POA: Diagnosis not present

## 2019-03-23 DIAGNOSIS — D220 Melanocytic nevi of lip: Secondary | ICD-10-CM | POA: Diagnosis not present

## 2019-03-23 DIAGNOSIS — D1801 Hemangioma of skin and subcutaneous tissue: Secondary | ICD-10-CM | POA: Diagnosis not present

## 2019-04-24 DIAGNOSIS — E1142 Type 2 diabetes mellitus with diabetic polyneuropathy: Secondary | ICD-10-CM | POA: Diagnosis not present

## 2019-04-24 DIAGNOSIS — I129 Hypertensive chronic kidney disease with stage 1 through stage 4 chronic kidney disease, or unspecified chronic kidney disease: Secondary | ICD-10-CM | POA: Diagnosis not present

## 2019-04-24 DIAGNOSIS — E039 Hypothyroidism, unspecified: Secondary | ICD-10-CM | POA: Diagnosis not present

## 2019-04-24 DIAGNOSIS — Z23 Encounter for immunization: Secondary | ICD-10-CM | POA: Diagnosis not present

## 2019-04-24 DIAGNOSIS — I700 Atherosclerosis of aorta: Secondary | ICD-10-CM | POA: Diagnosis not present

## 2019-04-24 DIAGNOSIS — E1151 Type 2 diabetes mellitus with diabetic peripheral angiopathy without gangrene: Secondary | ICD-10-CM | POA: Diagnosis not present

## 2019-04-24 DIAGNOSIS — N1831 Chronic kidney disease, stage 3a: Secondary | ICD-10-CM | POA: Diagnosis not present

## 2019-04-24 DIAGNOSIS — Z1389 Encounter for screening for other disorder: Secondary | ICD-10-CM | POA: Diagnosis not present

## 2019-04-24 DIAGNOSIS — I872 Venous insufficiency (chronic) (peripheral): Secondary | ICD-10-CM | POA: Diagnosis not present

## 2019-04-24 DIAGNOSIS — Z0000 Encounter for general adult medical examination without abnormal findings: Secondary | ICD-10-CM | POA: Diagnosis not present

## 2019-04-24 DIAGNOSIS — Z6836 Body mass index (BMI) 36.0-36.9, adult: Secondary | ICD-10-CM | POA: Diagnosis not present

## 2019-06-23 ENCOUNTER — Ambulatory Visit
Admission: RE | Admit: 2019-06-23 | Discharge: 2019-06-23 | Disposition: A | Discharge status: Home / Self Care | Payer: Medicare Other | Source: Ambulatory Visit | Attending: Geriatric Medicine | Admitting: Geriatric Medicine

## 2019-06-23 ENCOUNTER — Other Ambulatory Visit: Payer: Self-pay | Admitting: Geriatric Medicine

## 2019-06-23 DIAGNOSIS — M545 Low back pain, unspecified: Secondary | ICD-10-CM

## 2019-08-03 ENCOUNTER — Ambulatory Visit: Payer: Medicare Other | Attending: Internal Medicine

## 2019-08-03 DIAGNOSIS — Z23 Encounter for immunization: Secondary | ICD-10-CM

## 2019-08-03 NOTE — Progress Notes (Signed)
.    Covid-19 Vaccination Clinic  Name:  Sharon Lowe    MRN: [id number] DOB: 1945-10-24  08/03/2019  Sharon Lowe was observed post Covid-19 immunization for 15 minutes without incident. She was provided with Vaccine Information Sheet and instruction to access the V-Safe system.   Sharon Lowe was instructed to call 911 with any severe reactions post vaccine: Marland Kitchen Difficulty breathing  . Swelling of face and throat  . A fast heartbeat  . A bad rash all over body  . Dizziness and weakness

## 2019-08-29 ENCOUNTER — Ambulatory Visit: Payer: Medicare Other | Attending: Internal Medicine

## 2019-08-29 DIAGNOSIS — Z23 Encounter for immunization: Secondary | ICD-10-CM

## 2019-08-29 NOTE — Progress Notes (Signed)
   Covid-19 Vaccination Clinic  Name:  Sharon Lowe    MRN: [id number] DOB: [birth year]  08/29/2019  Ms. Kovalcik was observed post Covid-19 immunization for 15 minutes without incident. She was provided with Vaccine Information Sheet and instruction to access the V-Safe system.   Ms. Henner was instructed to call 911 with any severe reactions post vaccine: Marland Kitchen Difficulty breathing  . Swelling of face and throat  . A fast heartbeat  . A bad rash all over body  . Dizziness and weakness   Immunizations Administered    Name Date Dose VIS Date Route   Pfizer COVID-19 Vaccine 08/29/2019  3:15 PM 0.3 mL 05/12/2019 Intramuscular   Manufacturer: [employer]   Lot: [id number]   Segundo: [id number]

## 2019-09-26 ENCOUNTER — Other Ambulatory Visit: Payer: Self-pay | Admitting: Geriatric Medicine

## 2019-09-26 DIAGNOSIS — M5442 Lumbago with sciatica, left side: Secondary | ICD-10-CM

## 2019-10-23 ENCOUNTER — Ambulatory Visit
Admission: RE | Admit: 2019-10-23 | Discharge: 2019-10-23 | Disposition: A | Discharge status: Home / Self Care | Payer: Medicare Other | Source: Ambulatory Visit | Attending: Geriatric Medicine | Admitting: Geriatric Medicine

## 2019-10-23 ENCOUNTER — Other Ambulatory Visit: Payer: Self-pay

## 2019-10-23 DIAGNOSIS — M5442 Lumbago with sciatica, left side: Secondary | ICD-10-CM

## 2019-10-23 MED ORDER — GADOBENATE DIMEGLUMINE 529 MG/ML IV SOLN
20.0000 mL | Freq: Once | INTRAVENOUS | Status: AC | PRN
  Administered 2019-10-23: 20 mL via INTRAVENOUS

## 2019-12-15 ENCOUNTER — Other Ambulatory Visit: Payer: Self-pay | Admitting: Geriatric Medicine

## 2019-12-15 DIAGNOSIS — Z1231 Encounter for screening mammogram for malignant neoplasm of breast: Secondary | ICD-10-CM

## 2019-12-26 ENCOUNTER — Other Ambulatory Visit: Payer: Self-pay

## 2019-12-26 ENCOUNTER — Ambulatory Visit
Admission: RE | Admit: 2019-12-26 | Discharge: 2019-12-26 | Disposition: A | Discharge status: Home / Self Care | Payer: Medicare Other | Source: Ambulatory Visit | Attending: Geriatric Medicine | Admitting: Geriatric Medicine

## 2019-12-26 DIAGNOSIS — Z1231 Encounter for screening mammogram for malignant neoplasm of breast: Secondary | ICD-10-CM

## 2020-06-13 DIAGNOSIS — Z7189 Other specified counseling: Secondary | ICD-10-CM | POA: Diagnosis not present

## 2020-06-13 DIAGNOSIS — E1142 Type 2 diabetes mellitus with diabetic polyneuropathy: Secondary | ICD-10-CM | POA: Diagnosis not present

## 2020-06-13 DIAGNOSIS — E279 Disorder of adrenal gland, unspecified: Secondary | ICD-10-CM | POA: Diagnosis not present

## 2020-06-13 DIAGNOSIS — E1151 Type 2 diabetes mellitus with diabetic peripheral angiopathy without gangrene: Secondary | ICD-10-CM | POA: Diagnosis not present

## 2020-06-13 DIAGNOSIS — E039 Hypothyroidism, unspecified: Secondary | ICD-10-CM | POA: Diagnosis not present

## 2020-06-13 DIAGNOSIS — Z7984 Long term (current) use of oral hypoglycemic drugs: Secondary | ICD-10-CM | POA: Diagnosis not present

## 2020-07-05 DIAGNOSIS — M810 Age-related osteoporosis without current pathological fracture: Secondary | ICD-10-CM | POA: Diagnosis not present

## 2020-07-05 DIAGNOSIS — E039 Hypothyroidism, unspecified: Secondary | ICD-10-CM | POA: Diagnosis not present

## 2020-07-05 DIAGNOSIS — I10 Essential (primary) hypertension: Secondary | ICD-10-CM | POA: Diagnosis not present

## 2020-07-05 DIAGNOSIS — E7800 Pure hypercholesterolemia, unspecified: Secondary | ICD-10-CM | POA: Diagnosis not present

## 2020-07-05 DIAGNOSIS — M8580 Other specified disorders of bone density and structure, unspecified site: Secondary | ICD-10-CM | POA: Diagnosis not present

## 2020-07-05 DIAGNOSIS — E1142 Type 2 diabetes mellitus with diabetic polyneuropathy: Secondary | ICD-10-CM | POA: Diagnosis not present

## 2020-07-05 DIAGNOSIS — K219 Gastro-esophageal reflux disease without esophagitis: Secondary | ICD-10-CM | POA: Diagnosis not present

## 2020-07-05 DIAGNOSIS — E1151 Type 2 diabetes mellitus with diabetic peripheral angiopathy without gangrene: Secondary | ICD-10-CM | POA: Diagnosis not present

## 2020-07-05 DIAGNOSIS — I129 Hypertensive chronic kidney disease with stage 1 through stage 4 chronic kidney disease, or unspecified chronic kidney disease: Secondary | ICD-10-CM | POA: Diagnosis not present

## 2020-08-28 DIAGNOSIS — E1151 Type 2 diabetes mellitus with diabetic peripheral angiopathy without gangrene: Secondary | ICD-10-CM | POA: Diagnosis not present

## 2020-08-28 DIAGNOSIS — I10 Essential (primary) hypertension: Secondary | ICD-10-CM | POA: Diagnosis not present

## 2020-08-28 DIAGNOSIS — I129 Hypertensive chronic kidney disease with stage 1 through stage 4 chronic kidney disease, or unspecified chronic kidney disease: Secondary | ICD-10-CM | POA: Diagnosis not present

## 2020-08-28 DIAGNOSIS — M8580 Other specified disorders of bone density and structure, unspecified site: Secondary | ICD-10-CM | POA: Diagnosis not present

## 2020-08-28 DIAGNOSIS — M810 Age-related osteoporosis without current pathological fracture: Secondary | ICD-10-CM | POA: Diagnosis not present

## 2020-08-28 DIAGNOSIS — E7800 Pure hypercholesterolemia, unspecified: Secondary | ICD-10-CM | POA: Diagnosis not present

## 2020-08-28 DIAGNOSIS — E1142 Type 2 diabetes mellitus with diabetic polyneuropathy: Secondary | ICD-10-CM | POA: Diagnosis not present

## 2020-08-28 DIAGNOSIS — E039 Hypothyroidism, unspecified: Secondary | ICD-10-CM | POA: Diagnosis not present

## 2020-08-28 DIAGNOSIS — K219 Gastro-esophageal reflux disease without esophagitis: Secondary | ICD-10-CM | POA: Diagnosis not present

## 2020-09-26 DIAGNOSIS — E039 Hypothyroidism, unspecified: Secondary | ICD-10-CM | POA: Diagnosis not present

## 2020-09-26 DIAGNOSIS — I129 Hypertensive chronic kidney disease with stage 1 through stage 4 chronic kidney disease, or unspecified chronic kidney disease: Secondary | ICD-10-CM | POA: Diagnosis not present

## 2020-09-26 DIAGNOSIS — I10 Essential (primary) hypertension: Secondary | ICD-10-CM | POA: Diagnosis not present

## 2020-09-26 DIAGNOSIS — E1142 Type 2 diabetes mellitus with diabetic polyneuropathy: Secondary | ICD-10-CM | POA: Diagnosis not present

## 2020-09-26 DIAGNOSIS — M810 Age-related osteoporosis without current pathological fracture: Secondary | ICD-10-CM | POA: Diagnosis not present

## 2020-09-26 DIAGNOSIS — E7800 Pure hypercholesterolemia, unspecified: Secondary | ICD-10-CM | POA: Diagnosis not present

## 2020-09-26 DIAGNOSIS — K219 Gastro-esophageal reflux disease without esophagitis: Secondary | ICD-10-CM | POA: Diagnosis not present

## 2020-09-26 DIAGNOSIS — M8580 Other specified disorders of bone density and structure, unspecified site: Secondary | ICD-10-CM | POA: Diagnosis not present

## 2020-09-26 DIAGNOSIS — E1151 Type 2 diabetes mellitus with diabetic peripheral angiopathy without gangrene: Secondary | ICD-10-CM | POA: Diagnosis not present

## 2020-10-18 DIAGNOSIS — G8929 Other chronic pain: Secondary | ICD-10-CM | POA: Diagnosis not present

## 2020-10-18 DIAGNOSIS — I129 Hypertensive chronic kidney disease with stage 1 through stage 4 chronic kidney disease, or unspecified chronic kidney disease: Secondary | ICD-10-CM | POA: Diagnosis not present

## 2020-10-18 DIAGNOSIS — N1831 Chronic kidney disease, stage 3a: Secondary | ICD-10-CM | POA: Diagnosis not present

## 2020-10-18 DIAGNOSIS — E1151 Type 2 diabetes mellitus with diabetic peripheral angiopathy without gangrene: Secondary | ICD-10-CM | POA: Diagnosis not present

## 2020-10-18 DIAGNOSIS — M545 Low back pain, unspecified: Secondary | ICD-10-CM | POA: Diagnosis not present

## 2020-10-29 DIAGNOSIS — K648 Other hemorrhoids: Secondary | ICD-10-CM | POA: Diagnosis not present

## 2020-10-29 DIAGNOSIS — K621 Rectal polyp: Secondary | ICD-10-CM | POA: Diagnosis not present

## 2020-10-29 DIAGNOSIS — Z86010 Personal history of colonic polyps: Secondary | ICD-10-CM | POA: Diagnosis not present

## 2020-10-29 DIAGNOSIS — D120 Benign neoplasm of cecum: Secondary | ICD-10-CM | POA: Diagnosis not present

## 2020-10-29 DIAGNOSIS — K635 Polyp of colon: Secondary | ICD-10-CM | POA: Diagnosis not present

## 2020-11-01 DIAGNOSIS — K621 Rectal polyp: Secondary | ICD-10-CM | POA: Diagnosis not present

## 2020-11-01 DIAGNOSIS — D120 Benign neoplasm of cecum: Secondary | ICD-10-CM | POA: Diagnosis not present

## 2020-11-01 DIAGNOSIS — K635 Polyp of colon: Secondary | ICD-10-CM | POA: Diagnosis not present

## 2020-11-25 ENCOUNTER — Other Ambulatory Visit: Payer: Self-pay | Admitting: Geriatric Medicine

## 2020-11-25 DIAGNOSIS — I129 Hypertensive chronic kidney disease with stage 1 through stage 4 chronic kidney disease, or unspecified chronic kidney disease: Secondary | ICD-10-CM | POA: Diagnosis not present

## 2020-11-25 DIAGNOSIS — G8929 Other chronic pain: Secondary | ICD-10-CM | POA: Diagnosis not present

## 2020-11-25 DIAGNOSIS — E1151 Type 2 diabetes mellitus with diabetic peripheral angiopathy without gangrene: Secondary | ICD-10-CM | POA: Diagnosis not present

## 2020-11-25 DIAGNOSIS — E039 Hypothyroidism, unspecified: Secondary | ICD-10-CM | POA: Diagnosis not present

## 2020-11-25 DIAGNOSIS — E1142 Type 2 diabetes mellitus with diabetic polyneuropathy: Secondary | ICD-10-CM | POA: Diagnosis not present

## 2020-11-25 DIAGNOSIS — E7800 Pure hypercholesterolemia, unspecified: Secondary | ICD-10-CM | POA: Diagnosis not present

## 2020-11-25 DIAGNOSIS — M8580 Other specified disorders of bone density and structure, unspecified site: Secondary | ICD-10-CM | POA: Diagnosis not present

## 2020-11-25 DIAGNOSIS — N1831 Chronic kidney disease, stage 3a: Secondary | ICD-10-CM | POA: Diagnosis not present

## 2020-11-25 DIAGNOSIS — Z1231 Encounter for screening mammogram for malignant neoplasm of breast: Secondary | ICD-10-CM

## 2020-12-12 DIAGNOSIS — E1142 Type 2 diabetes mellitus with diabetic polyneuropathy: Secondary | ICD-10-CM | POA: Diagnosis not present

## 2020-12-12 DIAGNOSIS — Z7984 Long term (current) use of oral hypoglycemic drugs: Secondary | ICD-10-CM | POA: Diagnosis not present

## 2020-12-12 DIAGNOSIS — E279 Disorder of adrenal gland, unspecified: Secondary | ICD-10-CM | POA: Diagnosis not present

## 2020-12-12 DIAGNOSIS — M810 Age-related osteoporosis without current pathological fracture: Secondary | ICD-10-CM | POA: Diagnosis not present

## 2020-12-12 DIAGNOSIS — E1165 Type 2 diabetes mellitus with hyperglycemia: Secondary | ICD-10-CM | POA: Diagnosis not present

## 2020-12-12 DIAGNOSIS — E039 Hypothyroidism, unspecified: Secondary | ICD-10-CM | POA: Diagnosis not present

## 2021-01-16 ENCOUNTER — Other Ambulatory Visit: Payer: Self-pay

## 2021-01-16 ENCOUNTER — Ambulatory Visit
Admission: RE | Admit: 2021-01-16 | Discharge: 2021-01-16 | Disposition: A | Discharge status: Home / Self Care | Payer: Medicare Other | Source: Ambulatory Visit | Attending: Geriatric Medicine | Admitting: Geriatric Medicine

## 2021-01-16 DIAGNOSIS — Z1231 Encounter for screening mammogram for malignant neoplasm of breast: Secondary | ICD-10-CM

## 2021-01-28 DIAGNOSIS — N1831 Chronic kidney disease, stage 3a: Secondary | ICD-10-CM | POA: Diagnosis not present

## 2021-01-28 DIAGNOSIS — M8580 Other specified disorders of bone density and structure, unspecified site: Secondary | ICD-10-CM | POA: Diagnosis not present

## 2021-01-28 DIAGNOSIS — G8929 Other chronic pain: Secondary | ICD-10-CM | POA: Diagnosis not present

## 2021-01-28 DIAGNOSIS — E039 Hypothyroidism, unspecified: Secondary | ICD-10-CM | POA: Diagnosis not present

## 2021-01-28 DIAGNOSIS — I129 Hypertensive chronic kidney disease with stage 1 through stage 4 chronic kidney disease, or unspecified chronic kidney disease: Secondary | ICD-10-CM | POA: Diagnosis not present

## 2021-01-28 DIAGNOSIS — E1151 Type 2 diabetes mellitus with diabetic peripheral angiopathy without gangrene: Secondary | ICD-10-CM | POA: Diagnosis not present

## 2021-01-28 DIAGNOSIS — E1142 Type 2 diabetes mellitus with diabetic polyneuropathy: Secondary | ICD-10-CM | POA: Diagnosis not present

## 2021-01-28 DIAGNOSIS — E7800 Pure hypercholesterolemia, unspecified: Secondary | ICD-10-CM | POA: Diagnosis not present

## 2021-01-28 DIAGNOSIS — M810 Age-related osteoporosis without current pathological fracture: Secondary | ICD-10-CM | POA: Diagnosis not present

## 2021-03-14 DIAGNOSIS — Z23 Encounter for immunization: Secondary | ICD-10-CM | POA: Diagnosis not present

## 2021-03-14 DIAGNOSIS — E039 Hypothyroidism, unspecified: Secondary | ICD-10-CM | POA: Diagnosis not present

## 2021-03-14 DIAGNOSIS — E279 Disorder of adrenal gland, unspecified: Secondary | ICD-10-CM | POA: Diagnosis not present

## 2021-03-14 DIAGNOSIS — Z7984 Long term (current) use of oral hypoglycemic drugs: Secondary | ICD-10-CM | POA: Diagnosis not present

## 2021-03-14 DIAGNOSIS — E1142 Type 2 diabetes mellitus with diabetic polyneuropathy: Secondary | ICD-10-CM | POA: Diagnosis not present

## 2021-03-14 DIAGNOSIS — M810 Age-related osteoporosis without current pathological fracture: Secondary | ICD-10-CM | POA: Diagnosis not present

## 2021-03-19 DIAGNOSIS — D692 Other nonthrombocytopenic purpura: Secondary | ICD-10-CM | POA: Diagnosis not present

## 2021-03-19 DIAGNOSIS — D1801 Hemangioma of skin and subcutaneous tissue: Secondary | ICD-10-CM | POA: Diagnosis not present

## 2021-03-19 DIAGNOSIS — L570 Actinic keratosis: Secondary | ICD-10-CM | POA: Diagnosis not present

## 2021-03-19 DIAGNOSIS — L814 Other melanin hyperpigmentation: Secondary | ICD-10-CM | POA: Diagnosis not present

## 2021-03-19 DIAGNOSIS — L821 Other seborrheic keratosis: Secondary | ICD-10-CM | POA: Diagnosis not present

## 2021-03-19 DIAGNOSIS — L918 Other hypertrophic disorders of the skin: Secondary | ICD-10-CM | POA: Diagnosis not present

## 2021-03-19 DIAGNOSIS — D2239 Melanocytic nevi of other parts of face: Secondary | ICD-10-CM | POA: Diagnosis not present

## 2021-03-27 DIAGNOSIS — M810 Age-related osteoporosis without current pathological fracture: Secondary | ICD-10-CM | POA: Diagnosis not present

## 2021-03-27 DIAGNOSIS — E1151 Type 2 diabetes mellitus with diabetic peripheral angiopathy without gangrene: Secondary | ICD-10-CM | POA: Diagnosis not present

## 2021-03-27 DIAGNOSIS — N1831 Chronic kidney disease, stage 3a: Secondary | ICD-10-CM | POA: Diagnosis not present

## 2021-03-27 DIAGNOSIS — G8929 Other chronic pain: Secondary | ICD-10-CM | POA: Diagnosis not present

## 2021-03-27 DIAGNOSIS — M8580 Other specified disorders of bone density and structure, unspecified site: Secondary | ICD-10-CM | POA: Diagnosis not present

## 2021-03-27 DIAGNOSIS — E7800 Pure hypercholesterolemia, unspecified: Secondary | ICD-10-CM | POA: Diagnosis not present

## 2021-03-27 DIAGNOSIS — E039 Hypothyroidism, unspecified: Secondary | ICD-10-CM | POA: Diagnosis not present

## 2021-03-27 DIAGNOSIS — E1142 Type 2 diabetes mellitus with diabetic polyneuropathy: Secondary | ICD-10-CM | POA: Diagnosis not present

## 2021-03-27 DIAGNOSIS — I129 Hypertensive chronic kidney disease with stage 1 through stage 4 chronic kidney disease, or unspecified chronic kidney disease: Secondary | ICD-10-CM | POA: Diagnosis not present

## 2021-04-07 DIAGNOSIS — R300 Dysuria: Secondary | ICD-10-CM | POA: Diagnosis not present

## 2021-05-06 DIAGNOSIS — E1142 Type 2 diabetes mellitus with diabetic polyneuropathy: Secondary | ICD-10-CM | POA: Diagnosis not present

## 2021-05-06 DIAGNOSIS — I129 Hypertensive chronic kidney disease with stage 1 through stage 4 chronic kidney disease, or unspecified chronic kidney disease: Secondary | ICD-10-CM | POA: Diagnosis not present

## 2021-05-06 DIAGNOSIS — Z0000 Encounter for general adult medical examination without abnormal findings: Secondary | ICD-10-CM | POA: Diagnosis not present

## 2021-05-06 DIAGNOSIS — Z79899 Other long term (current) drug therapy: Secondary | ICD-10-CM | POA: Diagnosis not present

## 2021-05-06 DIAGNOSIS — N1831 Chronic kidney disease, stage 3a: Secondary | ICD-10-CM | POA: Diagnosis not present

## 2021-05-06 DIAGNOSIS — Z1389 Encounter for screening for other disorder: Secondary | ICD-10-CM | POA: Diagnosis not present

## 2021-05-06 DIAGNOSIS — E1151 Type 2 diabetes mellitus with diabetic peripheral angiopathy without gangrene: Secondary | ICD-10-CM | POA: Diagnosis not present

## 2021-05-06 DIAGNOSIS — M8588 Other specified disorders of bone density and structure, other site: Secondary | ICD-10-CM | POA: Diagnosis not present

## 2021-05-06 DIAGNOSIS — J029 Acute pharyngitis, unspecified: Secondary | ICD-10-CM | POA: Diagnosis not present

## 2021-05-06 DIAGNOSIS — E039 Hypothyroidism, unspecified: Secondary | ICD-10-CM | POA: Diagnosis not present

## 2021-05-06 DIAGNOSIS — E7800 Pure hypercholesterolemia, unspecified: Secondary | ICD-10-CM | POA: Diagnosis not present

## 2021-06-20 DIAGNOSIS — E1142 Type 2 diabetes mellitus with diabetic polyneuropathy: Secondary | ICD-10-CM | POA: Diagnosis not present

## 2021-06-20 DIAGNOSIS — N1831 Chronic kidney disease, stage 3a: Secondary | ICD-10-CM | POA: Diagnosis not present

## 2021-06-20 DIAGNOSIS — N179 Acute kidney failure, unspecified: Secondary | ICD-10-CM | POA: Diagnosis not present

## 2021-06-20 DIAGNOSIS — I129 Hypertensive chronic kidney disease with stage 1 through stage 4 chronic kidney disease, or unspecified chronic kidney disease: Secondary | ICD-10-CM | POA: Diagnosis not present

## 2021-07-14 DIAGNOSIS — E1142 Type 2 diabetes mellitus with diabetic polyneuropathy: Secondary | ICD-10-CM | POA: Diagnosis not present

## 2021-07-14 DIAGNOSIS — E279 Disorder of adrenal gland, unspecified: Secondary | ICD-10-CM | POA: Diagnosis not present

## 2021-07-14 DIAGNOSIS — E039 Hypothyroidism, unspecified: Secondary | ICD-10-CM | POA: Diagnosis not present

## 2021-07-14 DIAGNOSIS — M810 Age-related osteoporosis without current pathological fracture: Secondary | ICD-10-CM | POA: Diagnosis not present

## 2021-07-14 DIAGNOSIS — Z7984 Long term (current) use of oral hypoglycemic drugs: Secondary | ICD-10-CM | POA: Diagnosis not present

## 2021-07-21 DIAGNOSIS — N1831 Chronic kidney disease, stage 3a: Secondary | ICD-10-CM | POA: Diagnosis not present

## 2021-08-11 DIAGNOSIS — E7800 Pure hypercholesterolemia, unspecified: Secondary | ICD-10-CM | POA: Diagnosis not present

## 2021-08-11 DIAGNOSIS — E1142 Type 2 diabetes mellitus with diabetic polyneuropathy: Secondary | ICD-10-CM | POA: Diagnosis not present

## 2021-08-20 DIAGNOSIS — N1831 Chronic kidney disease, stage 3a: Secondary | ICD-10-CM | POA: Diagnosis not present

## 2021-08-20 DIAGNOSIS — E1142 Type 2 diabetes mellitus with diabetic polyneuropathy: Secondary | ICD-10-CM | POA: Diagnosis not present

## 2021-08-20 DIAGNOSIS — I700 Atherosclerosis of aorta: Secondary | ICD-10-CM | POA: Diagnosis not present

## 2021-08-20 DIAGNOSIS — E7800 Pure hypercholesterolemia, unspecified: Secondary | ICD-10-CM | POA: Diagnosis not present

## 2021-08-20 DIAGNOSIS — I129 Hypertensive chronic kidney disease with stage 1 through stage 4 chronic kidney disease, or unspecified chronic kidney disease: Secondary | ICD-10-CM | POA: Diagnosis not present

## 2021-09-05 ENCOUNTER — Ambulatory Visit
Admission: RE | Admit: 2021-09-05 | Discharge: 2021-09-05 | Disposition: A | Discharge status: Home / Self Care | Payer: Medicare Other | Source: Ambulatory Visit | Attending: Family Medicine | Admitting: Family Medicine

## 2021-09-05 ENCOUNTER — Other Ambulatory Visit: Payer: Self-pay | Admitting: Family Medicine

## 2021-09-05 DIAGNOSIS — S8992XA Unspecified injury of left lower leg, initial encounter: Secondary | ICD-10-CM

## 2021-09-05 DIAGNOSIS — M79605 Pain in left leg: Secondary | ICD-10-CM | POA: Diagnosis not present

## 2021-09-05 DIAGNOSIS — S6992XA Unspecified injury of left wrist, hand and finger(s), initial encounter: Secondary | ICD-10-CM

## 2021-09-05 DIAGNOSIS — Z96642 Presence of left artificial hip joint: Secondary | ICD-10-CM | POA: Diagnosis not present

## 2021-09-05 DIAGNOSIS — M25532 Pain in left wrist: Secondary | ICD-10-CM | POA: Diagnosis not present

## 2021-09-05 DIAGNOSIS — W101XXA Fall (on)(from) sidewalk curb, initial encounter: Secondary | ICD-10-CM | POA: Diagnosis not present

## 2021-09-05 DIAGNOSIS — S51802A Unspecified open wound of left forearm, initial encounter: Secondary | ICD-10-CM | POA: Diagnosis not present

## 2021-09-05 DIAGNOSIS — S6991XA Unspecified injury of right wrist, hand and finger(s), initial encounter: Secondary | ICD-10-CM

## 2021-09-05 DIAGNOSIS — M79641 Pain in right hand: Secondary | ICD-10-CM | POA: Diagnosis not present

## 2021-09-05 DIAGNOSIS — M19041 Primary osteoarthritis, right hand: Secondary | ICD-10-CM | POA: Diagnosis not present

## 2021-09-05 DIAGNOSIS — S52502A Unspecified fracture of the lower end of left radius, initial encounter for closed fracture: Secondary | ICD-10-CM | POA: Diagnosis not present

## 2021-09-05 DIAGNOSIS — M545 Low back pain, unspecified: Secondary | ICD-10-CM | POA: Diagnosis not present

## 2021-09-08 DIAGNOSIS — S52502A Unspecified fracture of the lower end of left radius, initial encounter for closed fracture: Secondary | ICD-10-CM | POA: Diagnosis not present

## 2021-09-08 DIAGNOSIS — G8918 Other acute postprocedural pain: Secondary | ICD-10-CM | POA: Diagnosis not present

## 2021-09-08 DIAGNOSIS — M25532 Pain in left wrist: Secondary | ICD-10-CM | POA: Diagnosis not present

## 2021-09-08 DIAGNOSIS — M79641 Pain in right hand: Secondary | ICD-10-CM | POA: Diagnosis not present

## 2021-09-11 DIAGNOSIS — M545 Low back pain, unspecified: Secondary | ICD-10-CM | POA: Diagnosis not present

## 2021-09-15 DIAGNOSIS — G8918 Other acute postprocedural pain: Secondary | ICD-10-CM | POA: Diagnosis not present

## 2021-09-15 DIAGNOSIS — S52502A Unspecified fracture of the lower end of left radius, initial encounter for closed fracture: Secondary | ICD-10-CM | POA: Diagnosis not present

## 2021-09-15 DIAGNOSIS — M79641 Pain in right hand: Secondary | ICD-10-CM | POA: Diagnosis not present

## 2021-09-15 DIAGNOSIS — M25532 Pain in left wrist: Secondary | ICD-10-CM | POA: Diagnosis not present

## 2021-09-16 DIAGNOSIS — E039 Hypothyroidism, unspecified: Secondary | ICD-10-CM | POA: Diagnosis not present

## 2021-09-16 DIAGNOSIS — S62102A Fracture of unspecified carpal bone, left wrist, initial encounter for closed fracture: Secondary | ICD-10-CM | POA: Diagnosis not present

## 2021-09-16 DIAGNOSIS — E1142 Type 2 diabetes mellitus with diabetic polyneuropathy: Secondary | ICD-10-CM | POA: Diagnosis not present

## 2021-09-16 DIAGNOSIS — M810 Age-related osteoporosis without current pathological fracture: Secondary | ICD-10-CM | POA: Diagnosis not present

## 2021-09-16 DIAGNOSIS — E279 Disorder of adrenal gland, unspecified: Secondary | ICD-10-CM | POA: Diagnosis not present

## 2021-09-19 ENCOUNTER — Other Ambulatory Visit: Payer: Self-pay | Admitting: Internal Medicine

## 2021-09-19 DIAGNOSIS — M810 Age-related osteoporosis without current pathological fracture: Secondary | ICD-10-CM

## 2021-09-29 DIAGNOSIS — M545 Low back pain, unspecified: Secondary | ICD-10-CM | POA: Diagnosis not present

## 2021-10-13 DIAGNOSIS — G8918 Other acute postprocedural pain: Secondary | ICD-10-CM | POA: Diagnosis not present

## 2021-10-13 DIAGNOSIS — M25532 Pain in left wrist: Secondary | ICD-10-CM | POA: Diagnosis not present

## 2021-10-13 DIAGNOSIS — S52502A Unspecified fracture of the lower end of left radius, initial encounter for closed fracture: Secondary | ICD-10-CM | POA: Diagnosis not present

## 2021-10-13 DIAGNOSIS — M79641 Pain in right hand: Secondary | ICD-10-CM | POA: Diagnosis not present

## 2021-10-24 DIAGNOSIS — M25532 Pain in left wrist: Secondary | ICD-10-CM | POA: Diagnosis not present

## 2021-11-05 DIAGNOSIS — N1831 Chronic kidney disease, stage 3a: Secondary | ICD-10-CM | POA: Diagnosis not present

## 2021-11-05 DIAGNOSIS — S8012XS Contusion of left lower leg, sequela: Secondary | ICD-10-CM | POA: Diagnosis not present

## 2021-11-05 DIAGNOSIS — E1142 Type 2 diabetes mellitus with diabetic polyneuropathy: Secondary | ICD-10-CM | POA: Diagnosis not present

## 2021-11-05 DIAGNOSIS — I129 Hypertensive chronic kidney disease with stage 1 through stage 4 chronic kidney disease, or unspecified chronic kidney disease: Secondary | ICD-10-CM | POA: Diagnosis not present

## 2021-11-24 DIAGNOSIS — S52502A Unspecified fracture of the lower end of left radius, initial encounter for closed fracture: Secondary | ICD-10-CM | POA: Diagnosis not present

## 2021-11-24 DIAGNOSIS — G5602 Carpal tunnel syndrome, left upper limb: Secondary | ICD-10-CM | POA: Diagnosis not present

## 2021-12-18 DIAGNOSIS — E1142 Type 2 diabetes mellitus with diabetic polyneuropathy: Secondary | ICD-10-CM | POA: Diagnosis not present

## 2021-12-18 DIAGNOSIS — E279 Disorder of adrenal gland, unspecified: Secondary | ICD-10-CM | POA: Diagnosis not present

## 2021-12-18 DIAGNOSIS — M810 Age-related osteoporosis without current pathological fracture: Secondary | ICD-10-CM | POA: Diagnosis not present

## 2021-12-18 DIAGNOSIS — B3731 Acute candidiasis of vulva and vagina: Secondary | ICD-10-CM | POA: Diagnosis not present

## 2021-12-18 DIAGNOSIS — E039 Hypothyroidism, unspecified: Secondary | ICD-10-CM | POA: Diagnosis not present

## 2021-12-22 DIAGNOSIS — S52502A Unspecified fracture of the lower end of left radius, initial encounter for closed fracture: Secondary | ICD-10-CM | POA: Diagnosis not present

## 2021-12-22 DIAGNOSIS — G5602 Carpal tunnel syndrome, left upper limb: Secondary | ICD-10-CM | POA: Diagnosis not present

## 2022-01-02 DIAGNOSIS — G5622 Lesion of ulnar nerve, left upper limb: Secondary | ICD-10-CM | POA: Diagnosis not present

## 2022-01-02 DIAGNOSIS — G5602 Carpal tunnel syndrome, left upper limb: Secondary | ICD-10-CM | POA: Diagnosis not present

## 2022-01-05 DIAGNOSIS — S52502A Unspecified fracture of the lower end of left radius, initial encounter for closed fracture: Secondary | ICD-10-CM | POA: Diagnosis not present

## 2022-01-05 DIAGNOSIS — G5602 Carpal tunnel syndrome, left upper limb: Secondary | ICD-10-CM | POA: Diagnosis not present

## 2022-01-09 DIAGNOSIS — G5602 Carpal tunnel syndrome, left upper limb: Secondary | ICD-10-CM | POA: Diagnosis not present

## 2022-01-22 DIAGNOSIS — G5602 Carpal tunnel syndrome, left upper limb: Secondary | ICD-10-CM | POA: Diagnosis not present

## 2022-02-03 ENCOUNTER — Telehealth: Payer: Self-pay

## 2022-02-05 DIAGNOSIS — I129 Hypertensive chronic kidney disease with stage 1 through stage 4 chronic kidney disease, or unspecified chronic kidney disease: Secondary | ICD-10-CM | POA: Diagnosis not present

## 2022-02-05 DIAGNOSIS — R600 Localized edema: Secondary | ICD-10-CM | POA: Diagnosis not present

## 2022-02-09 ENCOUNTER — Ambulatory Visit: Payer: Self-pay

## 2022-02-09 NOTE — Patient Outreach (Signed)
  Care Coordination   Initial Visit Note    Name: Sharon Lowe MRN: [id number] DOB: [birth year]  Sharon Lowe is a 76 y.o. year old female who sees Lajean Manes, MD for primary care. I spoke with  Sharon Lowe by phone today.  What matters to the patients health and wellness today?  Health Maintenance/Medications    Goals Addressed             This Visit's Progress    Health Maintenance       Care Coordination Interventions: Reviewed plan for disease management. Reports adhering to plan. Reports monitoring fasting blood sugars and blood pressure as advised. Attending medical appointments as scheduled. Reviewed medications. Reports managing well. Denies concerns r/t medication management. Expressed concerns r/t cost of Trulicity. Would like to discuss options for medication assistance with clinical pharmacist. Will forward message to pharmacist. Assessed social determinant of health barriers. Last AWV completed on 05/06/21. Reports pending appointment for next AWV in January 2024.         SDOH assessments and interventions completed:  Yes  SDOH Interventions Today    Flowsheet Row Most Recent Value  SDOH Interventions   Food Insecurity Interventions Intervention Not Indicated  Transportation Interventions Intervention Not Indicated        Care Coordination Interventions Activated:  Yes  Care Coordination Interventions:  Yes, provided   Follow up plan: Sharon Lowe will call for care coordination assistance as needed.  Encounter Outcome:  Pt. Visit Completed   [facility] [phone]

## 2022-02-09 NOTE — Patient Instructions (Signed)
Visit Information  Thank you for taking time to speak with me. Please do not hesitate to call if you require additional assistance.  Following are the goals we discussed today:   Goals Addressed             This Visit's Progress    Health Maintenance       Care Coordination Interventions: Reviewed plan for disease management. Reports adhering to plan. Reports monitoring fasting blood sugars and blood pressure as advised. Attending medical appointments as scheduled. Reviewed medications. Reports managing well. Denies concerns r/t medication management. Expressed concerns r/t cost of Trulicity. Would like to discuss options for medication assistance with clinical pharmacist. Will forward message to pharmacist. Assessed social determinant of health barriers. Last AWV completed on 05/06/21. Reports pending appointment for next AWV in January 2024.         Sharon Lowe verbalized understanding of information discussed during the telephonic outreach. Declined need for mailed instructions or resources.    Sharon Lowe will call for care coordination assistance as needed.  [facility] [phone]

## 2022-02-10 ENCOUNTER — Other Ambulatory Visit: Payer: Self-pay | Admitting: Geriatric Medicine

## 2022-02-10 DIAGNOSIS — Z1231 Encounter for screening mammogram for malignant neoplasm of breast: Secondary | ICD-10-CM

## 2022-03-10 DIAGNOSIS — I129 Hypertensive chronic kidney disease with stage 1 through stage 4 chronic kidney disease, or unspecified chronic kidney disease: Secondary | ICD-10-CM | POA: Diagnosis not present

## 2022-03-10 DIAGNOSIS — N1831 Chronic kidney disease, stage 3a: Secondary | ICD-10-CM | POA: Diagnosis not present

## 2022-03-20 DIAGNOSIS — E1142 Type 2 diabetes mellitus with diabetic polyneuropathy: Secondary | ICD-10-CM | POA: Diagnosis not present

## 2022-03-20 DIAGNOSIS — E039 Hypothyroidism, unspecified: Secondary | ICD-10-CM | POA: Diagnosis not present

## 2022-03-23 DIAGNOSIS — D2261 Melanocytic nevi of right upper limb, including shoulder: Secondary | ICD-10-CM | POA: Diagnosis not present

## 2022-03-23 DIAGNOSIS — C44529 Squamous cell carcinoma of skin of other part of trunk: Secondary | ICD-10-CM | POA: Diagnosis not present

## 2022-03-23 DIAGNOSIS — S80812A Abrasion, left lower leg, initial encounter: Secondary | ICD-10-CM | POA: Diagnosis not present

## 2022-03-23 DIAGNOSIS — D225 Melanocytic nevi of trunk: Secondary | ICD-10-CM | POA: Diagnosis not present

## 2022-03-23 DIAGNOSIS — L814 Other melanin hyperpigmentation: Secondary | ICD-10-CM | POA: Diagnosis not present

## 2022-03-23 DIAGNOSIS — D2271 Melanocytic nevi of right lower limb, including hip: Secondary | ICD-10-CM | POA: Diagnosis not present

## 2022-03-23 DIAGNOSIS — L820 Inflamed seborrheic keratosis: Secondary | ICD-10-CM | POA: Diagnosis not present

## 2022-03-23 DIAGNOSIS — L821 Other seborrheic keratosis: Secondary | ICD-10-CM | POA: Diagnosis not present

## 2022-03-23 DIAGNOSIS — D2262 Melanocytic nevi of left upper limb, including shoulder: Secondary | ICD-10-CM | POA: Diagnosis not present

## 2022-03-23 DIAGNOSIS — D2272 Melanocytic nevi of left lower limb, including hip: Secondary | ICD-10-CM | POA: Diagnosis not present

## 2022-03-25 ENCOUNTER — Ambulatory Visit
Admission: RE | Admit: 2022-03-25 | Discharge: 2022-03-25 | Disposition: A | Discharge status: Home / Self Care | Payer: Medicare Other | Source: Ambulatory Visit | Attending: Geriatric Medicine | Admitting: Geriatric Medicine

## 2022-03-25 ENCOUNTER — Ambulatory Visit
Admission: RE | Admit: 2022-03-25 | Discharge: 2022-03-25 | Disposition: A | Discharge status: Home / Self Care | Payer: Medicare Other | Source: Ambulatory Visit | Attending: Internal Medicine | Admitting: Internal Medicine

## 2022-03-25 DIAGNOSIS — M810 Age-related osteoporosis without current pathological fracture: Secondary | ICD-10-CM

## 2022-03-25 DIAGNOSIS — Z1231 Encounter for screening mammogram for malignant neoplasm of breast: Secondary | ICD-10-CM

## 2022-03-25 DIAGNOSIS — Z780 Asymptomatic menopausal state: Secondary | ICD-10-CM | POA: Diagnosis not present

## 2022-03-26 DIAGNOSIS — E279 Disorder of adrenal gland, unspecified: Secondary | ICD-10-CM | POA: Diagnosis not present

## 2022-03-26 DIAGNOSIS — L0390 Cellulitis, unspecified: Secondary | ICD-10-CM | POA: Diagnosis not present

## 2022-03-26 DIAGNOSIS — M810 Age-related osteoporosis without current pathological fracture: Secondary | ICD-10-CM | POA: Diagnosis not present

## 2022-03-26 DIAGNOSIS — E1142 Type 2 diabetes mellitus with diabetic polyneuropathy: Secondary | ICD-10-CM | POA: Diagnosis not present

## 2022-03-26 DIAGNOSIS — E039 Hypothyroidism, unspecified: Secondary | ICD-10-CM | POA: Diagnosis not present

## 2022-04-01 DIAGNOSIS — L0291 Cutaneous abscess, unspecified: Secondary | ICD-10-CM | POA: Diagnosis not present

## 2022-04-01 DIAGNOSIS — E1142 Type 2 diabetes mellitus with diabetic polyneuropathy: Secondary | ICD-10-CM | POA: Diagnosis not present

## 2022-04-09 DIAGNOSIS — L821 Other seborrheic keratosis: Secondary | ICD-10-CM | POA: Diagnosis not present

## 2022-04-09 DIAGNOSIS — L820 Inflamed seborrheic keratosis: Secondary | ICD-10-CM | POA: Diagnosis not present

## 2022-04-09 DIAGNOSIS — C44529 Squamous cell carcinoma of skin of other part of trunk: Secondary | ICD-10-CM | POA: Diagnosis not present

## 2022-04-09 DIAGNOSIS — M7981 Nontraumatic hematoma of soft tissue: Secondary | ICD-10-CM | POA: Diagnosis not present

## 2022-05-22 DIAGNOSIS — I129 Hypertensive chronic kidney disease with stage 1 through stage 4 chronic kidney disease, or unspecified chronic kidney disease: Secondary | ICD-10-CM | POA: Diagnosis not present

## 2022-05-22 DIAGNOSIS — I10 Essential (primary) hypertension: Secondary | ICD-10-CM | POA: Diagnosis not present

## 2022-07-13 DIAGNOSIS — E279 Disorder of adrenal gland, unspecified: Secondary | ICD-10-CM | POA: Diagnosis not present

## 2022-07-13 DIAGNOSIS — E1142 Type 2 diabetes mellitus with diabetic polyneuropathy: Secondary | ICD-10-CM | POA: Diagnosis not present

## 2022-07-13 DIAGNOSIS — E039 Hypothyroidism, unspecified: Secondary | ICD-10-CM | POA: Diagnosis not present

## 2022-07-13 DIAGNOSIS — M810 Age-related osteoporosis without current pathological fracture: Secondary | ICD-10-CM | POA: Diagnosis not present

## 2022-08-10 DIAGNOSIS — E1165 Type 2 diabetes mellitus with hyperglycemia: Secondary | ICD-10-CM | POA: Diagnosis not present

## 2022-08-10 DIAGNOSIS — I129 Hypertensive chronic kidney disease with stage 1 through stage 4 chronic kidney disease, or unspecified chronic kidney disease: Secondary | ICD-10-CM | POA: Diagnosis not present

## 2022-08-10 DIAGNOSIS — I700 Atherosclerosis of aorta: Secondary | ICD-10-CM | POA: Diagnosis not present

## 2022-08-10 DIAGNOSIS — L0390 Cellulitis, unspecified: Secondary | ICD-10-CM | POA: Diagnosis not present

## 2022-08-12 DIAGNOSIS — G5602 Carpal tunnel syndrome, left upper limb: Secondary | ICD-10-CM | POA: Diagnosis not present

## 2022-09-22 ENCOUNTER — Other Ambulatory Visit: Payer: Self-pay | Admitting: Internal Medicine

## 2022-09-22 DIAGNOSIS — Z87891 Personal history of nicotine dependence: Secondary | ICD-10-CM

## 2022-09-22 DIAGNOSIS — I129 Hypertensive chronic kidney disease with stage 1 through stage 4 chronic kidney disease, or unspecified chronic kidney disease: Secondary | ICD-10-CM | POA: Diagnosis not present

## 2022-10-08 DIAGNOSIS — M5459 Other low back pain: Secondary | ICD-10-CM | POA: Diagnosis not present

## 2022-10-08 DIAGNOSIS — M25552 Pain in left hip: Secondary | ICD-10-CM | POA: Diagnosis not present

## 2022-10-13 DIAGNOSIS — M5416 Radiculopathy, lumbar region: Secondary | ICD-10-CM | POA: Diagnosis not present

## 2022-10-15 DIAGNOSIS — M5451 Vertebrogenic low back pain: Secondary | ICD-10-CM | POA: Diagnosis not present

## 2022-10-27 ENCOUNTER — Ambulatory Visit
Admission: RE | Admit: 2022-10-27 | Discharge: 2022-10-27 | Disposition: A | Discharge status: Home / Self Care | Payer: Medicare Other | Source: Ambulatory Visit | Attending: Internal Medicine | Admitting: Internal Medicine

## 2022-10-27 DIAGNOSIS — Z87891 Personal history of nicotine dependence: Secondary | ICD-10-CM

## 2022-11-03 DIAGNOSIS — M5416 Radiculopathy, lumbar region: Secondary | ICD-10-CM | POA: Diagnosis not present

## 2022-11-03 DIAGNOSIS — M48062 Spinal stenosis, lumbar region with neurogenic claudication: Secondary | ICD-10-CM | POA: Diagnosis not present

## 2022-11-17 DIAGNOSIS — M5451 Vertebrogenic low back pain: Secondary | ICD-10-CM | POA: Diagnosis not present

## 2022-11-20 DIAGNOSIS — N390 Urinary tract infection, site not specified: Secondary | ICD-10-CM | POA: Diagnosis not present

## 2022-11-25 DIAGNOSIS — M48062 Spinal stenosis, lumbar region with neurogenic claudication: Secondary | ICD-10-CM | POA: Diagnosis not present

## 2022-11-25 DIAGNOSIS — M810 Age-related osteoporosis without current pathological fracture: Secondary | ICD-10-CM | POA: Diagnosis not present

## 2022-12-16 ENCOUNTER — Other Ambulatory Visit: Payer: Self-pay | Admitting: Neurosurgery

## 2022-12-16 DIAGNOSIS — S32038A Other fracture of third lumbar vertebra, initial encounter for closed fracture: Secondary | ICD-10-CM

## 2022-12-18 DIAGNOSIS — N390 Urinary tract infection, site not specified: Secondary | ICD-10-CM | POA: Diagnosis not present

## 2023-01-06 ENCOUNTER — Ambulatory Visit
Admission: RE | Admit: 2023-01-06 | Discharge: 2023-01-06 | Disposition: A | Discharge status: Home / Self Care | Payer: Medicare Other | Source: Ambulatory Visit | Attending: Neurosurgery | Admitting: Neurosurgery

## 2023-01-06 DIAGNOSIS — M549 Dorsalgia, unspecified: Secondary | ICD-10-CM | POA: Diagnosis not present

## 2023-01-06 DIAGNOSIS — M4854XD Collapsed vertebra, not elsewhere classified, thoracic region, subsequent encounter for fracture with routine healing: Secondary | ICD-10-CM | POA: Diagnosis not present

## 2023-01-06 DIAGNOSIS — M4856XD Collapsed vertebra, not elsewhere classified, lumbar region, subsequent encounter for fracture with routine healing: Secondary | ICD-10-CM | POA: Diagnosis not present

## 2023-01-06 DIAGNOSIS — S32038A Other fracture of third lumbar vertebra, initial encounter for closed fracture: Secondary | ICD-10-CM

## 2023-01-13 DIAGNOSIS — S32010A Wedge compression fracture of first lumbar vertebra, initial encounter for closed fracture: Secondary | ICD-10-CM | POA: Diagnosis not present

## 2023-01-14 DIAGNOSIS — E039 Hypothyroidism, unspecified: Secondary | ICD-10-CM | POA: Diagnosis not present

## 2023-01-14 DIAGNOSIS — E1142 Type 2 diabetes mellitus with diabetic polyneuropathy: Secondary | ICD-10-CM | POA: Diagnosis not present

## 2023-01-14 DIAGNOSIS — E1165 Type 2 diabetes mellitus with hyperglycemia: Secondary | ICD-10-CM | POA: Diagnosis not present

## 2023-01-14 DIAGNOSIS — E279 Disorder of adrenal gland, unspecified: Secondary | ICD-10-CM | POA: Diagnosis not present

## 2023-01-14 DIAGNOSIS — D649 Anemia, unspecified: Secondary | ICD-10-CM | POA: Diagnosis not present

## 2023-01-14 DIAGNOSIS — T300 Burn of unspecified body region, unspecified degree: Secondary | ICD-10-CM | POA: Diagnosis not present

## 2023-01-14 DIAGNOSIS — Z8781 Personal history of (healed) traumatic fracture: Secondary | ICD-10-CM | POA: Diagnosis not present

## 2023-01-14 DIAGNOSIS — M810 Age-related osteoporosis without current pathological fracture: Secondary | ICD-10-CM | POA: Diagnosis not present

## 2023-01-18 DIAGNOSIS — E1142 Type 2 diabetes mellitus with diabetic polyneuropathy: Secondary | ICD-10-CM | POA: Diagnosis not present

## 2023-01-18 DIAGNOSIS — I129 Hypertensive chronic kidney disease with stage 1 through stage 4 chronic kidney disease, or unspecified chronic kidney disease: Secondary | ICD-10-CM | POA: Diagnosis not present

## 2023-01-18 DIAGNOSIS — E1122 Type 2 diabetes mellitus with diabetic chronic kidney disease: Secondary | ICD-10-CM | POA: Diagnosis not present

## 2023-01-18 DIAGNOSIS — E1165 Type 2 diabetes mellitus with hyperglycemia: Secondary | ICD-10-CM | POA: Diagnosis not present

## 2023-01-18 DIAGNOSIS — N1831 Chronic kidney disease, stage 3a: Secondary | ICD-10-CM | POA: Diagnosis not present

## 2023-01-18 DIAGNOSIS — S21209A Unspecified open wound of unspecified back wall of thorax without penetration into thoracic cavity, initial encounter: Secondary | ICD-10-CM | POA: Diagnosis not present

## 2023-01-18 DIAGNOSIS — L0390 Cellulitis, unspecified: Secondary | ICD-10-CM | POA: Diagnosis not present

## 2023-01-18 DIAGNOSIS — I700 Atherosclerosis of aorta: Secondary | ICD-10-CM | POA: Diagnosis not present

## 2023-01-19 DIAGNOSIS — Z9181 History of falling: Secondary | ICD-10-CM | POA: Diagnosis not present

## 2023-01-19 DIAGNOSIS — W19XXXD Unspecified fall, subsequent encounter: Secondary | ICD-10-CM | POA: Diagnosis not present

## 2023-01-19 DIAGNOSIS — E1142 Type 2 diabetes mellitus with diabetic polyneuropathy: Secondary | ICD-10-CM | POA: Diagnosis not present

## 2023-01-19 DIAGNOSIS — E1122 Type 2 diabetes mellitus with diabetic chronic kidney disease: Secondary | ICD-10-CM | POA: Diagnosis not present

## 2023-01-19 DIAGNOSIS — Z87891 Personal history of nicotine dependence: Secondary | ICD-10-CM | POA: Diagnosis not present

## 2023-01-19 DIAGNOSIS — I129 Hypertensive chronic kidney disease with stage 1 through stage 4 chronic kidney disease, or unspecified chronic kidney disease: Secondary | ICD-10-CM | POA: Diagnosis not present

## 2023-01-19 DIAGNOSIS — Z7984 Long term (current) use of oral hypoglycemic drugs: Secondary | ICD-10-CM | POA: Diagnosis not present

## 2023-01-19 DIAGNOSIS — L0390 Cellulitis, unspecified: Secondary | ICD-10-CM | POA: Diagnosis not present

## 2023-01-19 DIAGNOSIS — L89151 Pressure ulcer of sacral region, stage 1: Secondary | ICD-10-CM | POA: Diagnosis not present

## 2023-01-19 DIAGNOSIS — X19XXXD Contact with other heat and hot substances, subsequent encounter: Secondary | ICD-10-CM | POA: Diagnosis not present

## 2023-01-19 DIAGNOSIS — D509 Iron deficiency anemia, unspecified: Secondary | ICD-10-CM | POA: Diagnosis not present

## 2023-01-19 DIAGNOSIS — T2103XD Burn of unspecified degree of upper back, subsequent encounter: Secondary | ICD-10-CM | POA: Diagnosis not present

## 2023-01-19 DIAGNOSIS — L98411 Non-pressure chronic ulcer of buttock limited to breakdown of skin: Secondary | ICD-10-CM | POA: Diagnosis not present

## 2023-01-19 DIAGNOSIS — E785 Hyperlipidemia, unspecified: Secondary | ICD-10-CM | POA: Diagnosis not present

## 2023-01-19 DIAGNOSIS — E039 Hypothyroidism, unspecified: Secondary | ICD-10-CM | POA: Diagnosis not present

## 2023-01-19 DIAGNOSIS — N1831 Chronic kidney disease, stage 3a: Secondary | ICD-10-CM | POA: Diagnosis not present

## 2023-01-19 DIAGNOSIS — E1165 Type 2 diabetes mellitus with hyperglycemia: Secondary | ICD-10-CM | POA: Diagnosis not present

## 2023-01-19 DIAGNOSIS — G8929 Other chronic pain: Secondary | ICD-10-CM | POA: Diagnosis not present

## 2023-01-19 DIAGNOSIS — M8008XD Age-related osteoporosis with current pathological fracture, vertebra(e), subsequent encounter for fracture with routine healing: Secondary | ICD-10-CM | POA: Diagnosis not present

## 2023-01-19 DIAGNOSIS — I700 Atherosclerosis of aorta: Secondary | ICD-10-CM | POA: Diagnosis not present

## 2023-01-19 DIAGNOSIS — L89892 Pressure ulcer of other site, stage 2: Secondary | ICD-10-CM | POA: Diagnosis not present

## 2023-01-19 DIAGNOSIS — Z794 Long term (current) use of insulin: Secondary | ICD-10-CM | POA: Diagnosis not present

## 2023-01-21 DIAGNOSIS — T2103XD Burn of unspecified degree of upper back, subsequent encounter: Secondary | ICD-10-CM | POA: Diagnosis not present

## 2023-01-21 DIAGNOSIS — W19XXXD Unspecified fall, subsequent encounter: Secondary | ICD-10-CM | POA: Diagnosis not present

## 2023-01-21 DIAGNOSIS — E1142 Type 2 diabetes mellitus with diabetic polyneuropathy: Secondary | ICD-10-CM | POA: Diagnosis not present

## 2023-01-21 DIAGNOSIS — E1165 Type 2 diabetes mellitus with hyperglycemia: Secondary | ICD-10-CM | POA: Diagnosis not present

## 2023-01-21 DIAGNOSIS — E1122 Type 2 diabetes mellitus with diabetic chronic kidney disease: Secondary | ICD-10-CM | POA: Diagnosis not present

## 2023-01-21 DIAGNOSIS — E785 Hyperlipidemia, unspecified: Secondary | ICD-10-CM | POA: Diagnosis not present

## 2023-01-21 DIAGNOSIS — I129 Hypertensive chronic kidney disease with stage 1 through stage 4 chronic kidney disease, or unspecified chronic kidney disease: Secondary | ICD-10-CM | POA: Diagnosis not present

## 2023-01-21 DIAGNOSIS — E039 Hypothyroidism, unspecified: Secondary | ICD-10-CM | POA: Diagnosis not present

## 2023-01-21 DIAGNOSIS — G8929 Other chronic pain: Secondary | ICD-10-CM | POA: Diagnosis not present

## 2023-01-21 DIAGNOSIS — M8008XD Age-related osteoporosis with current pathological fracture, vertebra(e), subsequent encounter for fracture with routine healing: Secondary | ICD-10-CM | POA: Diagnosis not present

## 2023-01-21 DIAGNOSIS — L0390 Cellulitis, unspecified: Secondary | ICD-10-CM | POA: Diagnosis not present

## 2023-01-21 DIAGNOSIS — I700 Atherosclerosis of aorta: Secondary | ICD-10-CM | POA: Diagnosis not present

## 2023-01-21 DIAGNOSIS — N1831 Chronic kidney disease, stage 3a: Secondary | ICD-10-CM | POA: Diagnosis not present

## 2023-01-21 DIAGNOSIS — Z794 Long term (current) use of insulin: Secondary | ICD-10-CM | POA: Diagnosis not present

## 2023-01-21 DIAGNOSIS — L89892 Pressure ulcer of other site, stage 2: Secondary | ICD-10-CM | POA: Diagnosis not present

## 2023-01-21 DIAGNOSIS — D509 Iron deficiency anemia, unspecified: Secondary | ICD-10-CM | POA: Diagnosis not present

## 2023-01-21 DIAGNOSIS — Z87891 Personal history of nicotine dependence: Secondary | ICD-10-CM | POA: Diagnosis not present

## 2023-01-21 DIAGNOSIS — Z7984 Long term (current) use of oral hypoglycemic drugs: Secondary | ICD-10-CM | POA: Diagnosis not present

## 2023-01-21 DIAGNOSIS — X19XXXD Contact with other heat and hot substances, subsequent encounter: Secondary | ICD-10-CM | POA: Diagnosis not present

## 2023-01-21 DIAGNOSIS — L98411 Non-pressure chronic ulcer of buttock limited to breakdown of skin: Secondary | ICD-10-CM | POA: Diagnosis not present

## 2023-01-21 DIAGNOSIS — Z9181 History of falling: Secondary | ICD-10-CM | POA: Diagnosis not present

## 2023-01-21 DIAGNOSIS — L89151 Pressure ulcer of sacral region, stage 1: Secondary | ICD-10-CM | POA: Diagnosis not present

## 2023-01-22 DIAGNOSIS — L89151 Pressure ulcer of sacral region, stage 1: Secondary | ICD-10-CM | POA: Diagnosis not present

## 2023-01-22 DIAGNOSIS — I129 Hypertensive chronic kidney disease with stage 1 through stage 4 chronic kidney disease, or unspecified chronic kidney disease: Secondary | ICD-10-CM | POA: Diagnosis not present

## 2023-01-22 DIAGNOSIS — M8008XD Age-related osteoporosis with current pathological fracture, vertebra(e), subsequent encounter for fracture with routine healing: Secondary | ICD-10-CM | POA: Diagnosis not present

## 2023-01-22 DIAGNOSIS — I700 Atherosclerosis of aorta: Secondary | ICD-10-CM | POA: Diagnosis not present

## 2023-01-22 DIAGNOSIS — E1142 Type 2 diabetes mellitus with diabetic polyneuropathy: Secondary | ICD-10-CM | POA: Diagnosis not present

## 2023-01-22 DIAGNOSIS — G8929 Other chronic pain: Secondary | ICD-10-CM | POA: Diagnosis not present

## 2023-01-22 DIAGNOSIS — E1122 Type 2 diabetes mellitus with diabetic chronic kidney disease: Secondary | ICD-10-CM | POA: Diagnosis not present

## 2023-01-22 DIAGNOSIS — Z9181 History of falling: Secondary | ICD-10-CM | POA: Diagnosis not present

## 2023-01-22 DIAGNOSIS — T2103XD Burn of unspecified degree of upper back, subsequent encounter: Secondary | ICD-10-CM | POA: Diagnosis not present

## 2023-01-22 DIAGNOSIS — L0390 Cellulitis, unspecified: Secondary | ICD-10-CM | POA: Diagnosis not present

## 2023-01-22 DIAGNOSIS — E785 Hyperlipidemia, unspecified: Secondary | ICD-10-CM | POA: Diagnosis not present

## 2023-01-22 DIAGNOSIS — W19XXXD Unspecified fall, subsequent encounter: Secondary | ICD-10-CM | POA: Diagnosis not present

## 2023-01-22 DIAGNOSIS — N1831 Chronic kidney disease, stage 3a: Secondary | ICD-10-CM | POA: Diagnosis not present

## 2023-01-22 DIAGNOSIS — X19XXXD Contact with other heat and hot substances, subsequent encounter: Secondary | ICD-10-CM | POA: Diagnosis not present

## 2023-01-22 DIAGNOSIS — L89892 Pressure ulcer of other site, stage 2: Secondary | ICD-10-CM | POA: Diagnosis not present

## 2023-01-22 DIAGNOSIS — Z87891 Personal history of nicotine dependence: Secondary | ICD-10-CM | POA: Diagnosis not present

## 2023-01-22 DIAGNOSIS — Z7984 Long term (current) use of oral hypoglycemic drugs: Secondary | ICD-10-CM | POA: Diagnosis not present

## 2023-01-22 DIAGNOSIS — E039 Hypothyroidism, unspecified: Secondary | ICD-10-CM | POA: Diagnosis not present

## 2023-01-22 DIAGNOSIS — Z794 Long term (current) use of insulin: Secondary | ICD-10-CM | POA: Diagnosis not present

## 2023-01-22 DIAGNOSIS — E1165 Type 2 diabetes mellitus with hyperglycemia: Secondary | ICD-10-CM | POA: Diagnosis not present

## 2023-01-22 DIAGNOSIS — L98411 Non-pressure chronic ulcer of buttock limited to breakdown of skin: Secondary | ICD-10-CM | POA: Diagnosis not present

## 2023-01-22 DIAGNOSIS — D509 Iron deficiency anemia, unspecified: Secondary | ICD-10-CM | POA: Diagnosis not present

## 2023-01-25 DIAGNOSIS — E039 Hypothyroidism, unspecified: Secondary | ICD-10-CM | POA: Diagnosis not present

## 2023-01-25 DIAGNOSIS — Z794 Long term (current) use of insulin: Secondary | ICD-10-CM | POA: Diagnosis not present

## 2023-01-25 DIAGNOSIS — X19XXXD Contact with other heat and hot substances, subsequent encounter: Secondary | ICD-10-CM | POA: Diagnosis not present

## 2023-01-25 DIAGNOSIS — E1165 Type 2 diabetes mellitus with hyperglycemia: Secondary | ICD-10-CM | POA: Diagnosis not present

## 2023-01-25 DIAGNOSIS — Z7984 Long term (current) use of oral hypoglycemic drugs: Secondary | ICD-10-CM | POA: Diagnosis not present

## 2023-01-25 DIAGNOSIS — L98411 Non-pressure chronic ulcer of buttock limited to breakdown of skin: Secondary | ICD-10-CM | POA: Diagnosis not present

## 2023-01-25 DIAGNOSIS — I129 Hypertensive chronic kidney disease with stage 1 through stage 4 chronic kidney disease, or unspecified chronic kidney disease: Secondary | ICD-10-CM | POA: Diagnosis not present

## 2023-01-25 DIAGNOSIS — E785 Hyperlipidemia, unspecified: Secondary | ICD-10-CM | POA: Diagnosis not present

## 2023-01-25 DIAGNOSIS — Z87891 Personal history of nicotine dependence: Secondary | ICD-10-CM | POA: Diagnosis not present

## 2023-01-25 DIAGNOSIS — E1142 Type 2 diabetes mellitus with diabetic polyneuropathy: Secondary | ICD-10-CM | POA: Diagnosis not present

## 2023-01-25 DIAGNOSIS — M8008XD Age-related osteoporosis with current pathological fracture, vertebra(e), subsequent encounter for fracture with routine healing: Secondary | ICD-10-CM | POA: Diagnosis not present

## 2023-01-25 DIAGNOSIS — G8929 Other chronic pain: Secondary | ICD-10-CM | POA: Diagnosis not present

## 2023-01-25 DIAGNOSIS — L89892 Pressure ulcer of other site, stage 2: Secondary | ICD-10-CM | POA: Diagnosis not present

## 2023-01-25 DIAGNOSIS — D509 Iron deficiency anemia, unspecified: Secondary | ICD-10-CM | POA: Diagnosis not present

## 2023-01-25 DIAGNOSIS — Z9181 History of falling: Secondary | ICD-10-CM | POA: Diagnosis not present

## 2023-01-25 DIAGNOSIS — N1831 Chronic kidney disease, stage 3a: Secondary | ICD-10-CM | POA: Diagnosis not present

## 2023-01-25 DIAGNOSIS — E1122 Type 2 diabetes mellitus with diabetic chronic kidney disease: Secondary | ICD-10-CM | POA: Diagnosis not present

## 2023-01-25 DIAGNOSIS — L89151 Pressure ulcer of sacral region, stage 1: Secondary | ICD-10-CM | POA: Diagnosis not present

## 2023-01-25 DIAGNOSIS — T2103XD Burn of unspecified degree of upper back, subsequent encounter: Secondary | ICD-10-CM | POA: Diagnosis not present

## 2023-01-25 DIAGNOSIS — I700 Atherosclerosis of aorta: Secondary | ICD-10-CM | POA: Diagnosis not present

## 2023-01-25 DIAGNOSIS — W19XXXD Unspecified fall, subsequent encounter: Secondary | ICD-10-CM | POA: Diagnosis not present

## 2023-01-25 DIAGNOSIS — L0390 Cellulitis, unspecified: Secondary | ICD-10-CM | POA: Diagnosis not present

## 2023-01-28 DIAGNOSIS — N1831 Chronic kidney disease, stage 3a: Secondary | ICD-10-CM | POA: Diagnosis not present

## 2023-01-28 DIAGNOSIS — I700 Atherosclerosis of aorta: Secondary | ICD-10-CM | POA: Diagnosis not present

## 2023-01-28 DIAGNOSIS — Z794 Long term (current) use of insulin: Secondary | ICD-10-CM | POA: Diagnosis not present

## 2023-01-28 DIAGNOSIS — X19XXXD Contact with other heat and hot substances, subsequent encounter: Secondary | ICD-10-CM | POA: Diagnosis not present

## 2023-01-28 DIAGNOSIS — L89151 Pressure ulcer of sacral region, stage 1: Secondary | ICD-10-CM | POA: Diagnosis not present

## 2023-01-28 DIAGNOSIS — E1165 Type 2 diabetes mellitus with hyperglycemia: Secondary | ICD-10-CM | POA: Diagnosis not present

## 2023-01-28 DIAGNOSIS — E1142 Type 2 diabetes mellitus with diabetic polyneuropathy: Secondary | ICD-10-CM | POA: Diagnosis not present

## 2023-01-28 DIAGNOSIS — D509 Iron deficiency anemia, unspecified: Secondary | ICD-10-CM | POA: Diagnosis not present

## 2023-01-28 DIAGNOSIS — E785 Hyperlipidemia, unspecified: Secondary | ICD-10-CM | POA: Diagnosis not present

## 2023-01-28 DIAGNOSIS — T2103XD Burn of unspecified degree of upper back, subsequent encounter: Secondary | ICD-10-CM | POA: Diagnosis not present

## 2023-01-28 DIAGNOSIS — L98411 Non-pressure chronic ulcer of buttock limited to breakdown of skin: Secondary | ICD-10-CM | POA: Diagnosis not present

## 2023-01-28 DIAGNOSIS — E039 Hypothyroidism, unspecified: Secondary | ICD-10-CM | POA: Diagnosis not present

## 2023-01-28 DIAGNOSIS — L89892 Pressure ulcer of other site, stage 2: Secondary | ICD-10-CM | POA: Diagnosis not present

## 2023-01-28 DIAGNOSIS — Z9181 History of falling: Secondary | ICD-10-CM | POA: Diagnosis not present

## 2023-01-28 DIAGNOSIS — I129 Hypertensive chronic kidney disease with stage 1 through stage 4 chronic kidney disease, or unspecified chronic kidney disease: Secondary | ICD-10-CM | POA: Diagnosis not present

## 2023-01-28 DIAGNOSIS — W19XXXD Unspecified fall, subsequent encounter: Secondary | ICD-10-CM | POA: Diagnosis not present

## 2023-01-28 DIAGNOSIS — M8008XD Age-related osteoporosis with current pathological fracture, vertebra(e), subsequent encounter for fracture with routine healing: Secondary | ICD-10-CM | POA: Diagnosis not present

## 2023-01-28 DIAGNOSIS — E1122 Type 2 diabetes mellitus with diabetic chronic kidney disease: Secondary | ICD-10-CM | POA: Diagnosis not present

## 2023-01-28 DIAGNOSIS — G8929 Other chronic pain: Secondary | ICD-10-CM | POA: Diagnosis not present

## 2023-01-28 DIAGNOSIS — L0390 Cellulitis, unspecified: Secondary | ICD-10-CM | POA: Diagnosis not present

## 2023-01-28 DIAGNOSIS — Z7984 Long term (current) use of oral hypoglycemic drugs: Secondary | ICD-10-CM | POA: Diagnosis not present

## 2023-01-28 DIAGNOSIS — Z87891 Personal history of nicotine dependence: Secondary | ICD-10-CM | POA: Diagnosis not present

## 2023-01-29 DIAGNOSIS — L0390 Cellulitis, unspecified: Secondary | ICD-10-CM | POA: Diagnosis not present

## 2023-01-29 DIAGNOSIS — T2103XD Burn of unspecified degree of upper back, subsequent encounter: Secondary | ICD-10-CM | POA: Diagnosis not present

## 2023-01-29 DIAGNOSIS — E039 Hypothyroidism, unspecified: Secondary | ICD-10-CM | POA: Diagnosis not present

## 2023-01-29 DIAGNOSIS — D509 Iron deficiency anemia, unspecified: Secondary | ICD-10-CM | POA: Diagnosis not present

## 2023-01-29 DIAGNOSIS — M8008XD Age-related osteoporosis with current pathological fracture, vertebra(e), subsequent encounter for fracture with routine healing: Secondary | ICD-10-CM | POA: Diagnosis not present

## 2023-01-29 DIAGNOSIS — N1831 Chronic kidney disease, stage 3a: Secondary | ICD-10-CM | POA: Diagnosis not present

## 2023-01-29 DIAGNOSIS — G8929 Other chronic pain: Secondary | ICD-10-CM | POA: Diagnosis not present

## 2023-01-29 DIAGNOSIS — I700 Atherosclerosis of aorta: Secondary | ICD-10-CM | POA: Diagnosis not present

## 2023-01-29 DIAGNOSIS — Z7984 Long term (current) use of oral hypoglycemic drugs: Secondary | ICD-10-CM | POA: Diagnosis not present

## 2023-01-29 DIAGNOSIS — E1165 Type 2 diabetes mellitus with hyperglycemia: Secondary | ICD-10-CM | POA: Diagnosis not present

## 2023-01-29 DIAGNOSIS — W19XXXD Unspecified fall, subsequent encounter: Secondary | ICD-10-CM | POA: Diagnosis not present

## 2023-01-29 DIAGNOSIS — L98411 Non-pressure chronic ulcer of buttock limited to breakdown of skin: Secondary | ICD-10-CM | POA: Diagnosis not present

## 2023-01-29 DIAGNOSIS — Z794 Long term (current) use of insulin: Secondary | ICD-10-CM | POA: Diagnosis not present

## 2023-01-29 DIAGNOSIS — E1122 Type 2 diabetes mellitus with diabetic chronic kidney disease: Secondary | ICD-10-CM | POA: Diagnosis not present

## 2023-01-29 DIAGNOSIS — E785 Hyperlipidemia, unspecified: Secondary | ICD-10-CM | POA: Diagnosis not present

## 2023-01-29 DIAGNOSIS — I129 Hypertensive chronic kidney disease with stage 1 through stage 4 chronic kidney disease, or unspecified chronic kidney disease: Secondary | ICD-10-CM | POA: Diagnosis not present

## 2023-01-29 DIAGNOSIS — Z87891 Personal history of nicotine dependence: Secondary | ICD-10-CM | POA: Diagnosis not present

## 2023-01-29 DIAGNOSIS — S21209A Unspecified open wound of unspecified back wall of thorax without penetration into thoracic cavity, initial encounter: Secondary | ICD-10-CM | POA: Diagnosis not present

## 2023-01-29 DIAGNOSIS — X19XXXD Contact with other heat and hot substances, subsequent encounter: Secondary | ICD-10-CM | POA: Diagnosis not present

## 2023-01-29 DIAGNOSIS — L89892 Pressure ulcer of other site, stage 2: Secondary | ICD-10-CM | POA: Diagnosis not present

## 2023-01-29 DIAGNOSIS — E1142 Type 2 diabetes mellitus with diabetic polyneuropathy: Secondary | ICD-10-CM | POA: Diagnosis not present

## 2023-01-29 DIAGNOSIS — L89151 Pressure ulcer of sacral region, stage 1: Secondary | ICD-10-CM | POA: Diagnosis not present

## 2023-01-29 DIAGNOSIS — Z9181 History of falling: Secondary | ICD-10-CM | POA: Diagnosis not present

## 2023-02-03 DIAGNOSIS — E039 Hypothyroidism, unspecified: Secondary | ICD-10-CM | POA: Diagnosis not present

## 2023-02-03 DIAGNOSIS — I129 Hypertensive chronic kidney disease with stage 1 through stage 4 chronic kidney disease, or unspecified chronic kidney disease: Secondary | ICD-10-CM | POA: Diagnosis not present

## 2023-02-03 DIAGNOSIS — L89151 Pressure ulcer of sacral region, stage 1: Secondary | ICD-10-CM | POA: Diagnosis not present

## 2023-02-03 DIAGNOSIS — E785 Hyperlipidemia, unspecified: Secondary | ICD-10-CM | POA: Diagnosis not present

## 2023-02-03 DIAGNOSIS — L0390 Cellulitis, unspecified: Secondary | ICD-10-CM | POA: Diagnosis not present

## 2023-02-03 DIAGNOSIS — Z9181 History of falling: Secondary | ICD-10-CM | POA: Diagnosis not present

## 2023-02-03 DIAGNOSIS — G8929 Other chronic pain: Secondary | ICD-10-CM | POA: Diagnosis not present

## 2023-02-03 DIAGNOSIS — X19XXXD Contact with other heat and hot substances, subsequent encounter: Secondary | ICD-10-CM | POA: Diagnosis not present

## 2023-02-03 DIAGNOSIS — N1831 Chronic kidney disease, stage 3a: Secondary | ICD-10-CM | POA: Diagnosis not present

## 2023-02-03 DIAGNOSIS — E1122 Type 2 diabetes mellitus with diabetic chronic kidney disease: Secondary | ICD-10-CM | POA: Diagnosis not present

## 2023-02-03 DIAGNOSIS — I700 Atherosclerosis of aorta: Secondary | ICD-10-CM | POA: Diagnosis not present

## 2023-02-03 DIAGNOSIS — L98411 Non-pressure chronic ulcer of buttock limited to breakdown of skin: Secondary | ICD-10-CM | POA: Diagnosis not present

## 2023-02-03 DIAGNOSIS — L89892 Pressure ulcer of other site, stage 2: Secondary | ICD-10-CM | POA: Diagnosis not present

## 2023-02-03 DIAGNOSIS — W19XXXD Unspecified fall, subsequent encounter: Secondary | ICD-10-CM | POA: Diagnosis not present

## 2023-02-03 DIAGNOSIS — Z7984 Long term (current) use of oral hypoglycemic drugs: Secondary | ICD-10-CM | POA: Diagnosis not present

## 2023-02-03 DIAGNOSIS — T2103XD Burn of unspecified degree of upper back, subsequent encounter: Secondary | ICD-10-CM | POA: Diagnosis not present

## 2023-02-03 DIAGNOSIS — E1142 Type 2 diabetes mellitus with diabetic polyneuropathy: Secondary | ICD-10-CM | POA: Diagnosis not present

## 2023-02-03 DIAGNOSIS — Z87891 Personal history of nicotine dependence: Secondary | ICD-10-CM | POA: Diagnosis not present

## 2023-02-03 DIAGNOSIS — Z794 Long term (current) use of insulin: Secondary | ICD-10-CM | POA: Diagnosis not present

## 2023-02-03 DIAGNOSIS — M8008XD Age-related osteoporosis with current pathological fracture, vertebra(e), subsequent encounter for fracture with routine healing: Secondary | ICD-10-CM | POA: Diagnosis not present

## 2023-02-03 DIAGNOSIS — E1165 Type 2 diabetes mellitus with hyperglycemia: Secondary | ICD-10-CM | POA: Diagnosis not present

## 2023-02-03 DIAGNOSIS — D509 Iron deficiency anemia, unspecified: Secondary | ICD-10-CM | POA: Diagnosis not present

## 2023-02-04 DIAGNOSIS — L89151 Pressure ulcer of sacral region, stage 1: Secondary | ICD-10-CM | POA: Diagnosis not present

## 2023-02-04 DIAGNOSIS — E039 Hypothyroidism, unspecified: Secondary | ICD-10-CM | POA: Diagnosis not present

## 2023-02-04 DIAGNOSIS — E1142 Type 2 diabetes mellitus with diabetic polyneuropathy: Secondary | ICD-10-CM | POA: Diagnosis not present

## 2023-02-04 DIAGNOSIS — X19XXXD Contact with other heat and hot substances, subsequent encounter: Secondary | ICD-10-CM | POA: Diagnosis not present

## 2023-02-04 DIAGNOSIS — Z7984 Long term (current) use of oral hypoglycemic drugs: Secondary | ICD-10-CM | POA: Diagnosis not present

## 2023-02-04 DIAGNOSIS — M8008XD Age-related osteoporosis with current pathological fracture, vertebra(e), subsequent encounter for fracture with routine healing: Secondary | ICD-10-CM | POA: Diagnosis not present

## 2023-02-04 DIAGNOSIS — L98411 Non-pressure chronic ulcer of buttock limited to breakdown of skin: Secondary | ICD-10-CM | POA: Diagnosis not present

## 2023-02-04 DIAGNOSIS — Z87891 Personal history of nicotine dependence: Secondary | ICD-10-CM | POA: Diagnosis not present

## 2023-02-04 DIAGNOSIS — G8929 Other chronic pain: Secondary | ICD-10-CM | POA: Diagnosis not present

## 2023-02-04 DIAGNOSIS — E785 Hyperlipidemia, unspecified: Secondary | ICD-10-CM | POA: Diagnosis not present

## 2023-02-04 DIAGNOSIS — E1165 Type 2 diabetes mellitus with hyperglycemia: Secondary | ICD-10-CM | POA: Diagnosis not present

## 2023-02-04 DIAGNOSIS — N1831 Chronic kidney disease, stage 3a: Secondary | ICD-10-CM | POA: Diagnosis not present

## 2023-02-04 DIAGNOSIS — E1122 Type 2 diabetes mellitus with diabetic chronic kidney disease: Secondary | ICD-10-CM | POA: Diagnosis not present

## 2023-02-04 DIAGNOSIS — T2103XD Burn of unspecified degree of upper back, subsequent encounter: Secondary | ICD-10-CM | POA: Diagnosis not present

## 2023-02-04 DIAGNOSIS — L0390 Cellulitis, unspecified: Secondary | ICD-10-CM | POA: Diagnosis not present

## 2023-02-04 DIAGNOSIS — Z9181 History of falling: Secondary | ICD-10-CM | POA: Diagnosis not present

## 2023-02-04 DIAGNOSIS — W19XXXD Unspecified fall, subsequent encounter: Secondary | ICD-10-CM | POA: Diagnosis not present

## 2023-02-04 DIAGNOSIS — L89892 Pressure ulcer of other site, stage 2: Secondary | ICD-10-CM | POA: Diagnosis not present

## 2023-02-04 DIAGNOSIS — Z794 Long term (current) use of insulin: Secondary | ICD-10-CM | POA: Diagnosis not present

## 2023-02-04 DIAGNOSIS — I129 Hypertensive chronic kidney disease with stage 1 through stage 4 chronic kidney disease, or unspecified chronic kidney disease: Secondary | ICD-10-CM | POA: Diagnosis not present

## 2023-02-04 DIAGNOSIS — I700 Atherosclerosis of aorta: Secondary | ICD-10-CM | POA: Diagnosis not present

## 2023-02-04 DIAGNOSIS — D509 Iron deficiency anemia, unspecified: Secondary | ICD-10-CM | POA: Diagnosis not present

## 2023-02-05 DIAGNOSIS — D509 Iron deficiency anemia, unspecified: Secondary | ICD-10-CM | POA: Diagnosis not present

## 2023-02-05 DIAGNOSIS — E039 Hypothyroidism, unspecified: Secondary | ICD-10-CM | POA: Diagnosis not present

## 2023-02-05 DIAGNOSIS — M8008XD Age-related osteoporosis with current pathological fracture, vertebra(e), subsequent encounter for fracture with routine healing: Secondary | ICD-10-CM | POA: Diagnosis not present

## 2023-02-05 DIAGNOSIS — L89151 Pressure ulcer of sacral region, stage 1: Secondary | ICD-10-CM | POA: Diagnosis not present

## 2023-02-05 DIAGNOSIS — E1122 Type 2 diabetes mellitus with diabetic chronic kidney disease: Secondary | ICD-10-CM | POA: Diagnosis not present

## 2023-02-05 DIAGNOSIS — Z7984 Long term (current) use of oral hypoglycemic drugs: Secondary | ICD-10-CM | POA: Diagnosis not present

## 2023-02-05 DIAGNOSIS — Z9181 History of falling: Secondary | ICD-10-CM | POA: Diagnosis not present

## 2023-02-05 DIAGNOSIS — G8929 Other chronic pain: Secondary | ICD-10-CM | POA: Diagnosis not present

## 2023-02-05 DIAGNOSIS — X19XXXD Contact with other heat and hot substances, subsequent encounter: Secondary | ICD-10-CM | POA: Diagnosis not present

## 2023-02-05 DIAGNOSIS — W19XXXD Unspecified fall, subsequent encounter: Secondary | ICD-10-CM | POA: Diagnosis not present

## 2023-02-05 DIAGNOSIS — N1831 Chronic kidney disease, stage 3a: Secondary | ICD-10-CM | POA: Diagnosis not present

## 2023-02-05 DIAGNOSIS — I700 Atherosclerosis of aorta: Secondary | ICD-10-CM | POA: Diagnosis not present

## 2023-02-05 DIAGNOSIS — Z794 Long term (current) use of insulin: Secondary | ICD-10-CM | POA: Diagnosis not present

## 2023-02-05 DIAGNOSIS — L98411 Non-pressure chronic ulcer of buttock limited to breakdown of skin: Secondary | ICD-10-CM | POA: Diagnosis not present

## 2023-02-05 DIAGNOSIS — Z87891 Personal history of nicotine dependence: Secondary | ICD-10-CM | POA: Diagnosis not present

## 2023-02-05 DIAGNOSIS — T2103XD Burn of unspecified degree of upper back, subsequent encounter: Secondary | ICD-10-CM | POA: Diagnosis not present

## 2023-02-05 DIAGNOSIS — E1142 Type 2 diabetes mellitus with diabetic polyneuropathy: Secondary | ICD-10-CM | POA: Diagnosis not present

## 2023-02-05 DIAGNOSIS — L0390 Cellulitis, unspecified: Secondary | ICD-10-CM | POA: Diagnosis not present

## 2023-02-05 DIAGNOSIS — E785 Hyperlipidemia, unspecified: Secondary | ICD-10-CM | POA: Diagnosis not present

## 2023-02-05 DIAGNOSIS — I129 Hypertensive chronic kidney disease with stage 1 through stage 4 chronic kidney disease, or unspecified chronic kidney disease: Secondary | ICD-10-CM | POA: Diagnosis not present

## 2023-02-05 DIAGNOSIS — L89892 Pressure ulcer of other site, stage 2: Secondary | ICD-10-CM | POA: Diagnosis not present

## 2023-02-05 DIAGNOSIS — E1165 Type 2 diabetes mellitus with hyperglycemia: Secondary | ICD-10-CM | POA: Diagnosis not present

## 2023-02-08 DIAGNOSIS — N1831 Chronic kidney disease, stage 3a: Secondary | ICD-10-CM | POA: Diagnosis not present

## 2023-02-08 DIAGNOSIS — Z9181 History of falling: Secondary | ICD-10-CM | POA: Diagnosis not present

## 2023-02-08 DIAGNOSIS — D509 Iron deficiency anemia, unspecified: Secondary | ICD-10-CM | POA: Diagnosis not present

## 2023-02-08 DIAGNOSIS — E039 Hypothyroidism, unspecified: Secondary | ICD-10-CM | POA: Diagnosis not present

## 2023-02-08 DIAGNOSIS — L0390 Cellulitis, unspecified: Secondary | ICD-10-CM | POA: Diagnosis not present

## 2023-02-08 DIAGNOSIS — Z794 Long term (current) use of insulin: Secondary | ICD-10-CM | POA: Diagnosis not present

## 2023-02-08 DIAGNOSIS — L89151 Pressure ulcer of sacral region, stage 1: Secondary | ICD-10-CM | POA: Diagnosis not present

## 2023-02-08 DIAGNOSIS — Z87891 Personal history of nicotine dependence: Secondary | ICD-10-CM | POA: Diagnosis not present

## 2023-02-08 DIAGNOSIS — L98411 Non-pressure chronic ulcer of buttock limited to breakdown of skin: Secondary | ICD-10-CM | POA: Diagnosis not present

## 2023-02-08 DIAGNOSIS — I129 Hypertensive chronic kidney disease with stage 1 through stage 4 chronic kidney disease, or unspecified chronic kidney disease: Secondary | ICD-10-CM | POA: Diagnosis not present

## 2023-02-08 DIAGNOSIS — E1165 Type 2 diabetes mellitus with hyperglycemia: Secondary | ICD-10-CM | POA: Diagnosis not present

## 2023-02-08 DIAGNOSIS — I700 Atherosclerosis of aorta: Secondary | ICD-10-CM | POA: Diagnosis not present

## 2023-02-08 DIAGNOSIS — T2103XD Burn of unspecified degree of upper back, subsequent encounter: Secondary | ICD-10-CM | POA: Diagnosis not present

## 2023-02-08 DIAGNOSIS — X19XXXD Contact with other heat and hot substances, subsequent encounter: Secondary | ICD-10-CM | POA: Diagnosis not present

## 2023-02-08 DIAGNOSIS — M8008XD Age-related osteoporosis with current pathological fracture, vertebra(e), subsequent encounter for fracture with routine healing: Secondary | ICD-10-CM | POA: Diagnosis not present

## 2023-02-08 DIAGNOSIS — W19XXXD Unspecified fall, subsequent encounter: Secondary | ICD-10-CM | POA: Diagnosis not present

## 2023-02-08 DIAGNOSIS — E1142 Type 2 diabetes mellitus with diabetic polyneuropathy: Secondary | ICD-10-CM | POA: Diagnosis not present

## 2023-02-08 DIAGNOSIS — E1122 Type 2 diabetes mellitus with diabetic chronic kidney disease: Secondary | ICD-10-CM | POA: Diagnosis not present

## 2023-02-08 DIAGNOSIS — Z7984 Long term (current) use of oral hypoglycemic drugs: Secondary | ICD-10-CM | POA: Diagnosis not present

## 2023-02-08 DIAGNOSIS — E785 Hyperlipidemia, unspecified: Secondary | ICD-10-CM | POA: Diagnosis not present

## 2023-02-08 DIAGNOSIS — L89892 Pressure ulcer of other site, stage 2: Secondary | ICD-10-CM | POA: Diagnosis not present

## 2023-02-08 DIAGNOSIS — G8929 Other chronic pain: Secondary | ICD-10-CM | POA: Diagnosis not present

## 2023-02-09 DIAGNOSIS — E1122 Type 2 diabetes mellitus with diabetic chronic kidney disease: Secondary | ICD-10-CM | POA: Diagnosis not present

## 2023-02-09 DIAGNOSIS — W19XXXD Unspecified fall, subsequent encounter: Secondary | ICD-10-CM | POA: Diagnosis not present

## 2023-02-09 DIAGNOSIS — M8008XD Age-related osteoporosis with current pathological fracture, vertebra(e), subsequent encounter for fracture with routine healing: Secondary | ICD-10-CM | POA: Diagnosis not present

## 2023-02-09 DIAGNOSIS — L89892 Pressure ulcer of other site, stage 2: Secondary | ICD-10-CM | POA: Diagnosis not present

## 2023-02-09 DIAGNOSIS — E1142 Type 2 diabetes mellitus with diabetic polyneuropathy: Secondary | ICD-10-CM | POA: Diagnosis not present

## 2023-02-09 DIAGNOSIS — E1165 Type 2 diabetes mellitus with hyperglycemia: Secondary | ICD-10-CM | POA: Diagnosis not present

## 2023-02-09 DIAGNOSIS — Z794 Long term (current) use of insulin: Secondary | ICD-10-CM | POA: Diagnosis not present

## 2023-02-09 DIAGNOSIS — D509 Iron deficiency anemia, unspecified: Secondary | ICD-10-CM | POA: Diagnosis not present

## 2023-02-09 DIAGNOSIS — L98411 Non-pressure chronic ulcer of buttock limited to breakdown of skin: Secondary | ICD-10-CM | POA: Diagnosis not present

## 2023-02-09 DIAGNOSIS — L89151 Pressure ulcer of sacral region, stage 1: Secondary | ICD-10-CM | POA: Diagnosis not present

## 2023-02-09 DIAGNOSIS — I700 Atherosclerosis of aorta: Secondary | ICD-10-CM | POA: Diagnosis not present

## 2023-02-09 DIAGNOSIS — I129 Hypertensive chronic kidney disease with stage 1 through stage 4 chronic kidney disease, or unspecified chronic kidney disease: Secondary | ICD-10-CM | POA: Diagnosis not present

## 2023-02-09 DIAGNOSIS — G8929 Other chronic pain: Secondary | ICD-10-CM | POA: Diagnosis not present

## 2023-02-09 DIAGNOSIS — L0390 Cellulitis, unspecified: Secondary | ICD-10-CM | POA: Diagnosis not present

## 2023-02-09 DIAGNOSIS — X19XXXD Contact with other heat and hot substances, subsequent encounter: Secondary | ICD-10-CM | POA: Diagnosis not present

## 2023-02-09 DIAGNOSIS — N1831 Chronic kidney disease, stage 3a: Secondary | ICD-10-CM | POA: Diagnosis not present

## 2023-02-09 DIAGNOSIS — E785 Hyperlipidemia, unspecified: Secondary | ICD-10-CM | POA: Diagnosis not present

## 2023-02-09 DIAGNOSIS — Z87891 Personal history of nicotine dependence: Secondary | ICD-10-CM | POA: Diagnosis not present

## 2023-02-09 DIAGNOSIS — Z7984 Long term (current) use of oral hypoglycemic drugs: Secondary | ICD-10-CM | POA: Diagnosis not present

## 2023-02-09 DIAGNOSIS — Z9181 History of falling: Secondary | ICD-10-CM | POA: Diagnosis not present

## 2023-02-09 DIAGNOSIS — T2103XD Burn of unspecified degree of upper back, subsequent encounter: Secondary | ICD-10-CM | POA: Diagnosis not present

## 2023-02-09 DIAGNOSIS — E039 Hypothyroidism, unspecified: Secondary | ICD-10-CM | POA: Diagnosis not present

## 2023-02-10 DIAGNOSIS — E785 Hyperlipidemia, unspecified: Secondary | ICD-10-CM | POA: Diagnosis not present

## 2023-02-10 DIAGNOSIS — E1122 Type 2 diabetes mellitus with diabetic chronic kidney disease: Secondary | ICD-10-CM | POA: Diagnosis not present

## 2023-02-10 DIAGNOSIS — I700 Atherosclerosis of aorta: Secondary | ICD-10-CM | POA: Diagnosis not present

## 2023-02-10 DIAGNOSIS — L89892 Pressure ulcer of other site, stage 2: Secondary | ICD-10-CM | POA: Diagnosis not present

## 2023-02-10 DIAGNOSIS — E1165 Type 2 diabetes mellitus with hyperglycemia: Secondary | ICD-10-CM | POA: Diagnosis not present

## 2023-02-10 DIAGNOSIS — L89151 Pressure ulcer of sacral region, stage 1: Secondary | ICD-10-CM | POA: Diagnosis not present

## 2023-02-10 DIAGNOSIS — I129 Hypertensive chronic kidney disease with stage 1 through stage 4 chronic kidney disease, or unspecified chronic kidney disease: Secondary | ICD-10-CM | POA: Diagnosis not present

## 2023-02-10 DIAGNOSIS — G8929 Other chronic pain: Secondary | ICD-10-CM | POA: Diagnosis not present

## 2023-02-10 DIAGNOSIS — N1831 Chronic kidney disease, stage 3a: Secondary | ICD-10-CM | POA: Diagnosis not present

## 2023-02-10 DIAGNOSIS — T2103XD Burn of unspecified degree of upper back, subsequent encounter: Secondary | ICD-10-CM | POA: Diagnosis not present

## 2023-02-10 DIAGNOSIS — W19XXXD Unspecified fall, subsequent encounter: Secondary | ICD-10-CM | POA: Diagnosis not present

## 2023-02-10 DIAGNOSIS — D509 Iron deficiency anemia, unspecified: Secondary | ICD-10-CM | POA: Diagnosis not present

## 2023-02-10 DIAGNOSIS — Z9181 History of falling: Secondary | ICD-10-CM | POA: Diagnosis not present

## 2023-02-10 DIAGNOSIS — L98411 Non-pressure chronic ulcer of buttock limited to breakdown of skin: Secondary | ICD-10-CM | POA: Diagnosis not present

## 2023-02-10 DIAGNOSIS — E039 Hypothyroidism, unspecified: Secondary | ICD-10-CM | POA: Diagnosis not present

## 2023-02-10 DIAGNOSIS — X19XXXD Contact with other heat and hot substances, subsequent encounter: Secondary | ICD-10-CM | POA: Diagnosis not present

## 2023-02-10 DIAGNOSIS — Z87891 Personal history of nicotine dependence: Secondary | ICD-10-CM | POA: Diagnosis not present

## 2023-02-10 DIAGNOSIS — M8008XD Age-related osteoporosis with current pathological fracture, vertebra(e), subsequent encounter for fracture with routine healing: Secondary | ICD-10-CM | POA: Diagnosis not present

## 2023-02-10 DIAGNOSIS — Z794 Long term (current) use of insulin: Secondary | ICD-10-CM | POA: Diagnosis not present

## 2023-02-10 DIAGNOSIS — L0390 Cellulitis, unspecified: Secondary | ICD-10-CM | POA: Diagnosis not present

## 2023-02-10 DIAGNOSIS — E1142 Type 2 diabetes mellitus with diabetic polyneuropathy: Secondary | ICD-10-CM | POA: Diagnosis not present

## 2023-02-10 DIAGNOSIS — Z7984 Long term (current) use of oral hypoglycemic drugs: Secondary | ICD-10-CM | POA: Diagnosis not present

## 2023-02-11 DIAGNOSIS — E1165 Type 2 diabetes mellitus with hyperglycemia: Secondary | ICD-10-CM | POA: Diagnosis not present

## 2023-02-11 DIAGNOSIS — L89892 Pressure ulcer of other site, stage 2: Secondary | ICD-10-CM | POA: Diagnosis not present

## 2023-02-11 DIAGNOSIS — Z794 Long term (current) use of insulin: Secondary | ICD-10-CM | POA: Diagnosis not present

## 2023-02-11 DIAGNOSIS — E785 Hyperlipidemia, unspecified: Secondary | ICD-10-CM | POA: Diagnosis not present

## 2023-02-11 DIAGNOSIS — T2103XD Burn of unspecified degree of upper back, subsequent encounter: Secondary | ICD-10-CM | POA: Diagnosis not present

## 2023-02-11 DIAGNOSIS — N1831 Chronic kidney disease, stage 3a: Secondary | ICD-10-CM | POA: Diagnosis not present

## 2023-02-11 DIAGNOSIS — L98411 Non-pressure chronic ulcer of buttock limited to breakdown of skin: Secondary | ICD-10-CM | POA: Diagnosis not present

## 2023-02-11 DIAGNOSIS — D509 Iron deficiency anemia, unspecified: Secondary | ICD-10-CM | POA: Diagnosis not present

## 2023-02-11 DIAGNOSIS — Z9181 History of falling: Secondary | ICD-10-CM | POA: Diagnosis not present

## 2023-02-11 DIAGNOSIS — L0390 Cellulitis, unspecified: Secondary | ICD-10-CM | POA: Diagnosis not present

## 2023-02-11 DIAGNOSIS — G8929 Other chronic pain: Secondary | ICD-10-CM | POA: Diagnosis not present

## 2023-02-11 DIAGNOSIS — Z7984 Long term (current) use of oral hypoglycemic drugs: Secondary | ICD-10-CM | POA: Diagnosis not present

## 2023-02-11 DIAGNOSIS — L89151 Pressure ulcer of sacral region, stage 1: Secondary | ICD-10-CM | POA: Diagnosis not present

## 2023-02-11 DIAGNOSIS — I129 Hypertensive chronic kidney disease with stage 1 through stage 4 chronic kidney disease, or unspecified chronic kidney disease: Secondary | ICD-10-CM | POA: Diagnosis not present

## 2023-02-11 DIAGNOSIS — E1142 Type 2 diabetes mellitus with diabetic polyneuropathy: Secondary | ICD-10-CM | POA: Diagnosis not present

## 2023-02-11 DIAGNOSIS — E1122 Type 2 diabetes mellitus with diabetic chronic kidney disease: Secondary | ICD-10-CM | POA: Diagnosis not present

## 2023-02-11 DIAGNOSIS — I700 Atherosclerosis of aorta: Secondary | ICD-10-CM | POA: Diagnosis not present

## 2023-02-11 DIAGNOSIS — M8008XD Age-related osteoporosis with current pathological fracture, vertebra(e), subsequent encounter for fracture with routine healing: Secondary | ICD-10-CM | POA: Diagnosis not present

## 2023-02-11 DIAGNOSIS — X19XXXD Contact with other heat and hot substances, subsequent encounter: Secondary | ICD-10-CM | POA: Diagnosis not present

## 2023-02-11 DIAGNOSIS — Z87891 Personal history of nicotine dependence: Secondary | ICD-10-CM | POA: Diagnosis not present

## 2023-02-11 DIAGNOSIS — E039 Hypothyroidism, unspecified: Secondary | ICD-10-CM | POA: Diagnosis not present

## 2023-02-11 DIAGNOSIS — W19XXXD Unspecified fall, subsequent encounter: Secondary | ICD-10-CM | POA: Diagnosis not present

## 2023-02-12 DIAGNOSIS — W19XXXD Unspecified fall, subsequent encounter: Secondary | ICD-10-CM | POA: Diagnosis not present

## 2023-02-12 DIAGNOSIS — Z9181 History of falling: Secondary | ICD-10-CM | POA: Diagnosis not present

## 2023-02-12 DIAGNOSIS — L89892 Pressure ulcer of other site, stage 2: Secondary | ICD-10-CM | POA: Diagnosis not present

## 2023-02-12 DIAGNOSIS — M8008XD Age-related osteoporosis with current pathological fracture, vertebra(e), subsequent encounter for fracture with routine healing: Secondary | ICD-10-CM | POA: Diagnosis not present

## 2023-02-12 DIAGNOSIS — X19XXXD Contact with other heat and hot substances, subsequent encounter: Secondary | ICD-10-CM | POA: Diagnosis not present

## 2023-02-12 DIAGNOSIS — E1142 Type 2 diabetes mellitus with diabetic polyneuropathy: Secondary | ICD-10-CM | POA: Diagnosis not present

## 2023-02-12 DIAGNOSIS — N1831 Chronic kidney disease, stage 3a: Secondary | ICD-10-CM | POA: Diagnosis not present

## 2023-02-12 DIAGNOSIS — E039 Hypothyroidism, unspecified: Secondary | ICD-10-CM | POA: Diagnosis not present

## 2023-02-12 DIAGNOSIS — I129 Hypertensive chronic kidney disease with stage 1 through stage 4 chronic kidney disease, or unspecified chronic kidney disease: Secondary | ICD-10-CM | POA: Diagnosis not present

## 2023-02-12 DIAGNOSIS — L98411 Non-pressure chronic ulcer of buttock limited to breakdown of skin: Secondary | ICD-10-CM | POA: Diagnosis not present

## 2023-02-12 DIAGNOSIS — L89151 Pressure ulcer of sacral region, stage 1: Secondary | ICD-10-CM | POA: Diagnosis not present

## 2023-02-12 DIAGNOSIS — Z794 Long term (current) use of insulin: Secondary | ICD-10-CM | POA: Diagnosis not present

## 2023-02-12 DIAGNOSIS — G8929 Other chronic pain: Secondary | ICD-10-CM | POA: Diagnosis not present

## 2023-02-12 DIAGNOSIS — E1122 Type 2 diabetes mellitus with diabetic chronic kidney disease: Secondary | ICD-10-CM | POA: Diagnosis not present

## 2023-02-12 DIAGNOSIS — E1165 Type 2 diabetes mellitus with hyperglycemia: Secondary | ICD-10-CM | POA: Diagnosis not present

## 2023-02-12 DIAGNOSIS — Z87891 Personal history of nicotine dependence: Secondary | ICD-10-CM | POA: Diagnosis not present

## 2023-02-12 DIAGNOSIS — L0390 Cellulitis, unspecified: Secondary | ICD-10-CM | POA: Diagnosis not present

## 2023-02-12 DIAGNOSIS — I700 Atherosclerosis of aorta: Secondary | ICD-10-CM | POA: Diagnosis not present

## 2023-02-12 DIAGNOSIS — T2103XD Burn of unspecified degree of upper back, subsequent encounter: Secondary | ICD-10-CM | POA: Diagnosis not present

## 2023-02-12 DIAGNOSIS — E785 Hyperlipidemia, unspecified: Secondary | ICD-10-CM | POA: Diagnosis not present

## 2023-02-12 DIAGNOSIS — Z7984 Long term (current) use of oral hypoglycemic drugs: Secondary | ICD-10-CM | POA: Diagnosis not present

## 2023-02-12 DIAGNOSIS — D509 Iron deficiency anemia, unspecified: Secondary | ICD-10-CM | POA: Diagnosis not present

## 2023-02-15 DIAGNOSIS — M48062 Spinal stenosis, lumbar region with neurogenic claudication: Secondary | ICD-10-CM | POA: Diagnosis not present

## 2023-02-15 DIAGNOSIS — Z9181 History of falling: Secondary | ICD-10-CM | POA: Diagnosis not present

## 2023-02-15 DIAGNOSIS — G894 Chronic pain syndrome: Secondary | ICD-10-CM | POA: Diagnosis not present

## 2023-02-15 DIAGNOSIS — M5459 Other low back pain: Secondary | ICD-10-CM | POA: Diagnosis not present

## 2023-02-15 DIAGNOSIS — Z79899 Other long term (current) drug therapy: Secondary | ICD-10-CM | POA: Diagnosis not present

## 2023-02-15 DIAGNOSIS — M5416 Radiculopathy, lumbar region: Secondary | ICD-10-CM | POA: Diagnosis not present

## 2023-02-16 DIAGNOSIS — D509 Iron deficiency anemia, unspecified: Secondary | ICD-10-CM | POA: Diagnosis not present

## 2023-02-16 DIAGNOSIS — L89151 Pressure ulcer of sacral region, stage 1: Secondary | ICD-10-CM | POA: Diagnosis not present

## 2023-02-16 DIAGNOSIS — G8929 Other chronic pain: Secondary | ICD-10-CM | POA: Diagnosis not present

## 2023-02-16 DIAGNOSIS — X19XXXD Contact with other heat and hot substances, subsequent encounter: Secondary | ICD-10-CM | POA: Diagnosis not present

## 2023-02-16 DIAGNOSIS — L0390 Cellulitis, unspecified: Secondary | ICD-10-CM | POA: Diagnosis not present

## 2023-02-16 DIAGNOSIS — L89892 Pressure ulcer of other site, stage 2: Secondary | ICD-10-CM | POA: Diagnosis not present

## 2023-02-16 DIAGNOSIS — E785 Hyperlipidemia, unspecified: Secondary | ICD-10-CM | POA: Diagnosis not present

## 2023-02-16 DIAGNOSIS — Z9181 History of falling: Secondary | ICD-10-CM | POA: Diagnosis not present

## 2023-02-16 DIAGNOSIS — T2103XD Burn of unspecified degree of upper back, subsequent encounter: Secondary | ICD-10-CM | POA: Diagnosis not present

## 2023-02-16 DIAGNOSIS — E1122 Type 2 diabetes mellitus with diabetic chronic kidney disease: Secondary | ICD-10-CM | POA: Diagnosis not present

## 2023-02-16 DIAGNOSIS — Z794 Long term (current) use of insulin: Secondary | ICD-10-CM | POA: Diagnosis not present

## 2023-02-16 DIAGNOSIS — Z7984 Long term (current) use of oral hypoglycemic drugs: Secondary | ICD-10-CM | POA: Diagnosis not present

## 2023-02-16 DIAGNOSIS — W19XXXD Unspecified fall, subsequent encounter: Secondary | ICD-10-CM | POA: Diagnosis not present

## 2023-02-16 DIAGNOSIS — I129 Hypertensive chronic kidney disease with stage 1 through stage 4 chronic kidney disease, or unspecified chronic kidney disease: Secondary | ICD-10-CM | POA: Diagnosis not present

## 2023-02-16 DIAGNOSIS — I700 Atherosclerosis of aorta: Secondary | ICD-10-CM | POA: Diagnosis not present

## 2023-02-16 DIAGNOSIS — L98411 Non-pressure chronic ulcer of buttock limited to breakdown of skin: Secondary | ICD-10-CM | POA: Diagnosis not present

## 2023-02-16 DIAGNOSIS — E1165 Type 2 diabetes mellitus with hyperglycemia: Secondary | ICD-10-CM | POA: Diagnosis not present

## 2023-02-16 DIAGNOSIS — E1142 Type 2 diabetes mellitus with diabetic polyneuropathy: Secondary | ICD-10-CM | POA: Diagnosis not present

## 2023-02-16 DIAGNOSIS — Z87891 Personal history of nicotine dependence: Secondary | ICD-10-CM | POA: Diagnosis not present

## 2023-02-16 DIAGNOSIS — E039 Hypothyroidism, unspecified: Secondary | ICD-10-CM | POA: Diagnosis not present

## 2023-02-16 DIAGNOSIS — M8008XD Age-related osteoporosis with current pathological fracture, vertebra(e), subsequent encounter for fracture with routine healing: Secondary | ICD-10-CM | POA: Diagnosis not present

## 2023-02-16 DIAGNOSIS — N1831 Chronic kidney disease, stage 3a: Secondary | ICD-10-CM | POA: Diagnosis not present

## 2023-02-17 DIAGNOSIS — S32010A Wedge compression fracture of first lumbar vertebra, initial encounter for closed fracture: Secondary | ICD-10-CM | POA: Diagnosis not present

## 2023-02-18 DIAGNOSIS — G8929 Other chronic pain: Secondary | ICD-10-CM | POA: Diagnosis not present

## 2023-02-18 DIAGNOSIS — Z794 Long term (current) use of insulin: Secondary | ICD-10-CM | POA: Diagnosis not present

## 2023-02-18 DIAGNOSIS — L89151 Pressure ulcer of sacral region, stage 1: Secondary | ICD-10-CM | POA: Diagnosis not present

## 2023-02-18 DIAGNOSIS — E785 Hyperlipidemia, unspecified: Secondary | ICD-10-CM | POA: Diagnosis not present

## 2023-02-18 DIAGNOSIS — Z7984 Long term (current) use of oral hypoglycemic drugs: Secondary | ICD-10-CM | POA: Diagnosis not present

## 2023-02-18 DIAGNOSIS — L0390 Cellulitis, unspecified: Secondary | ICD-10-CM | POA: Diagnosis not present

## 2023-02-18 DIAGNOSIS — E1142 Type 2 diabetes mellitus with diabetic polyneuropathy: Secondary | ICD-10-CM | POA: Diagnosis not present

## 2023-02-18 DIAGNOSIS — X19XXXD Contact with other heat and hot substances, subsequent encounter: Secondary | ICD-10-CM | POA: Diagnosis not present

## 2023-02-18 DIAGNOSIS — M8008XD Age-related osteoporosis with current pathological fracture, vertebra(e), subsequent encounter for fracture with routine healing: Secondary | ICD-10-CM | POA: Diagnosis not present

## 2023-02-18 DIAGNOSIS — D509 Iron deficiency anemia, unspecified: Secondary | ICD-10-CM | POA: Diagnosis not present

## 2023-02-18 DIAGNOSIS — E1122 Type 2 diabetes mellitus with diabetic chronic kidney disease: Secondary | ICD-10-CM | POA: Diagnosis not present

## 2023-02-18 DIAGNOSIS — N1831 Chronic kidney disease, stage 3a: Secondary | ICD-10-CM | POA: Diagnosis not present

## 2023-02-18 DIAGNOSIS — Z9181 History of falling: Secondary | ICD-10-CM | POA: Diagnosis not present

## 2023-02-18 DIAGNOSIS — I700 Atherosclerosis of aorta: Secondary | ICD-10-CM | POA: Diagnosis not present

## 2023-02-18 DIAGNOSIS — E1165 Type 2 diabetes mellitus with hyperglycemia: Secondary | ICD-10-CM | POA: Diagnosis not present

## 2023-02-18 DIAGNOSIS — I129 Hypertensive chronic kidney disease with stage 1 through stage 4 chronic kidney disease, or unspecified chronic kidney disease: Secondary | ICD-10-CM | POA: Diagnosis not present

## 2023-02-18 DIAGNOSIS — Z87891 Personal history of nicotine dependence: Secondary | ICD-10-CM | POA: Diagnosis not present

## 2023-02-18 DIAGNOSIS — E039 Hypothyroidism, unspecified: Secondary | ICD-10-CM | POA: Diagnosis not present

## 2023-02-18 DIAGNOSIS — L89892 Pressure ulcer of other site, stage 2: Secondary | ICD-10-CM | POA: Diagnosis not present

## 2023-02-18 DIAGNOSIS — T2103XD Burn of unspecified degree of upper back, subsequent encounter: Secondary | ICD-10-CM | POA: Diagnosis not present

## 2023-02-18 DIAGNOSIS — W19XXXD Unspecified fall, subsequent encounter: Secondary | ICD-10-CM | POA: Diagnosis not present

## 2023-02-18 DIAGNOSIS — L98411 Non-pressure chronic ulcer of buttock limited to breakdown of skin: Secondary | ICD-10-CM | POA: Diagnosis not present

## 2023-02-22 DIAGNOSIS — L89892 Pressure ulcer of other site, stage 2: Secondary | ICD-10-CM | POA: Diagnosis not present

## 2023-02-22 DIAGNOSIS — L98411 Non-pressure chronic ulcer of buttock limited to breakdown of skin: Secondary | ICD-10-CM | POA: Diagnosis not present

## 2023-02-22 DIAGNOSIS — E785 Hyperlipidemia, unspecified: Secondary | ICD-10-CM | POA: Diagnosis not present

## 2023-02-22 DIAGNOSIS — E1165 Type 2 diabetes mellitus with hyperglycemia: Secondary | ICD-10-CM | POA: Diagnosis not present

## 2023-02-22 DIAGNOSIS — Z9181 History of falling: Secondary | ICD-10-CM | POA: Diagnosis not present

## 2023-02-22 DIAGNOSIS — D509 Iron deficiency anemia, unspecified: Secondary | ICD-10-CM | POA: Diagnosis not present

## 2023-02-22 DIAGNOSIS — X19XXXD Contact with other heat and hot substances, subsequent encounter: Secondary | ICD-10-CM | POA: Diagnosis not present

## 2023-02-22 DIAGNOSIS — E1122 Type 2 diabetes mellitus with diabetic chronic kidney disease: Secondary | ICD-10-CM | POA: Diagnosis not present

## 2023-02-22 DIAGNOSIS — W19XXXD Unspecified fall, subsequent encounter: Secondary | ICD-10-CM | POA: Diagnosis not present

## 2023-02-22 DIAGNOSIS — N1831 Chronic kidney disease, stage 3a: Secondary | ICD-10-CM | POA: Diagnosis not present

## 2023-02-22 DIAGNOSIS — G8929 Other chronic pain: Secondary | ICD-10-CM | POA: Diagnosis not present

## 2023-02-22 DIAGNOSIS — L89151 Pressure ulcer of sacral region, stage 1: Secondary | ICD-10-CM | POA: Diagnosis not present

## 2023-02-22 DIAGNOSIS — Z87891 Personal history of nicotine dependence: Secondary | ICD-10-CM | POA: Diagnosis not present

## 2023-02-22 DIAGNOSIS — Z794 Long term (current) use of insulin: Secondary | ICD-10-CM | POA: Diagnosis not present

## 2023-02-22 DIAGNOSIS — I129 Hypertensive chronic kidney disease with stage 1 through stage 4 chronic kidney disease, or unspecified chronic kidney disease: Secondary | ICD-10-CM | POA: Diagnosis not present

## 2023-02-22 DIAGNOSIS — E039 Hypothyroidism, unspecified: Secondary | ICD-10-CM | POA: Diagnosis not present

## 2023-02-22 DIAGNOSIS — T2103XD Burn of unspecified degree of upper back, subsequent encounter: Secondary | ICD-10-CM | POA: Diagnosis not present

## 2023-02-22 DIAGNOSIS — L0390 Cellulitis, unspecified: Secondary | ICD-10-CM | POA: Diagnosis not present

## 2023-02-22 DIAGNOSIS — M8008XD Age-related osteoporosis with current pathological fracture, vertebra(e), subsequent encounter for fracture with routine healing: Secondary | ICD-10-CM | POA: Diagnosis not present

## 2023-02-22 DIAGNOSIS — Z7984 Long term (current) use of oral hypoglycemic drugs: Secondary | ICD-10-CM | POA: Diagnosis not present

## 2023-02-22 DIAGNOSIS — I700 Atherosclerosis of aorta: Secondary | ICD-10-CM | POA: Diagnosis not present

## 2023-02-22 DIAGNOSIS — E1142 Type 2 diabetes mellitus with diabetic polyneuropathy: Secondary | ICD-10-CM | POA: Diagnosis not present

## 2023-02-25 DIAGNOSIS — I129 Hypertensive chronic kidney disease with stage 1 through stage 4 chronic kidney disease, or unspecified chronic kidney disease: Secondary | ICD-10-CM | POA: Diagnosis not present

## 2023-02-25 DIAGNOSIS — E039 Hypothyroidism, unspecified: Secondary | ICD-10-CM | POA: Diagnosis not present

## 2023-02-25 DIAGNOSIS — L98411 Non-pressure chronic ulcer of buttock limited to breakdown of skin: Secondary | ICD-10-CM | POA: Diagnosis not present

## 2023-02-25 DIAGNOSIS — L0390 Cellulitis, unspecified: Secondary | ICD-10-CM | POA: Diagnosis not present

## 2023-02-25 DIAGNOSIS — L89892 Pressure ulcer of other site, stage 2: Secondary | ICD-10-CM | POA: Diagnosis not present

## 2023-02-25 DIAGNOSIS — X19XXXD Contact with other heat and hot substances, subsequent encounter: Secondary | ICD-10-CM | POA: Diagnosis not present

## 2023-02-25 DIAGNOSIS — E1142 Type 2 diabetes mellitus with diabetic polyneuropathy: Secondary | ICD-10-CM | POA: Diagnosis not present

## 2023-02-25 DIAGNOSIS — Z794 Long term (current) use of insulin: Secondary | ICD-10-CM | POA: Diagnosis not present

## 2023-02-25 DIAGNOSIS — G8929 Other chronic pain: Secondary | ICD-10-CM | POA: Diagnosis not present

## 2023-02-25 DIAGNOSIS — I700 Atherosclerosis of aorta: Secondary | ICD-10-CM | POA: Diagnosis not present

## 2023-02-25 DIAGNOSIS — E1122 Type 2 diabetes mellitus with diabetic chronic kidney disease: Secondary | ICD-10-CM | POA: Diagnosis not present

## 2023-02-25 DIAGNOSIS — W19XXXD Unspecified fall, subsequent encounter: Secondary | ICD-10-CM | POA: Diagnosis not present

## 2023-02-25 DIAGNOSIS — L89151 Pressure ulcer of sacral region, stage 1: Secondary | ICD-10-CM | POA: Diagnosis not present

## 2023-02-25 DIAGNOSIS — N1831 Chronic kidney disease, stage 3a: Secondary | ICD-10-CM | POA: Diagnosis not present

## 2023-02-25 DIAGNOSIS — Z7984 Long term (current) use of oral hypoglycemic drugs: Secondary | ICD-10-CM | POA: Diagnosis not present

## 2023-02-25 DIAGNOSIS — Z87891 Personal history of nicotine dependence: Secondary | ICD-10-CM | POA: Diagnosis not present

## 2023-02-25 DIAGNOSIS — M8008XD Age-related osteoporosis with current pathological fracture, vertebra(e), subsequent encounter for fracture with routine healing: Secondary | ICD-10-CM | POA: Diagnosis not present

## 2023-02-25 DIAGNOSIS — E785 Hyperlipidemia, unspecified: Secondary | ICD-10-CM | POA: Diagnosis not present

## 2023-02-25 DIAGNOSIS — T2103XD Burn of unspecified degree of upper back, subsequent encounter: Secondary | ICD-10-CM | POA: Diagnosis not present

## 2023-02-25 DIAGNOSIS — E1165 Type 2 diabetes mellitus with hyperglycemia: Secondary | ICD-10-CM | POA: Diagnosis not present

## 2023-02-25 DIAGNOSIS — D509 Iron deficiency anemia, unspecified: Secondary | ICD-10-CM | POA: Diagnosis not present

## 2023-02-25 DIAGNOSIS — Z9181 History of falling: Secondary | ICD-10-CM | POA: Diagnosis not present

## 2023-02-26 DIAGNOSIS — Z7984 Long term (current) use of oral hypoglycemic drugs: Secondary | ICD-10-CM | POA: Diagnosis not present

## 2023-02-26 DIAGNOSIS — L0390 Cellulitis, unspecified: Secondary | ICD-10-CM | POA: Diagnosis not present

## 2023-02-26 DIAGNOSIS — E1165 Type 2 diabetes mellitus with hyperglycemia: Secondary | ICD-10-CM | POA: Diagnosis not present

## 2023-02-26 DIAGNOSIS — G8929 Other chronic pain: Secondary | ICD-10-CM | POA: Diagnosis not present

## 2023-02-26 DIAGNOSIS — N1831 Chronic kidney disease, stage 3a: Secondary | ICD-10-CM | POA: Diagnosis not present

## 2023-02-26 DIAGNOSIS — X19XXXD Contact with other heat and hot substances, subsequent encounter: Secondary | ICD-10-CM | POA: Diagnosis not present

## 2023-02-26 DIAGNOSIS — W19XXXD Unspecified fall, subsequent encounter: Secondary | ICD-10-CM | POA: Diagnosis not present

## 2023-02-26 DIAGNOSIS — Z9181 History of falling: Secondary | ICD-10-CM | POA: Diagnosis not present

## 2023-02-26 DIAGNOSIS — L89151 Pressure ulcer of sacral region, stage 1: Secondary | ICD-10-CM | POA: Diagnosis not present

## 2023-02-26 DIAGNOSIS — I700 Atherosclerosis of aorta: Secondary | ICD-10-CM | POA: Diagnosis not present

## 2023-02-26 DIAGNOSIS — E1142 Type 2 diabetes mellitus with diabetic polyneuropathy: Secondary | ICD-10-CM | POA: Diagnosis not present

## 2023-02-26 DIAGNOSIS — D509 Iron deficiency anemia, unspecified: Secondary | ICD-10-CM | POA: Diagnosis not present

## 2023-02-26 DIAGNOSIS — Z87891 Personal history of nicotine dependence: Secondary | ICD-10-CM | POA: Diagnosis not present

## 2023-02-26 DIAGNOSIS — L98411 Non-pressure chronic ulcer of buttock limited to breakdown of skin: Secondary | ICD-10-CM | POA: Diagnosis not present

## 2023-02-26 DIAGNOSIS — Z794 Long term (current) use of insulin: Secondary | ICD-10-CM | POA: Diagnosis not present

## 2023-02-26 DIAGNOSIS — E1122 Type 2 diabetes mellitus with diabetic chronic kidney disease: Secondary | ICD-10-CM | POA: Diagnosis not present

## 2023-02-26 DIAGNOSIS — L89892 Pressure ulcer of other site, stage 2: Secondary | ICD-10-CM | POA: Diagnosis not present

## 2023-02-26 DIAGNOSIS — I129 Hypertensive chronic kidney disease with stage 1 through stage 4 chronic kidney disease, or unspecified chronic kidney disease: Secondary | ICD-10-CM | POA: Diagnosis not present

## 2023-02-26 DIAGNOSIS — E785 Hyperlipidemia, unspecified: Secondary | ICD-10-CM | POA: Diagnosis not present

## 2023-02-26 DIAGNOSIS — M8008XD Age-related osteoporosis with current pathological fracture, vertebra(e), subsequent encounter for fracture with routine healing: Secondary | ICD-10-CM | POA: Diagnosis not present

## 2023-02-26 DIAGNOSIS — E039 Hypothyroidism, unspecified: Secondary | ICD-10-CM | POA: Diagnosis not present

## 2023-02-26 DIAGNOSIS — T2103XD Burn of unspecified degree of upper back, subsequent encounter: Secondary | ICD-10-CM | POA: Diagnosis not present

## 2023-03-01 DIAGNOSIS — E1165 Type 2 diabetes mellitus with hyperglycemia: Secondary | ICD-10-CM | POA: Diagnosis not present

## 2023-03-01 DIAGNOSIS — D509 Iron deficiency anemia, unspecified: Secondary | ICD-10-CM | POA: Diagnosis not present

## 2023-03-01 DIAGNOSIS — E1142 Type 2 diabetes mellitus with diabetic polyneuropathy: Secondary | ICD-10-CM | POA: Diagnosis not present

## 2023-03-01 DIAGNOSIS — G8929 Other chronic pain: Secondary | ICD-10-CM | POA: Diagnosis not present

## 2023-03-01 DIAGNOSIS — T2103XD Burn of unspecified degree of upper back, subsequent encounter: Secondary | ICD-10-CM | POA: Diagnosis not present

## 2023-03-01 DIAGNOSIS — Z9181 History of falling: Secondary | ICD-10-CM | POA: Diagnosis not present

## 2023-03-01 DIAGNOSIS — L89892 Pressure ulcer of other site, stage 2: Secondary | ICD-10-CM | POA: Diagnosis not present

## 2023-03-01 DIAGNOSIS — I700 Atherosclerosis of aorta: Secondary | ICD-10-CM | POA: Diagnosis not present

## 2023-03-01 DIAGNOSIS — I129 Hypertensive chronic kidney disease with stage 1 through stage 4 chronic kidney disease, or unspecified chronic kidney disease: Secondary | ICD-10-CM | POA: Diagnosis not present

## 2023-03-01 DIAGNOSIS — X19XXXD Contact with other heat and hot substances, subsequent encounter: Secondary | ICD-10-CM | POA: Diagnosis not present

## 2023-03-01 DIAGNOSIS — Z87891 Personal history of nicotine dependence: Secondary | ICD-10-CM | POA: Diagnosis not present

## 2023-03-01 DIAGNOSIS — W19XXXD Unspecified fall, subsequent encounter: Secondary | ICD-10-CM | POA: Diagnosis not present

## 2023-03-01 DIAGNOSIS — E039 Hypothyroidism, unspecified: Secondary | ICD-10-CM | POA: Diagnosis not present

## 2023-03-01 DIAGNOSIS — L0390 Cellulitis, unspecified: Secondary | ICD-10-CM | POA: Diagnosis not present

## 2023-03-01 DIAGNOSIS — E1122 Type 2 diabetes mellitus with diabetic chronic kidney disease: Secondary | ICD-10-CM | POA: Diagnosis not present

## 2023-03-01 DIAGNOSIS — M8008XD Age-related osteoporosis with current pathological fracture, vertebra(e), subsequent encounter for fracture with routine healing: Secondary | ICD-10-CM | POA: Diagnosis not present

## 2023-03-01 DIAGNOSIS — E785 Hyperlipidemia, unspecified: Secondary | ICD-10-CM | POA: Diagnosis not present

## 2023-03-01 DIAGNOSIS — L89151 Pressure ulcer of sacral region, stage 1: Secondary | ICD-10-CM | POA: Diagnosis not present

## 2023-03-01 DIAGNOSIS — Z7984 Long term (current) use of oral hypoglycemic drugs: Secondary | ICD-10-CM | POA: Diagnosis not present

## 2023-03-01 DIAGNOSIS — L98411 Non-pressure chronic ulcer of buttock limited to breakdown of skin: Secondary | ICD-10-CM | POA: Diagnosis not present

## 2023-03-01 DIAGNOSIS — Z794 Long term (current) use of insulin: Secondary | ICD-10-CM | POA: Diagnosis not present

## 2023-03-01 DIAGNOSIS — N1831 Chronic kidney disease, stage 3a: Secondary | ICD-10-CM | POA: Diagnosis not present

## 2023-03-02 DIAGNOSIS — E785 Hyperlipidemia, unspecified: Secondary | ICD-10-CM | POA: Diagnosis not present

## 2023-03-02 DIAGNOSIS — E1142 Type 2 diabetes mellitus with diabetic polyneuropathy: Secondary | ICD-10-CM | POA: Diagnosis not present

## 2023-03-02 DIAGNOSIS — I129 Hypertensive chronic kidney disease with stage 1 through stage 4 chronic kidney disease, or unspecified chronic kidney disease: Secondary | ICD-10-CM | POA: Diagnosis not present

## 2023-03-02 DIAGNOSIS — E1122 Type 2 diabetes mellitus with diabetic chronic kidney disease: Secondary | ICD-10-CM | POA: Diagnosis not present

## 2023-03-02 DIAGNOSIS — L89151 Pressure ulcer of sacral region, stage 1: Secondary | ICD-10-CM | POA: Diagnosis not present

## 2023-03-02 DIAGNOSIS — X19XXXD Contact with other heat and hot substances, subsequent encounter: Secondary | ICD-10-CM | POA: Diagnosis not present

## 2023-03-02 DIAGNOSIS — Z87891 Personal history of nicotine dependence: Secondary | ICD-10-CM | POA: Diagnosis not present

## 2023-03-02 DIAGNOSIS — E039 Hypothyroidism, unspecified: Secondary | ICD-10-CM | POA: Diagnosis not present

## 2023-03-02 DIAGNOSIS — N1831 Chronic kidney disease, stage 3a: Secondary | ICD-10-CM | POA: Diagnosis not present

## 2023-03-02 DIAGNOSIS — Z7984 Long term (current) use of oral hypoglycemic drugs: Secondary | ICD-10-CM | POA: Diagnosis not present

## 2023-03-02 DIAGNOSIS — D509 Iron deficiency anemia, unspecified: Secondary | ICD-10-CM | POA: Diagnosis not present

## 2023-03-02 DIAGNOSIS — M8008XD Age-related osteoporosis with current pathological fracture, vertebra(e), subsequent encounter for fracture with routine healing: Secondary | ICD-10-CM | POA: Diagnosis not present

## 2023-03-02 DIAGNOSIS — T2103XD Burn of unspecified degree of upper back, subsequent encounter: Secondary | ICD-10-CM | POA: Diagnosis not present

## 2023-03-02 DIAGNOSIS — I700 Atherosclerosis of aorta: Secondary | ICD-10-CM | POA: Diagnosis not present

## 2023-03-02 DIAGNOSIS — W19XXXD Unspecified fall, subsequent encounter: Secondary | ICD-10-CM | POA: Diagnosis not present

## 2023-03-02 DIAGNOSIS — L0390 Cellulitis, unspecified: Secondary | ICD-10-CM | POA: Diagnosis not present

## 2023-03-02 DIAGNOSIS — L98411 Non-pressure chronic ulcer of buttock limited to breakdown of skin: Secondary | ICD-10-CM | POA: Diagnosis not present

## 2023-03-02 DIAGNOSIS — E1165 Type 2 diabetes mellitus with hyperglycemia: Secondary | ICD-10-CM | POA: Diagnosis not present

## 2023-03-02 DIAGNOSIS — Z794 Long term (current) use of insulin: Secondary | ICD-10-CM | POA: Diagnosis not present

## 2023-03-02 DIAGNOSIS — Z9181 History of falling: Secondary | ICD-10-CM | POA: Diagnosis not present

## 2023-03-02 DIAGNOSIS — L89892 Pressure ulcer of other site, stage 2: Secondary | ICD-10-CM | POA: Diagnosis not present

## 2023-03-02 DIAGNOSIS — G8929 Other chronic pain: Secondary | ICD-10-CM | POA: Diagnosis not present

## 2023-03-08 DIAGNOSIS — Z87891 Personal history of nicotine dependence: Secondary | ICD-10-CM | POA: Diagnosis not present

## 2023-03-08 DIAGNOSIS — L98411 Non-pressure chronic ulcer of buttock limited to breakdown of skin: Secondary | ICD-10-CM | POA: Diagnosis not present

## 2023-03-08 DIAGNOSIS — W19XXXD Unspecified fall, subsequent encounter: Secondary | ICD-10-CM | POA: Diagnosis not present

## 2023-03-08 DIAGNOSIS — I129 Hypertensive chronic kidney disease with stage 1 through stage 4 chronic kidney disease, or unspecified chronic kidney disease: Secondary | ICD-10-CM | POA: Diagnosis not present

## 2023-03-08 DIAGNOSIS — N1831 Chronic kidney disease, stage 3a: Secondary | ICD-10-CM | POA: Diagnosis not present

## 2023-03-08 DIAGNOSIS — L89151 Pressure ulcer of sacral region, stage 1: Secondary | ICD-10-CM | POA: Diagnosis not present

## 2023-03-08 DIAGNOSIS — Z9181 History of falling: Secondary | ICD-10-CM | POA: Diagnosis not present

## 2023-03-08 DIAGNOSIS — M8008XD Age-related osteoporosis with current pathological fracture, vertebra(e), subsequent encounter for fracture with routine healing: Secondary | ICD-10-CM | POA: Diagnosis not present

## 2023-03-08 DIAGNOSIS — Z7984 Long term (current) use of oral hypoglycemic drugs: Secondary | ICD-10-CM | POA: Diagnosis not present

## 2023-03-08 DIAGNOSIS — D509 Iron deficiency anemia, unspecified: Secondary | ICD-10-CM | POA: Diagnosis not present

## 2023-03-08 DIAGNOSIS — E1122 Type 2 diabetes mellitus with diabetic chronic kidney disease: Secondary | ICD-10-CM | POA: Diagnosis not present

## 2023-03-08 DIAGNOSIS — E1142 Type 2 diabetes mellitus with diabetic polyneuropathy: Secondary | ICD-10-CM | POA: Diagnosis not present

## 2023-03-08 DIAGNOSIS — T2103XD Burn of unspecified degree of upper back, subsequent encounter: Secondary | ICD-10-CM | POA: Diagnosis not present

## 2023-03-08 DIAGNOSIS — Z794 Long term (current) use of insulin: Secondary | ICD-10-CM | POA: Diagnosis not present

## 2023-03-08 DIAGNOSIS — E039 Hypothyroidism, unspecified: Secondary | ICD-10-CM | POA: Diagnosis not present

## 2023-03-08 DIAGNOSIS — I700 Atherosclerosis of aorta: Secondary | ICD-10-CM | POA: Diagnosis not present

## 2023-03-08 DIAGNOSIS — G8929 Other chronic pain: Secondary | ICD-10-CM | POA: Diagnosis not present

## 2023-03-08 DIAGNOSIS — E1165 Type 2 diabetes mellitus with hyperglycemia: Secondary | ICD-10-CM | POA: Diagnosis not present

## 2023-03-08 DIAGNOSIS — E785 Hyperlipidemia, unspecified: Secondary | ICD-10-CM | POA: Diagnosis not present

## 2023-03-08 DIAGNOSIS — L0390 Cellulitis, unspecified: Secondary | ICD-10-CM | POA: Diagnosis not present

## 2023-03-08 DIAGNOSIS — X19XXXD Contact with other heat and hot substances, subsequent encounter: Secondary | ICD-10-CM | POA: Diagnosis not present

## 2023-03-08 DIAGNOSIS — L89892 Pressure ulcer of other site, stage 2: Secondary | ICD-10-CM | POA: Diagnosis not present

## 2023-03-09 DIAGNOSIS — Z7984 Long term (current) use of oral hypoglycemic drugs: Secondary | ICD-10-CM | POA: Diagnosis not present

## 2023-03-09 DIAGNOSIS — I129 Hypertensive chronic kidney disease with stage 1 through stage 4 chronic kidney disease, or unspecified chronic kidney disease: Secondary | ICD-10-CM | POA: Diagnosis not present

## 2023-03-09 DIAGNOSIS — I700 Atherosclerosis of aorta: Secondary | ICD-10-CM | POA: Diagnosis not present

## 2023-03-09 DIAGNOSIS — L0390 Cellulitis, unspecified: Secondary | ICD-10-CM | POA: Diagnosis not present

## 2023-03-09 DIAGNOSIS — N1831 Chronic kidney disease, stage 3a: Secondary | ICD-10-CM | POA: Diagnosis not present

## 2023-03-09 DIAGNOSIS — E1142 Type 2 diabetes mellitus with diabetic polyneuropathy: Secondary | ICD-10-CM | POA: Diagnosis not present

## 2023-03-09 DIAGNOSIS — Z794 Long term (current) use of insulin: Secondary | ICD-10-CM | POA: Diagnosis not present

## 2023-03-09 DIAGNOSIS — L89892 Pressure ulcer of other site, stage 2: Secondary | ICD-10-CM | POA: Diagnosis not present

## 2023-03-09 DIAGNOSIS — M8008XD Age-related osteoporosis with current pathological fracture, vertebra(e), subsequent encounter for fracture with routine healing: Secondary | ICD-10-CM | POA: Diagnosis not present

## 2023-03-09 DIAGNOSIS — L98411 Non-pressure chronic ulcer of buttock limited to breakdown of skin: Secondary | ICD-10-CM | POA: Diagnosis not present

## 2023-03-09 DIAGNOSIS — T2103XD Burn of unspecified degree of upper back, subsequent encounter: Secondary | ICD-10-CM | POA: Diagnosis not present

## 2023-03-09 DIAGNOSIS — D509 Iron deficiency anemia, unspecified: Secondary | ICD-10-CM | POA: Diagnosis not present

## 2023-03-09 DIAGNOSIS — L89151 Pressure ulcer of sacral region, stage 1: Secondary | ICD-10-CM | POA: Diagnosis not present

## 2023-03-09 DIAGNOSIS — Z87891 Personal history of nicotine dependence: Secondary | ICD-10-CM | POA: Diagnosis not present

## 2023-03-09 DIAGNOSIS — Z9181 History of falling: Secondary | ICD-10-CM | POA: Diagnosis not present

## 2023-03-09 DIAGNOSIS — E1165 Type 2 diabetes mellitus with hyperglycemia: Secondary | ICD-10-CM | POA: Diagnosis not present

## 2023-03-09 DIAGNOSIS — E039 Hypothyroidism, unspecified: Secondary | ICD-10-CM | POA: Diagnosis not present

## 2023-03-09 DIAGNOSIS — E785 Hyperlipidemia, unspecified: Secondary | ICD-10-CM | POA: Diagnosis not present

## 2023-03-09 DIAGNOSIS — G8929 Other chronic pain: Secondary | ICD-10-CM | POA: Diagnosis not present

## 2023-03-09 DIAGNOSIS — W19XXXD Unspecified fall, subsequent encounter: Secondary | ICD-10-CM | POA: Diagnosis not present

## 2023-03-09 DIAGNOSIS — E1122 Type 2 diabetes mellitus with diabetic chronic kidney disease: Secondary | ICD-10-CM | POA: Diagnosis not present

## 2023-03-09 DIAGNOSIS — X19XXXD Contact with other heat and hot substances, subsequent encounter: Secondary | ICD-10-CM | POA: Diagnosis not present

## 2023-03-10 DIAGNOSIS — E1122 Type 2 diabetes mellitus with diabetic chronic kidney disease: Secondary | ICD-10-CM | POA: Diagnosis not present

## 2023-03-10 DIAGNOSIS — L98411 Non-pressure chronic ulcer of buttock limited to breakdown of skin: Secondary | ICD-10-CM | POA: Diagnosis not present

## 2023-03-10 DIAGNOSIS — E785 Hyperlipidemia, unspecified: Secondary | ICD-10-CM | POA: Diagnosis not present

## 2023-03-10 DIAGNOSIS — E1142 Type 2 diabetes mellitus with diabetic polyneuropathy: Secondary | ICD-10-CM | POA: Diagnosis not present

## 2023-03-10 DIAGNOSIS — I700 Atherosclerosis of aorta: Secondary | ICD-10-CM | POA: Diagnosis not present

## 2023-03-10 DIAGNOSIS — Z7984 Long term (current) use of oral hypoglycemic drugs: Secondary | ICD-10-CM | POA: Diagnosis not present

## 2023-03-10 DIAGNOSIS — T2103XD Burn of unspecified degree of upper back, subsequent encounter: Secondary | ICD-10-CM | POA: Diagnosis not present

## 2023-03-10 DIAGNOSIS — L89892 Pressure ulcer of other site, stage 2: Secondary | ICD-10-CM | POA: Diagnosis not present

## 2023-03-10 DIAGNOSIS — L0390 Cellulitis, unspecified: Secondary | ICD-10-CM | POA: Diagnosis not present

## 2023-03-10 DIAGNOSIS — Z794 Long term (current) use of insulin: Secondary | ICD-10-CM | POA: Diagnosis not present

## 2023-03-10 DIAGNOSIS — X19XXXD Contact with other heat and hot substances, subsequent encounter: Secondary | ICD-10-CM | POA: Diagnosis not present

## 2023-03-10 DIAGNOSIS — L89151 Pressure ulcer of sacral region, stage 1: Secondary | ICD-10-CM | POA: Diagnosis not present

## 2023-03-10 DIAGNOSIS — G8929 Other chronic pain: Secondary | ICD-10-CM | POA: Diagnosis not present

## 2023-03-10 DIAGNOSIS — Z87891 Personal history of nicotine dependence: Secondary | ICD-10-CM | POA: Diagnosis not present

## 2023-03-10 DIAGNOSIS — Z9181 History of falling: Secondary | ICD-10-CM | POA: Diagnosis not present

## 2023-03-10 DIAGNOSIS — N1831 Chronic kidney disease, stage 3a: Secondary | ICD-10-CM | POA: Diagnosis not present

## 2023-03-10 DIAGNOSIS — W19XXXD Unspecified fall, subsequent encounter: Secondary | ICD-10-CM | POA: Diagnosis not present

## 2023-03-10 DIAGNOSIS — D509 Iron deficiency anemia, unspecified: Secondary | ICD-10-CM | POA: Diagnosis not present

## 2023-03-10 DIAGNOSIS — M8008XD Age-related osteoporosis with current pathological fracture, vertebra(e), subsequent encounter for fracture with routine healing: Secondary | ICD-10-CM | POA: Diagnosis not present

## 2023-03-10 DIAGNOSIS — E039 Hypothyroidism, unspecified: Secondary | ICD-10-CM | POA: Diagnosis not present

## 2023-03-10 DIAGNOSIS — I129 Hypertensive chronic kidney disease with stage 1 through stage 4 chronic kidney disease, or unspecified chronic kidney disease: Secondary | ICD-10-CM | POA: Diagnosis not present

## 2023-03-10 DIAGNOSIS — E1165 Type 2 diabetes mellitus with hyperglycemia: Secondary | ICD-10-CM | POA: Diagnosis not present

## 2023-03-15 DIAGNOSIS — Z794 Long term (current) use of insulin: Secondary | ICD-10-CM | POA: Diagnosis not present

## 2023-03-15 DIAGNOSIS — E1165 Type 2 diabetes mellitus with hyperglycemia: Secondary | ICD-10-CM | POA: Diagnosis not present

## 2023-03-15 DIAGNOSIS — E1122 Type 2 diabetes mellitus with diabetic chronic kidney disease: Secondary | ICD-10-CM | POA: Diagnosis not present

## 2023-03-15 DIAGNOSIS — L0390 Cellulitis, unspecified: Secondary | ICD-10-CM | POA: Diagnosis not present

## 2023-03-15 DIAGNOSIS — M8008XD Age-related osteoporosis with current pathological fracture, vertebra(e), subsequent encounter for fracture with routine healing: Secondary | ICD-10-CM | POA: Diagnosis not present

## 2023-03-15 DIAGNOSIS — E039 Hypothyroidism, unspecified: Secondary | ICD-10-CM | POA: Diagnosis not present

## 2023-03-15 DIAGNOSIS — W19XXXD Unspecified fall, subsequent encounter: Secondary | ICD-10-CM | POA: Diagnosis not present

## 2023-03-15 DIAGNOSIS — L89892 Pressure ulcer of other site, stage 2: Secondary | ICD-10-CM | POA: Diagnosis not present

## 2023-03-15 DIAGNOSIS — N1831 Chronic kidney disease, stage 3a: Secondary | ICD-10-CM | POA: Diagnosis not present

## 2023-03-15 DIAGNOSIS — Z9181 History of falling: Secondary | ICD-10-CM | POA: Diagnosis not present

## 2023-03-15 DIAGNOSIS — I129 Hypertensive chronic kidney disease with stage 1 through stage 4 chronic kidney disease, or unspecified chronic kidney disease: Secondary | ICD-10-CM | POA: Diagnosis not present

## 2023-03-15 DIAGNOSIS — I700 Atherosclerosis of aorta: Secondary | ICD-10-CM | POA: Diagnosis not present

## 2023-03-15 DIAGNOSIS — E785 Hyperlipidemia, unspecified: Secondary | ICD-10-CM | POA: Diagnosis not present

## 2023-03-15 DIAGNOSIS — D509 Iron deficiency anemia, unspecified: Secondary | ICD-10-CM | POA: Diagnosis not present

## 2023-03-15 DIAGNOSIS — E1142 Type 2 diabetes mellitus with diabetic polyneuropathy: Secondary | ICD-10-CM | POA: Diagnosis not present

## 2023-03-15 DIAGNOSIS — Z87891 Personal history of nicotine dependence: Secondary | ICD-10-CM | POA: Diagnosis not present

## 2023-03-15 DIAGNOSIS — L89151 Pressure ulcer of sacral region, stage 1: Secondary | ICD-10-CM | POA: Diagnosis not present

## 2023-03-15 DIAGNOSIS — Z7984 Long term (current) use of oral hypoglycemic drugs: Secondary | ICD-10-CM | POA: Diagnosis not present

## 2023-03-15 DIAGNOSIS — L98411 Non-pressure chronic ulcer of buttock limited to breakdown of skin: Secondary | ICD-10-CM | POA: Diagnosis not present

## 2023-03-15 DIAGNOSIS — T2103XD Burn of unspecified degree of upper back, subsequent encounter: Secondary | ICD-10-CM | POA: Diagnosis not present

## 2023-03-15 DIAGNOSIS — X19XXXD Contact with other heat and hot substances, subsequent encounter: Secondary | ICD-10-CM | POA: Diagnosis not present

## 2023-03-15 DIAGNOSIS — G8929 Other chronic pain: Secondary | ICD-10-CM | POA: Diagnosis not present

## 2023-03-18 DIAGNOSIS — E1142 Type 2 diabetes mellitus with diabetic polyneuropathy: Secondary | ICD-10-CM | POA: Diagnosis not present

## 2023-03-18 DIAGNOSIS — N1831 Chronic kidney disease, stage 3a: Secondary | ICD-10-CM | POA: Diagnosis not present

## 2023-03-18 DIAGNOSIS — M8008XD Age-related osteoporosis with current pathological fracture, vertebra(e), subsequent encounter for fracture with routine healing: Secondary | ICD-10-CM | POA: Diagnosis not present

## 2023-03-18 DIAGNOSIS — D509 Iron deficiency anemia, unspecified: Secondary | ICD-10-CM | POA: Diagnosis not present

## 2023-03-18 DIAGNOSIS — I129 Hypertensive chronic kidney disease with stage 1 through stage 4 chronic kidney disease, or unspecified chronic kidney disease: Secondary | ICD-10-CM | POA: Diagnosis not present

## 2023-03-18 DIAGNOSIS — Z87891 Personal history of nicotine dependence: Secondary | ICD-10-CM | POA: Diagnosis not present

## 2023-03-18 DIAGNOSIS — Z794 Long term (current) use of insulin: Secondary | ICD-10-CM | POA: Diagnosis not present

## 2023-03-18 DIAGNOSIS — L98411 Non-pressure chronic ulcer of buttock limited to breakdown of skin: Secondary | ICD-10-CM | POA: Diagnosis not present

## 2023-03-18 DIAGNOSIS — Z9181 History of falling: Secondary | ICD-10-CM | POA: Diagnosis not present

## 2023-03-18 DIAGNOSIS — L0390 Cellulitis, unspecified: Secondary | ICD-10-CM | POA: Diagnosis not present

## 2023-03-18 DIAGNOSIS — I700 Atherosclerosis of aorta: Secondary | ICD-10-CM | POA: Diagnosis not present

## 2023-03-18 DIAGNOSIS — E1122 Type 2 diabetes mellitus with diabetic chronic kidney disease: Secondary | ICD-10-CM | POA: Diagnosis not present

## 2023-03-18 DIAGNOSIS — E1165 Type 2 diabetes mellitus with hyperglycemia: Secondary | ICD-10-CM | POA: Diagnosis not present

## 2023-03-18 DIAGNOSIS — E785 Hyperlipidemia, unspecified: Secondary | ICD-10-CM | POA: Diagnosis not present

## 2023-03-18 DIAGNOSIS — E039 Hypothyroidism, unspecified: Secondary | ICD-10-CM | POA: Diagnosis not present

## 2023-03-18 DIAGNOSIS — W19XXXD Unspecified fall, subsequent encounter: Secondary | ICD-10-CM | POA: Diagnosis not present

## 2023-03-18 DIAGNOSIS — T2103XD Burn of unspecified degree of upper back, subsequent encounter: Secondary | ICD-10-CM | POA: Diagnosis not present

## 2023-03-18 DIAGNOSIS — L89892 Pressure ulcer of other site, stage 2: Secondary | ICD-10-CM | POA: Diagnosis not present

## 2023-03-18 DIAGNOSIS — L89151 Pressure ulcer of sacral region, stage 1: Secondary | ICD-10-CM | POA: Diagnosis not present

## 2023-03-18 DIAGNOSIS — Z7984 Long term (current) use of oral hypoglycemic drugs: Secondary | ICD-10-CM | POA: Diagnosis not present

## 2023-03-18 DIAGNOSIS — G8929 Other chronic pain: Secondary | ICD-10-CM | POA: Diagnosis not present

## 2023-03-18 DIAGNOSIS — X19XXXD Contact with other heat and hot substances, subsequent encounter: Secondary | ICD-10-CM | POA: Diagnosis not present

## 2023-03-23 DIAGNOSIS — E1142 Type 2 diabetes mellitus with diabetic polyneuropathy: Secondary | ICD-10-CM | POA: Diagnosis not present

## 2023-03-23 DIAGNOSIS — L89892 Pressure ulcer of other site, stage 2: Secondary | ICD-10-CM | POA: Diagnosis not present

## 2023-03-23 DIAGNOSIS — Z87891 Personal history of nicotine dependence: Secondary | ICD-10-CM | POA: Diagnosis not present

## 2023-03-23 DIAGNOSIS — I129 Hypertensive chronic kidney disease with stage 1 through stage 4 chronic kidney disease, or unspecified chronic kidney disease: Secondary | ICD-10-CM | POA: Diagnosis not present

## 2023-03-23 DIAGNOSIS — G8929 Other chronic pain: Secondary | ICD-10-CM | POA: Diagnosis not present

## 2023-03-23 DIAGNOSIS — N1831 Chronic kidney disease, stage 3a: Secondary | ICD-10-CM | POA: Diagnosis not present

## 2023-03-23 DIAGNOSIS — D509 Iron deficiency anemia, unspecified: Secondary | ICD-10-CM | POA: Diagnosis not present

## 2023-03-23 DIAGNOSIS — M8008XD Age-related osteoporosis with current pathological fracture, vertebra(e), subsequent encounter for fracture with routine healing: Secondary | ICD-10-CM | POA: Diagnosis not present

## 2023-03-23 DIAGNOSIS — W19XXXD Unspecified fall, subsequent encounter: Secondary | ICD-10-CM | POA: Diagnosis not present

## 2023-03-23 DIAGNOSIS — Z794 Long term (current) use of insulin: Secondary | ICD-10-CM | POA: Diagnosis not present

## 2023-03-23 DIAGNOSIS — E785 Hyperlipidemia, unspecified: Secondary | ICD-10-CM | POA: Diagnosis not present

## 2023-03-23 DIAGNOSIS — Z9181 History of falling: Secondary | ICD-10-CM | POA: Diagnosis not present

## 2023-03-23 DIAGNOSIS — E039 Hypothyroidism, unspecified: Secondary | ICD-10-CM | POA: Diagnosis not present

## 2023-03-23 DIAGNOSIS — E1122 Type 2 diabetes mellitus with diabetic chronic kidney disease: Secondary | ICD-10-CM | POA: Diagnosis not present

## 2023-03-23 DIAGNOSIS — Z7984 Long term (current) use of oral hypoglycemic drugs: Secondary | ICD-10-CM | POA: Diagnosis not present

## 2023-03-23 DIAGNOSIS — I700 Atherosclerosis of aorta: Secondary | ICD-10-CM | POA: Diagnosis not present

## 2023-03-29 DIAGNOSIS — I700 Atherosclerosis of aorta: Secondary | ICD-10-CM | POA: Diagnosis not present

## 2023-03-29 DIAGNOSIS — I129 Hypertensive chronic kidney disease with stage 1 through stage 4 chronic kidney disease, or unspecified chronic kidney disease: Secondary | ICD-10-CM | POA: Diagnosis not present

## 2023-03-29 DIAGNOSIS — G8929 Other chronic pain: Secondary | ICD-10-CM | POA: Diagnosis not present

## 2023-03-29 DIAGNOSIS — D509 Iron deficiency anemia, unspecified: Secondary | ICD-10-CM | POA: Diagnosis not present

## 2023-03-29 DIAGNOSIS — E1142 Type 2 diabetes mellitus with diabetic polyneuropathy: Secondary | ICD-10-CM | POA: Diagnosis not present

## 2023-03-29 DIAGNOSIS — L89892 Pressure ulcer of other site, stage 2: Secondary | ICD-10-CM | POA: Diagnosis not present

## 2023-03-29 DIAGNOSIS — Z7984 Long term (current) use of oral hypoglycemic drugs: Secondary | ICD-10-CM | POA: Diagnosis not present

## 2023-03-29 DIAGNOSIS — Z87891 Personal history of nicotine dependence: Secondary | ICD-10-CM | POA: Diagnosis not present

## 2023-03-29 DIAGNOSIS — E1122 Type 2 diabetes mellitus with diabetic chronic kidney disease: Secondary | ICD-10-CM | POA: Diagnosis not present

## 2023-03-29 DIAGNOSIS — E785 Hyperlipidemia, unspecified: Secondary | ICD-10-CM | POA: Diagnosis not present

## 2023-03-29 DIAGNOSIS — E039 Hypothyroidism, unspecified: Secondary | ICD-10-CM | POA: Diagnosis not present

## 2023-03-29 DIAGNOSIS — Z9181 History of falling: Secondary | ICD-10-CM | POA: Diagnosis not present

## 2023-03-29 DIAGNOSIS — W19XXXD Unspecified fall, subsequent encounter: Secondary | ICD-10-CM | POA: Diagnosis not present

## 2023-03-29 DIAGNOSIS — Z794 Long term (current) use of insulin: Secondary | ICD-10-CM | POA: Diagnosis not present

## 2023-03-29 DIAGNOSIS — N1831 Chronic kidney disease, stage 3a: Secondary | ICD-10-CM | POA: Diagnosis not present

## 2023-03-29 DIAGNOSIS — M8008XD Age-related osteoporosis with current pathological fracture, vertebra(e), subsequent encounter for fracture with routine healing: Secondary | ICD-10-CM | POA: Diagnosis not present

## 2023-04-06 DIAGNOSIS — S32010A Wedge compression fracture of first lumbar vertebra, initial encounter for closed fracture: Secondary | ICD-10-CM | POA: Diagnosis not present

## 2023-04-08 DIAGNOSIS — M8008XD Age-related osteoporosis with current pathological fracture, vertebra(e), subsequent encounter for fracture with routine healing: Secondary | ICD-10-CM | POA: Diagnosis not present

## 2023-04-08 DIAGNOSIS — I700 Atherosclerosis of aorta: Secondary | ICD-10-CM | POA: Diagnosis not present

## 2023-04-08 DIAGNOSIS — E039 Hypothyroidism, unspecified: Secondary | ICD-10-CM | POA: Diagnosis not present

## 2023-04-08 DIAGNOSIS — Z794 Long term (current) use of insulin: Secondary | ICD-10-CM | POA: Diagnosis not present

## 2023-04-08 DIAGNOSIS — N1831 Chronic kidney disease, stage 3a: Secondary | ICD-10-CM | POA: Diagnosis not present

## 2023-04-08 DIAGNOSIS — E1122 Type 2 diabetes mellitus with diabetic chronic kidney disease: Secondary | ICD-10-CM | POA: Diagnosis not present

## 2023-04-08 DIAGNOSIS — Z9181 History of falling: Secondary | ICD-10-CM | POA: Diagnosis not present

## 2023-04-08 DIAGNOSIS — L89892 Pressure ulcer of other site, stage 2: Secondary | ICD-10-CM | POA: Diagnosis not present

## 2023-04-08 DIAGNOSIS — E1142 Type 2 diabetes mellitus with diabetic polyneuropathy: Secondary | ICD-10-CM | POA: Diagnosis not present

## 2023-04-08 DIAGNOSIS — I129 Hypertensive chronic kidney disease with stage 1 through stage 4 chronic kidney disease, or unspecified chronic kidney disease: Secondary | ICD-10-CM | POA: Diagnosis not present

## 2023-04-08 DIAGNOSIS — W19XXXD Unspecified fall, subsequent encounter: Secondary | ICD-10-CM | POA: Diagnosis not present

## 2023-04-08 DIAGNOSIS — G8929 Other chronic pain: Secondary | ICD-10-CM | POA: Diagnosis not present

## 2023-04-08 DIAGNOSIS — Z87891 Personal history of nicotine dependence: Secondary | ICD-10-CM | POA: Diagnosis not present

## 2023-04-08 DIAGNOSIS — D509 Iron deficiency anemia, unspecified: Secondary | ICD-10-CM | POA: Diagnosis not present

## 2023-04-08 DIAGNOSIS — Z7984 Long term (current) use of oral hypoglycemic drugs: Secondary | ICD-10-CM | POA: Diagnosis not present

## 2023-04-08 DIAGNOSIS — E785 Hyperlipidemia, unspecified: Secondary | ICD-10-CM | POA: Diagnosis not present

## 2023-04-15 DIAGNOSIS — G8929 Other chronic pain: Secondary | ICD-10-CM | POA: Diagnosis not present

## 2023-04-15 DIAGNOSIS — E039 Hypothyroidism, unspecified: Secondary | ICD-10-CM | POA: Diagnosis not present

## 2023-04-15 DIAGNOSIS — I129 Hypertensive chronic kidney disease with stage 1 through stage 4 chronic kidney disease, or unspecified chronic kidney disease: Secondary | ICD-10-CM | POA: Diagnosis not present

## 2023-04-15 DIAGNOSIS — E785 Hyperlipidemia, unspecified: Secondary | ICD-10-CM | POA: Diagnosis not present

## 2023-04-15 DIAGNOSIS — I700 Atherosclerosis of aorta: Secondary | ICD-10-CM | POA: Diagnosis not present

## 2023-04-15 DIAGNOSIS — Z87891 Personal history of nicotine dependence: Secondary | ICD-10-CM | POA: Diagnosis not present

## 2023-04-15 DIAGNOSIS — E1142 Type 2 diabetes mellitus with diabetic polyneuropathy: Secondary | ICD-10-CM | POA: Diagnosis not present

## 2023-04-15 DIAGNOSIS — W19XXXD Unspecified fall, subsequent encounter: Secondary | ICD-10-CM | POA: Diagnosis not present

## 2023-04-15 DIAGNOSIS — Z7984 Long term (current) use of oral hypoglycemic drugs: Secondary | ICD-10-CM | POA: Diagnosis not present

## 2023-04-15 DIAGNOSIS — M8008XD Age-related osteoporosis with current pathological fracture, vertebra(e), subsequent encounter for fracture with routine healing: Secondary | ICD-10-CM | POA: Diagnosis not present

## 2023-04-15 DIAGNOSIS — Z794 Long term (current) use of insulin: Secondary | ICD-10-CM | POA: Diagnosis not present

## 2023-04-15 DIAGNOSIS — D509 Iron deficiency anemia, unspecified: Secondary | ICD-10-CM | POA: Diagnosis not present

## 2023-04-15 DIAGNOSIS — Z9181 History of falling: Secondary | ICD-10-CM | POA: Diagnosis not present

## 2023-04-15 DIAGNOSIS — E1122 Type 2 diabetes mellitus with diabetic chronic kidney disease: Secondary | ICD-10-CM | POA: Diagnosis not present

## 2023-04-15 DIAGNOSIS — L89892 Pressure ulcer of other site, stage 2: Secondary | ICD-10-CM | POA: Diagnosis not present

## 2023-04-15 DIAGNOSIS — N1831 Chronic kidney disease, stage 3a: Secondary | ICD-10-CM | POA: Diagnosis not present

## 2023-04-16 ENCOUNTER — Other Ambulatory Visit: Payer: Self-pay

## 2023-04-16 DIAGNOSIS — Z8781 Personal history of (healed) traumatic fracture: Secondary | ICD-10-CM | POA: Diagnosis not present

## 2023-04-16 DIAGNOSIS — M810 Age-related osteoporosis without current pathological fracture: Secondary | ICD-10-CM | POA: Diagnosis not present

## 2023-04-16 DIAGNOSIS — E279 Disorder of adrenal gland, unspecified: Secondary | ICD-10-CM | POA: Diagnosis not present

## 2023-04-16 DIAGNOSIS — E1142 Type 2 diabetes mellitus with diabetic polyneuropathy: Secondary | ICD-10-CM | POA: Diagnosis not present

## 2023-04-16 DIAGNOSIS — E039 Hypothyroidism, unspecified: Secondary | ICD-10-CM | POA: Diagnosis not present

## 2023-04-16 DIAGNOSIS — Z23 Encounter for immunization: Secondary | ICD-10-CM | POA: Diagnosis not present

## 2023-04-16 MED ORDER — COVID-19 MRNA VAC-TRIS(PFIZER) 30 MCG/0.3ML IM SUSY
0.3000 mL | PREFILLED_SYRINGE | Freq: Once | INTRAMUSCULAR | 0 refills | Status: AC
  Filled 2023-04-16: qty 0.3, 1d supply, fill #0

## 2023-05-12 DIAGNOSIS — L570 Actinic keratosis: Secondary | ICD-10-CM | POA: Diagnosis not present

## 2023-05-12 DIAGNOSIS — L814 Other melanin hyperpigmentation: Secondary | ICD-10-CM | POA: Diagnosis not present

## 2023-05-12 DIAGNOSIS — Z85828 Personal history of other malignant neoplasm of skin: Secondary | ICD-10-CM | POA: Diagnosis not present

## 2023-05-12 DIAGNOSIS — L821 Other seborrheic keratosis: Secondary | ICD-10-CM | POA: Diagnosis not present

## 2023-05-12 DIAGNOSIS — D1801 Hemangioma of skin and subcutaneous tissue: Secondary | ICD-10-CM | POA: Diagnosis not present

## 2023-05-12 DIAGNOSIS — L918 Other hypertrophic disorders of the skin: Secondary | ICD-10-CM | POA: Diagnosis not present

## 2023-05-12 DIAGNOSIS — D220 Melanocytic nevi of lip: Secondary | ICD-10-CM | POA: Diagnosis not present

## 2023-05-24 DIAGNOSIS — M5416 Radiculopathy, lumbar region: Secondary | ICD-10-CM | POA: Diagnosis not present

## 2023-05-24 DIAGNOSIS — Z79899 Other long term (current) drug therapy: Secondary | ICD-10-CM | POA: Diagnosis not present

## 2023-05-24 DIAGNOSIS — Z5181 Encounter for therapeutic drug level monitoring: Secondary | ICD-10-CM | POA: Diagnosis not present

## 2023-06-14 DIAGNOSIS — I129 Hypertensive chronic kidney disease with stage 1 through stage 4 chronic kidney disease, or unspecified chronic kidney disease: Secondary | ICD-10-CM | POA: Diagnosis not present

## 2023-06-14 DIAGNOSIS — E1122 Type 2 diabetes mellitus with diabetic chronic kidney disease: Secondary | ICD-10-CM | POA: Diagnosis not present

## 2023-06-14 DIAGNOSIS — R29898 Other symptoms and signs involving the musculoskeletal system: Secondary | ICD-10-CM | POA: Diagnosis not present

## 2023-06-14 DIAGNOSIS — D473 Essential (hemorrhagic) thrombocythemia: Secondary | ICD-10-CM | POA: Diagnosis not present

## 2023-06-14 DIAGNOSIS — M810 Age-related osteoporosis without current pathological fracture: Secondary | ICD-10-CM | POA: Diagnosis not present

## 2023-06-14 DIAGNOSIS — Z0000 Encounter for general adult medical examination without abnormal findings: Secondary | ICD-10-CM | POA: Diagnosis not present

## 2023-06-14 DIAGNOSIS — E1142 Type 2 diabetes mellitus with diabetic polyneuropathy: Secondary | ICD-10-CM | POA: Diagnosis not present

## 2023-06-14 DIAGNOSIS — E039 Hypothyroidism, unspecified: Secondary | ICD-10-CM | POA: Diagnosis not present

## 2023-06-14 DIAGNOSIS — R600 Localized edema: Secondary | ICD-10-CM | POA: Diagnosis not present

## 2023-06-14 DIAGNOSIS — N1831 Chronic kidney disease, stage 3a: Secondary | ICD-10-CM | POA: Diagnosis not present

## 2023-06-14 DIAGNOSIS — Z79899 Other long term (current) drug therapy: Secondary | ICD-10-CM | POA: Diagnosis not present

## 2023-06-22 DIAGNOSIS — M5416 Radiculopathy, lumbar region: Secondary | ICD-10-CM | POA: Diagnosis not present

## 2023-07-07 DIAGNOSIS — G894 Chronic pain syndrome: Secondary | ICD-10-CM | POA: Diagnosis not present

## 2023-07-07 DIAGNOSIS — Z79899 Other long term (current) drug therapy: Secondary | ICD-10-CM | POA: Diagnosis not present

## 2023-07-07 DIAGNOSIS — M5416 Radiculopathy, lumbar region: Secondary | ICD-10-CM | POA: Diagnosis not present

## 2023-07-19 DIAGNOSIS — Z8781 Personal history of (healed) traumatic fracture: Secondary | ICD-10-CM | POA: Diagnosis not present

## 2023-07-19 DIAGNOSIS — E279 Disorder of adrenal gland, unspecified: Secondary | ICD-10-CM | POA: Diagnosis not present

## 2023-07-19 DIAGNOSIS — M810 Age-related osteoporosis without current pathological fracture: Secondary | ICD-10-CM | POA: Diagnosis not present

## 2023-07-19 DIAGNOSIS — E1142 Type 2 diabetes mellitus with diabetic polyneuropathy: Secondary | ICD-10-CM | POA: Diagnosis not present

## 2023-07-19 DIAGNOSIS — E039 Hypothyroidism, unspecified: Secondary | ICD-10-CM | POA: Diagnosis not present

## 2023-07-27 DIAGNOSIS — N289 Disorder of kidney and ureter, unspecified: Secondary | ICD-10-CM | POA: Diagnosis not present

## 2023-09-24 DIAGNOSIS — M5416 Radiculopathy, lumbar region: Secondary | ICD-10-CM | POA: Diagnosis not present

## 2023-09-24 DIAGNOSIS — M5116 Intervertebral disc disorders with radiculopathy, lumbar region: Secondary | ICD-10-CM | POA: Diagnosis not present

## 2023-10-14 DIAGNOSIS — E1142 Type 2 diabetes mellitus with diabetic polyneuropathy: Secondary | ICD-10-CM | POA: Diagnosis not present

## 2023-10-14 DIAGNOSIS — R29898 Other symptoms and signs involving the musculoskeletal system: Secondary | ICD-10-CM | POA: Diagnosis not present

## 2023-10-14 DIAGNOSIS — N1831 Chronic kidney disease, stage 3a: Secondary | ICD-10-CM | POA: Diagnosis not present

## 2023-10-14 DIAGNOSIS — R600 Localized edema: Secondary | ICD-10-CM | POA: Diagnosis not present

## 2023-10-14 DIAGNOSIS — I129 Hypertensive chronic kidney disease with stage 1 through stage 4 chronic kidney disease, or unspecified chronic kidney disease: Secondary | ICD-10-CM | POA: Diagnosis not present

## 2023-10-14 DIAGNOSIS — Z23 Encounter for immunization: Secondary | ICD-10-CM | POA: Diagnosis not present

## 2023-10-14 DIAGNOSIS — Z79899 Other long term (current) drug therapy: Secondary | ICD-10-CM | POA: Diagnosis not present

## 2023-10-14 DIAGNOSIS — R6889 Other general symptoms and signs: Secondary | ICD-10-CM | POA: Diagnosis not present

## 2023-11-02 DIAGNOSIS — E875 Hyperkalemia: Secondary | ICD-10-CM | POA: Diagnosis not present

## 2023-11-04 DIAGNOSIS — G894 Chronic pain syndrome: Secondary | ICD-10-CM | POA: Diagnosis not present

## 2023-11-04 DIAGNOSIS — I890 Lymphedema, not elsewhere classified: Secondary | ICD-10-CM | POA: Diagnosis not present

## 2023-11-04 DIAGNOSIS — M48062 Spinal stenosis, lumbar region with neurogenic claudication: Secondary | ICD-10-CM | POA: Diagnosis not present

## 2023-11-04 DIAGNOSIS — M5416 Radiculopathy, lumbar region: Secondary | ICD-10-CM | POA: Diagnosis not present

## 2023-11-04 DIAGNOSIS — Z79899 Other long term (current) drug therapy: Secondary | ICD-10-CM | POA: Diagnosis not present

## 2023-12-17 DIAGNOSIS — G319 Degenerative disease of nervous system, unspecified: Secondary | ICD-10-CM | POA: Diagnosis not present

## 2023-12-17 DIAGNOSIS — N1832 Chronic kidney disease, stage 3b: Secondary | ICD-10-CM | POA: Diagnosis not present

## 2023-12-17 DIAGNOSIS — I700 Atherosclerosis of aorta: Secondary | ICD-10-CM | POA: Diagnosis not present

## 2023-12-17 DIAGNOSIS — N281 Cyst of kidney, acquired: Secondary | ICD-10-CM | POA: Diagnosis not present

## 2024-01-17 DIAGNOSIS — E279 Disorder of adrenal gland, unspecified: Secondary | ICD-10-CM | POA: Diagnosis not present

## 2024-01-17 DIAGNOSIS — R609 Edema, unspecified: Secondary | ICD-10-CM | POA: Diagnosis not present

## 2024-01-17 DIAGNOSIS — M810 Age-related osteoporosis without current pathological fracture: Secondary | ICD-10-CM | POA: Diagnosis not present

## 2024-01-17 DIAGNOSIS — E1142 Type 2 diabetes mellitus with diabetic polyneuropathy: Secondary | ICD-10-CM | POA: Diagnosis not present

## 2024-01-17 DIAGNOSIS — Z87442 Personal history of urinary calculi: Secondary | ICD-10-CM | POA: Diagnosis not present

## 2024-01-17 DIAGNOSIS — Z8781 Personal history of (healed) traumatic fracture: Secondary | ICD-10-CM | POA: Diagnosis not present

## 2024-01-17 DIAGNOSIS — E039 Hypothyroidism, unspecified: Secondary | ICD-10-CM | POA: Diagnosis not present

## 2024-01-17 DIAGNOSIS — N1832 Chronic kidney disease, stage 3b: Secondary | ICD-10-CM | POA: Diagnosis not present

## 2024-02-15 DIAGNOSIS — R600 Localized edema: Secondary | ICD-10-CM | POA: Diagnosis not present

## 2024-02-15 DIAGNOSIS — N1832 Chronic kidney disease, stage 3b: Secondary | ICD-10-CM | POA: Diagnosis not present

## 2024-02-16 DIAGNOSIS — I10 Essential (primary) hypertension: Secondary | ICD-10-CM | POA: Diagnosis not present

## 2024-02-16 DIAGNOSIS — K219 Gastro-esophageal reflux disease without esophagitis: Secondary | ICD-10-CM | POA: Diagnosis not present

## 2024-02-16 DIAGNOSIS — R609 Edema, unspecified: Secondary | ICD-10-CM | POA: Diagnosis not present

## 2024-02-16 DIAGNOSIS — N1832 Chronic kidney disease, stage 3b: Secondary | ICD-10-CM | POA: Diagnosis not present

## 2024-02-25 DIAGNOSIS — N1832 Chronic kidney disease, stage 3b: Secondary | ICD-10-CM | POA: Diagnosis not present

## 2024-03-08 DIAGNOSIS — M5416 Radiculopathy, lumbar region: Secondary | ICD-10-CM | POA: Diagnosis not present

## 2024-03-08 DIAGNOSIS — M48062 Spinal stenosis, lumbar region with neurogenic claudication: Secondary | ICD-10-CM | POA: Diagnosis not present

## 2024-03-08 DIAGNOSIS — Z5181 Encounter for therapeutic drug level monitoring: Secondary | ICD-10-CM | POA: Diagnosis not present

## 2024-03-18 ENCOUNTER — Inpatient Hospital Stay (HOSPITAL_COMMUNITY)
Admission: EM | Admit: 2024-03-18 | Discharge: 2024-03-23 | DRG: 683 | Disposition: A | Discharge status: Home Health | Attending: Student in an Organized Health Care Education/Training Program | Admitting: Student in an Organized Health Care Education/Training Program

## 2024-03-18 ENCOUNTER — Emergency Department (HOSPITAL_COMMUNITY)

## 2024-03-18 ENCOUNTER — Encounter (HOSPITAL_COMMUNITY): Payer: Self-pay | Admitting: Emergency Medicine

## 2024-03-18 ENCOUNTER — Observation Stay (HOSPITAL_COMMUNITY)

## 2024-03-18 ENCOUNTER — Other Ambulatory Visit: Payer: Self-pay

## 2024-03-18 DIAGNOSIS — Z9884 Bariatric surgery status: Secondary | ICD-10-CM | POA: Diagnosis not present

## 2024-03-18 DIAGNOSIS — R933 Abnormal findings on diagnostic imaging of other parts of digestive tract: Secondary | ICD-10-CM | POA: Diagnosis not present

## 2024-03-18 DIAGNOSIS — I10 Essential (primary) hypertension: Secondary | ICD-10-CM | POA: Diagnosis not present

## 2024-03-18 DIAGNOSIS — R1013 Epigastric pain: Secondary | ICD-10-CM

## 2024-03-18 DIAGNOSIS — Z8249 Family history of ischemic heart disease and other diseases of the circulatory system: Secondary | ICD-10-CM

## 2024-03-18 DIAGNOSIS — Z83438 Family history of other disorder of lipoprotein metabolism and other lipidemia: Secondary | ICD-10-CM

## 2024-03-18 DIAGNOSIS — K289 Gastrojejunal ulcer, unspecified as acute or chronic, without hemorrhage or perforation: Secondary | ICD-10-CM

## 2024-03-18 DIAGNOSIS — E669 Obesity, unspecified: Secondary | ICD-10-CM | POA: Diagnosis present

## 2024-03-18 DIAGNOSIS — Z7984 Long term (current) use of oral hypoglycemic drugs: Secondary | ICD-10-CM

## 2024-03-18 DIAGNOSIS — K219 Gastro-esophageal reflux disease without esophagitis: Secondary | ICD-10-CM | POA: Diagnosis present

## 2024-03-18 DIAGNOSIS — R079 Chest pain, unspecified: Secondary | ICD-10-CM | POA: Diagnosis not present

## 2024-03-18 DIAGNOSIS — N1832 Chronic kidney disease, stage 3b: Secondary | ICD-10-CM | POA: Diagnosis present

## 2024-03-18 DIAGNOSIS — Z9841 Cataract extraction status, right eye: Secondary | ICD-10-CM

## 2024-03-18 DIAGNOSIS — Z6829 Body mass index (BMI) 29.0-29.9, adult: Secondary | ICD-10-CM

## 2024-03-18 DIAGNOSIS — F32A Depression, unspecified: Secondary | ICD-10-CM | POA: Diagnosis present

## 2024-03-18 DIAGNOSIS — Z79899 Other long term (current) drug therapy: Secondary | ICD-10-CM

## 2024-03-18 DIAGNOSIS — N179 Acute kidney failure, unspecified: Principal | ICD-10-CM | POA: Diagnosis present

## 2024-03-18 DIAGNOSIS — S2242XA Multiple fractures of ribs, left side, initial encounter for closed fracture: Secondary | ICD-10-CM | POA: Diagnosis not present

## 2024-03-18 DIAGNOSIS — Z8673 Personal history of transient ischemic attack (TIA), and cerebral infarction without residual deficits: Secondary | ICD-10-CM

## 2024-03-18 DIAGNOSIS — E039 Hypothyroidism, unspecified: Secondary | ICD-10-CM | POA: Diagnosis present

## 2024-03-18 DIAGNOSIS — K259 Gastric ulcer, unspecified as acute or chronic, without hemorrhage or perforation: Secondary | ICD-10-CM | POA: Diagnosis present

## 2024-03-18 DIAGNOSIS — Z833 Family history of diabetes mellitus: Secondary | ICD-10-CM

## 2024-03-18 DIAGNOSIS — Z90722 Acquired absence of ovaries, bilateral: Secondary | ICD-10-CM

## 2024-03-18 DIAGNOSIS — E876 Hypokalemia: Secondary | ICD-10-CM | POA: Diagnosis present

## 2024-03-18 DIAGNOSIS — Z90710 Acquired absence of both cervix and uterus: Secondary | ICD-10-CM

## 2024-03-18 DIAGNOSIS — E119 Type 2 diabetes mellitus without complications: Secondary | ICD-10-CM

## 2024-03-18 DIAGNOSIS — N3000 Acute cystitis without hematuria: Secondary | ICD-10-CM | POA: Diagnosis not present

## 2024-03-18 DIAGNOSIS — R1084 Generalized abdominal pain: Secondary | ICD-10-CM | POA: Diagnosis not present

## 2024-03-18 DIAGNOSIS — Z882 Allergy status to sulfonamides status: Secondary | ICD-10-CM

## 2024-03-18 DIAGNOSIS — Z9842 Cataract extraction status, left eye: Secondary | ICD-10-CM

## 2024-03-18 DIAGNOSIS — G4730 Sleep apnea, unspecified: Secondary | ICD-10-CM | POA: Diagnosis present

## 2024-03-18 DIAGNOSIS — E1122 Type 2 diabetes mellitus with diabetic chronic kidney disease: Secondary | ICD-10-CM | POA: Diagnosis present

## 2024-03-18 DIAGNOSIS — K8010 Calculus of gallbladder with chronic cholecystitis without obstruction: Secondary | ICD-10-CM | POA: Diagnosis present

## 2024-03-18 DIAGNOSIS — R0789 Other chest pain: Secondary | ICD-10-CM | POA: Diagnosis not present

## 2024-03-18 DIAGNOSIS — Z888 Allergy status to other drugs, medicaments and biological substances status: Secondary | ICD-10-CM

## 2024-03-18 DIAGNOSIS — Z5320 Procedure and treatment not carried out because of patient's decision for unspecified reasons: Secondary | ICD-10-CM | POA: Diagnosis present

## 2024-03-18 DIAGNOSIS — K8070 Calculus of gallbladder and bile duct without cholecystitis without obstruction: Secondary | ICD-10-CM | POA: Diagnosis not present

## 2024-03-18 DIAGNOSIS — R1111 Vomiting without nausea: Secondary | ICD-10-CM | POA: Diagnosis not present

## 2024-03-18 DIAGNOSIS — F17210 Nicotine dependence, cigarettes, uncomplicated: Secondary | ICD-10-CM | POA: Diagnosis present

## 2024-03-18 DIAGNOSIS — Z96653 Presence of artificial knee joint, bilateral: Secondary | ICD-10-CM | POA: Diagnosis present

## 2024-03-18 DIAGNOSIS — J9811 Atelectasis: Secondary | ICD-10-CM | POA: Diagnosis not present

## 2024-03-18 DIAGNOSIS — N281 Cyst of kidney, acquired: Secondary | ICD-10-CM | POA: Diagnosis not present

## 2024-03-18 DIAGNOSIS — K838 Other specified diseases of biliary tract: Secondary | ICD-10-CM | POA: Diagnosis not present

## 2024-03-18 DIAGNOSIS — R918 Other nonspecific abnormal finding of lung field: Secondary | ICD-10-CM | POA: Diagnosis not present

## 2024-03-18 DIAGNOSIS — Z79890 Hormone replacement therapy (postmenopausal): Secondary | ICD-10-CM

## 2024-03-18 DIAGNOSIS — I129 Hypertensive chronic kidney disease with stage 1 through stage 4 chronic kidney disease, or unspecified chronic kidney disease: Secondary | ICD-10-CM | POA: Diagnosis present

## 2024-03-18 DIAGNOSIS — R1310 Dysphagia, unspecified: Secondary | ICD-10-CM | POA: Diagnosis present

## 2024-03-18 DIAGNOSIS — E785 Hyperlipidemia, unspecified: Secondary | ICD-10-CM | POA: Diagnosis present

## 2024-03-18 DIAGNOSIS — R634 Abnormal weight loss: Secondary | ICD-10-CM

## 2024-03-18 DIAGNOSIS — R110 Nausea: Secondary | ICD-10-CM | POA: Diagnosis not present

## 2024-03-18 DIAGNOSIS — R197 Diarrhea, unspecified: Secondary | ICD-10-CM | POA: Diagnosis not present

## 2024-03-18 DIAGNOSIS — N178 Other acute kidney failure: Secondary | ICD-10-CM | POA: Diagnosis not present

## 2024-03-18 DIAGNOSIS — Z83719 Family history of colon polyps, unspecified: Secondary | ICD-10-CM

## 2024-03-18 DIAGNOSIS — Z87442 Personal history of urinary calculi: Secondary | ICD-10-CM

## 2024-03-18 DIAGNOSIS — K8020 Calculus of gallbladder without cholecystitis without obstruction: Secondary | ICD-10-CM | POA: Diagnosis not present

## 2024-03-18 LAB — CBC WITH DIFFERENTIAL/PLATELET
Abs Immature Granulocytes: 0.04 K/uL (ref 0.00–0.07)
Basophils Absolute: 0.1 K/uL (ref 0.0–0.1)
Basophils Relative: 0 %
Eosinophils Absolute: 0.1 K/uL (ref 0.0–0.5)
Eosinophils Relative: 1 %
HCT: 43 % (ref 36.0–46.0)
Hemoglobin: 14.2 g/dL (ref 12.0–15.0)
Immature Granulocytes: 0 %
Lymphocytes Relative: 11 %
Lymphs Abs: 1.6 K/uL (ref 0.7–4.0)
MCH: 33.1 pg (ref 26.0–34.0)
MCHC: 33 g/dL (ref 30.0–36.0)
MCV: 100.2 fL — ABNORMAL HIGH (ref 80.0–100.0)
Monocytes Absolute: 1.1 K/uL — ABNORMAL HIGH (ref 0.1–1.0)
Monocytes Relative: 8 %
Neutro Abs: 11.3 K/uL — ABNORMAL HIGH (ref 1.7–7.7)
Neutrophils Relative %: 80 %
Platelets: 323 K/uL (ref 150–400)
RBC: 4.29 MIL/uL (ref 3.87–5.11)
RDW: 12.7 % (ref 11.5–15.5)
WBC: 14.1 K/uL — ABNORMAL HIGH (ref 4.0–10.5)
nRBC: 0 % (ref 0.0–0.2)

## 2024-03-18 LAB — URINALYSIS, ROUTINE W REFLEX MICROSCOPIC
Bilirubin Urine: NEGATIVE
Glucose, UA: NEGATIVE mg/dL
Hgb urine dipstick: NEGATIVE
Ketones, ur: NEGATIVE mg/dL
Nitrite: NEGATIVE
Protein, ur: NEGATIVE mg/dL
Specific Gravity, Urine: 1.017 (ref 1.005–1.030)
pH: 5 (ref 5.0–8.0)

## 2024-03-18 LAB — COMPREHENSIVE METABOLIC PANEL WITH GFR
ALT: 17 U/L (ref 0–44)
AST: 25 U/L (ref 15–41)
Albumin: 3.5 g/dL (ref 3.5–5.0)
Alkaline Phosphatase: 38 U/L (ref 38–126)
Anion gap: 19 — ABNORMAL HIGH (ref 5–15)
BUN: 66 mg/dL — ABNORMAL HIGH (ref 8–23)
CO2: 25 mmol/L (ref 22–32)
Calcium: 9.9 mg/dL (ref 8.9–10.3)
Chloride: 91 mmol/L — ABNORMAL LOW (ref 98–111)
Creatinine, Ser: 2.35 mg/dL — ABNORMAL HIGH (ref 0.44–1.00)
GFR, Estimated: 21 mL/min — ABNORMAL LOW (ref >60)
Glucose, Bld: 149 mg/dL — ABNORMAL HIGH (ref 70–99)
Potassium: 3.7 mmol/L (ref 3.5–5.1)
Sodium: 135 mmol/L (ref 135–145)
Total Bilirubin: 0.9 mg/dL (ref 0.0–1.2)
Total Protein: 7 g/dL (ref 6.5–8.1)

## 2024-03-18 LAB — TROPONIN I (HIGH SENSITIVITY)
Troponin I (High Sensitivity): 13 ng/L (ref <18)
Troponin I (High Sensitivity): 13 ng/L (ref <18)

## 2024-03-18 LAB — PROTEIN / CREATININE RATIO, URINE
Creatinine, Urine: 100 mg/dL
Protein Creatinine Ratio: 0.11 mg/mg{creat} (ref 0.00–0.15)
Total Protein, Urine: 11 mg/dL

## 2024-03-18 LAB — LIPASE, BLOOD: Lipase: 68 U/L — ABNORMAL HIGH (ref 11–51)

## 2024-03-18 LAB — BRAIN NATRIURETIC PEPTIDE: B Natriuretic Peptide: 96.4 pg/mL (ref 0.0–100.0)

## 2024-03-18 LAB — GLUCOSE, CAPILLARY
Glucose-Capillary: 141 mg/dL — ABNORMAL HIGH (ref 70–99)
Glucose-Capillary: 162 mg/dL — ABNORMAL HIGH (ref 70–99)
Glucose-Capillary: 189 mg/dL — ABNORMAL HIGH (ref 70–99)

## 2024-03-18 LAB — SODIUM, URINE, RANDOM: Sodium, Ur: 45 mmol/L

## 2024-03-18 LAB — CREATININE, URINE, RANDOM: Creatinine, Urine: 99 mg/dL

## 2024-03-18 MED ORDER — INSULIN ASPART 100 UNIT/ML IJ SOLN
0.0000 [IU] | Freq: Three times a day (TID) | INTRAMUSCULAR | Status: DC
  Administered 2024-03-18: 2 [IU] via SUBCUTANEOUS
  Administered 2024-03-18 – 2024-03-19 (×3): 3 [IU] via SUBCUTANEOUS
  Administered 2024-03-19: 5 [IU] via SUBCUTANEOUS
  Administered 2024-03-20: 2 [IU] via SUBCUTANEOUS
  Administered 2024-03-20: 3 [IU] via SUBCUTANEOUS
  Administered 2024-03-21 (×2): 2 [IU] via SUBCUTANEOUS
  Administered 2024-03-22 (×2): 3 [IU] via SUBCUTANEOUS
  Administered 2024-03-23: 2 [IU] via SUBCUTANEOUS
  Administered 2024-03-23: 5 [IU] via SUBCUTANEOUS

## 2024-03-18 MED ORDER — MORPHINE SULFATE (PF) 2 MG/ML IV SOLN
2.0000 mg | INTRAVENOUS | Status: DC | PRN
  Administered 2024-03-18: 2 mg via INTRAVENOUS
  Filled 2024-03-18: qty 1

## 2024-03-18 MED ORDER — BOOST / RESOURCE BREEZE PO LIQD CUSTOM
1.0000 | Freq: Three times a day (TID) | ORAL | Status: DC
  Administered 2024-03-18 – 2024-03-20 (×6): 1 via ORAL

## 2024-03-18 MED ORDER — LEVOTHYROXINE SODIUM 88 MCG PO TABS
88.0000 ug | ORAL_TABLET | Freq: Every day | ORAL | Status: DC
  Administered 2024-03-18 – 2024-03-23 (×5): 88 ug via ORAL
  Filled 2024-03-18 (×7): qty 1

## 2024-03-18 MED ORDER — FAMOTIDINE IN NACL 20-0.9 MG/50ML-% IV SOLN
20.0000 mg | Freq: Once | INTRAVENOUS | Status: AC
  Administered 2024-03-18: 20 mg via INTRAVENOUS
  Filled 2024-03-18: qty 50

## 2024-03-18 MED ORDER — SODIUM CHLORIDE 0.9 % IV SOLN
1.0000 g | Freq: Once | INTRAVENOUS | Status: AC
  Administered 2024-03-18: 1 g via INTRAVENOUS
  Filled 2024-03-18: qty 10

## 2024-03-18 MED ORDER — ACETAMINOPHEN 325 MG PO TABS
650.0000 mg | ORAL_TABLET | Freq: Four times a day (QID) | ORAL | Status: DC | PRN
  Administered 2024-03-22 – 2024-03-23 (×2): 650 mg via ORAL
  Filled 2024-03-18 (×2): qty 2

## 2024-03-18 MED ORDER — OXYCODONE-ACETAMINOPHEN 10-325 MG PO TABS: 1.0000 | ORAL_TABLET | Freq: Four times a day (QID) | ORAL | Status: DC | PRN

## 2024-03-18 MED ORDER — OXYCODONE-ACETAMINOPHEN 5-325 MG PO TABS
1.0000 | ORAL_TABLET | Freq: Four times a day (QID) | ORAL | Status: DC | PRN
Start: 2024-03-18 — End: 2024-03-23
  Administered 2024-03-19 – 2024-03-23 (×13): 1 via ORAL
  Filled 2024-03-18 (×15): qty 1

## 2024-03-18 MED ORDER — HYDROCODONE-ACETAMINOPHEN 7.5-325 MG PO TABS
1.0000 | ORAL_TABLET | ORAL | Status: DC | PRN
  Administered 2024-03-18: 1 via ORAL
  Filled 2024-03-18: qty 1

## 2024-03-18 MED ORDER — OXYCODONE HCL 5 MG PO TABS
5.0000 mg | ORAL_TABLET | Freq: Four times a day (QID) | ORAL | Status: DC | PRN
Start: 2024-03-18 — End: 2024-03-23
  Administered 2024-03-18 – 2024-03-23 (×15): 5 mg via ORAL
  Filled 2024-03-18 (×17): qty 1

## 2024-03-18 MED ORDER — METRONIDAZOLE 500 MG/100ML IV SOLN
500.0000 mg | Freq: Two times a day (BID) | INTRAVENOUS | Status: DC
  Administered 2024-03-18 – 2024-03-23 (×10): 500 mg via INTRAVENOUS
  Filled 2024-03-18 (×11): qty 100

## 2024-03-18 MED ORDER — SODIUM CHLORIDE 0.9 % IV SOLN: INTRAVENOUS | Status: AC

## 2024-03-18 MED ORDER — SODIUM CHLORIDE 0.9 % IV SOLN: INTRAVENOUS | Status: DC

## 2024-03-18 MED ORDER — LEVOTHYROXINE SODIUM 88 MCG PO TABS: 88.0000 ug | ORAL_TABLET | Freq: Every day | ORAL | Status: DC

## 2024-03-18 MED ORDER — INFLUENZA VAC SPLIT HIGH-DOSE 0.5 ML IM SUSY
0.5000 mL | PREFILLED_SYRINGE | INTRAMUSCULAR | Status: DC
  Filled 2024-03-18: qty 0.5

## 2024-03-18 MED ORDER — SODIUM CHLORIDE 0.9 % IV SOLN
2.0000 g | INTRAVENOUS | Status: DC
  Administered 2024-03-19 – 2024-03-22 (×4): 2 g via INTRAVENOUS
  Filled 2024-03-18 (×4): qty 20

## 2024-03-18 MED ORDER — HYDROMORPHONE HCL 1 MG/ML IJ SOLN
0.5000 mg | INTRAMUSCULAR | Status: AC | PRN
  Administered 2024-03-18 – 2024-03-19 (×2): 0.5 mg via INTRAVENOUS
  Filled 2024-03-18 (×2): qty 0.5

## 2024-03-18 MED ORDER — SODIUM CHLORIDE 0.9 % IV SOLN: Freq: Once | INTRAVENOUS | Status: AC

## 2024-03-18 MED ORDER — ENOXAPARIN SODIUM 30 MG/0.3ML IJ SOSY
30.0000 mg | PREFILLED_SYRINGE | INTRAMUSCULAR | Status: DC
Start: 2024-03-18 — End: 2024-03-20
  Administered 2024-03-18 – 2024-03-20 (×3): 30 mg via SUBCUTANEOUS
  Filled 2024-03-18 (×3): qty 0.3

## 2024-03-18 MED ORDER — PROCHLORPERAZINE EDISYLATE 10 MG/2ML IJ SOLN: 5.0000 mg | Freq: Four times a day (QID) | INTRAMUSCULAR | Status: DC | PRN

## 2024-03-18 MED ORDER — METOPROLOL SUCCINATE ER 25 MG PO TB24
25.0000 mg | ORAL_TABLET | Freq: Every day | ORAL | Status: DC
  Administered 2024-03-18 – 2024-03-23 (×6): 25 mg via ORAL
  Filled 2024-03-18 (×6): qty 1

## 2024-03-18 MED ORDER — ACETAMINOPHEN 650 MG RE SUPP: 650.0000 mg | Freq: Four times a day (QID) | RECTAL | Status: DC | PRN

## 2024-03-18 NOTE — ED Notes (Signed)
 If patient not in uncontrolled Afib. Awaiting confirmation from ED nurse Heart rate ranging from 45 -130 per flowsheet.  03/18/24 1103  Hand-off documentation  Hand-off Received Ready to receive patient from the ED   Verdie JONETTA Collier, RN

## 2024-03-18 NOTE — H&P (Signed)
 History and Physical    Patient: Sharon Lowe FMW:[id number] DOB: 1945-06-06 DOA: 03/18/2024 DOS: the patient was seen and examined on 03/18/2024 PCP: Dwight Trula SQUIBB, MD  Patient coming from: Home  Chief Complaint:  Chief Complaint  Patient presents with   Epigastric Pain       HPI:  78 y.o. F with obesity, hx gastric bypass, HTN, HLD, DM, CKD 3B baseline 1.3-1.4 as recently as August, and recent treatment with Lasix for leg swelling who presents with upper abdominal pain, N/V, general weakness.  Claims 30-40 lb wt loss since starting Lasix.   In the ER SCr 2.35 (1.48 in June 2025), WBC 14.1, and lipase 68. CT demonstrates wall-thickening of afferent limb of gastric bypass. There is pyuria and bacteriuria on UA but no urinary sxs per ED PA. She was given Pepcid, IVF, and Rocephin .           Review of Systems  Constitutional:  Positive for malaise/fatigue and weight loss. Negative for chills and fever.  Respiratory:  Negative for cough and shortness of breath.   Cardiovascular:  Positive for leg swelling. Negative for chest pain, palpitations, orthopnea, claudication and PND.  Gastrointestinal:  Positive for abdominal pain, heartburn and nausea. Negative for blood in stool, constipation, diarrhea, melena and vomiting.  Genitourinary:  Negative for dysuria, frequency and urgency.  Musculoskeletal:  Positive for back pain.  Skin:  Negative for rash.  All other systems reviewed and are negative.    Past Medical History:  Diagnosis Date   Arthritis    Bilateral ureteral calculi    Depression    mild   GERD (gastroesophageal reflux disease)    Heart murmur    asymptomatic   History of kidney stones 2015   History of sleep apnea    hx of prior to gastric bypass, no problems since   History of TIA (transient ischemic attack)    ~2000-- no residual   Hyperlipidemia    Hypertension    Hypothyroidism    Pneumonia    hx of as a child   Type 2 diabetes mellitus (HCC)     Urgency of urination    Wears contact lenses    Past Surgical History:  Procedure Laterality Date   CARPAL TUNNEL RELEASE Right    CATARACT EXTRACTION Bilateral 2015   CYSTOSCOPY W/ URETERAL STENT PLACEMENT Bilateral 12/05/2013   Procedure: CYSTOSCOPY WITH RETROGRADE PYELOGRAM/URETERAL STENT PLACEMENT;  Surgeon: Morene LELON Salines, MD;  Location: [facility];  Service: Urology;  Laterality: Bilateral;   CYSTOSCOPY WITH URETEROSCOPY AND STENT PLACEMENT Bilateral 12/20/2013   Procedure: CYSTOSCOPY WITH BILATERAL URETEROSCOPY, LASER LITHOTRIPSY AND STENT EXCHANGE;  Surgeon: Morene LELON Salines, MD;  Location[facility];  Service: Urology;  Laterality: Bilateral;   EXPLORATOMY LAPAROTOMY FOR ABD. ABSCESS FROM MIGRATION OF IUD  1977   HOLMIUM LASER APPLICATION Bilateral 12/20/2013   Procedure: HOLMIUM LASER APPLICATION;  Surgeon: Morene LELON Salines, MD;  Location: [facility];  Service: Urology;  Laterality: Bilateral;   KNEE ARTHROSCOPY Right    LAPAROSCOPIC GASTRIC BYPASS  08-18-2010   AND REPAIR VENTRAL HERNIA   REVISION TOTAL KNEE ARTHROPLASTY Left 01-05-2005   SHOULDER OPEN ROTATOR CUFF REPAIR Left 07-12-2002   TONSILLECTOMY  CHILD   TOTAL ABDOMINAL HYSTERECTOMY W/ BILATERAL SALPINGOOPHORECTOMY  ~1995   TOTAL KNEE ARTHROPLASTY Left 11-17-2002   TOTAL KNEE ARTHROPLASTY Right 12/10/2014   Procedure: TOTAL KNEE ARTHROPLASTY;  Surgeon: Donnice Car, MD;  Location: [facility];  Service: Orthopedics;  Laterality:  Right;   TOTAL KNEE REVISION WITH SCAR DEBRIDEMENT/PATELLA REVISION WITH POLY EXCHANGE Left 10/30/2013   Procedure: LEFT  TOTAL  KNEE  REVISION;  Surgeon: Donnice JONETTA Car, MD;  Location: [facility];  Service: Orthopedics;  Laterality: Left;   Social History:  reports that she has been smoking cigarettes. She has a 22.5 pack-year smoking history. She has never used smokeless tobacco. She reports current alcohol use. She reports that she does not use drugs.  Allergies   Allergen Reactions   Sulfa Antibiotics Itching and Rash    Family History  Problem Relation Age of Onset   Diabetes Mother    Hypertension Mother    Hyperlipidemia Mother    Heart disease Father    Prostate cancer Maternal Uncle    Cancer Paternal Grandmother        unaware of what kind   Colon polyps Maternal Uncle     Prior to Admission medications   Medication Sig Start Date End Date Taking? Authorizing Provider  atorvastatin  (LIPITOR) 10 MG tablet Take 10 mg by mouth every evening.    [provider]  buPROPion  (WELLBUTRIN  SR) 100 MG 12 hr tablet Take 100 mg by mouth every morning.    [provider]  Calcium  Carb-Cholecalciferol (CALCIUM  600+D) 600-800 MG-UNIT TABS Take 2 tablets by mouth at bedtime.     [provider]  Cyanocobalamin  (VITAMIN B-12) 2500 MCG SUBL Place 2,500 mcg under the tongue every morning.     [provider]  docusate sodium  (COLACE) 100 MG capsule Take 1 capsule (100 mg total) by mouth 2 (two) times daily. 12/12/14   Danella Donnice, PA-C  ferrous sulfate  325 (65 FE) MG tablet Take 1 tablet (325 mg total) by mouth 3 (three) times daily after meals. 12/12/14   Danella Donnice, PA-C  hydrochlorothiazide  (MICROZIDE ) 12.5 MG capsule Take 12.5 mg by mouth every morning.    [provider]  HYDROcodone -acetaminophen  (NORCO) 7.5-325 MG per tablet Take 1-2 tablets by mouth every 4 (four) hours as needed for moderate pain. 12/12/14   Danella Donnice, PA-C  levothyroxine  (SYNTHROID , LEVOTHROID) 88 MCG tablet Take 88 mcg by mouth daily before breakfast.  12/31/10   [provider]  Liraglutide  (VICTOZA ) 18 MG/3ML SOPN Inject 1.2 mg into the skin at bedtime.    [provider]  lisinopril  (PRINIVIL ,ZESTRIL ) 10 MG tablet Take 10 mg by mouth every morning.     [provider]  loratadine  (ALLERGY) 10 MG tablet Take 10 mg by mouth every morning.     [provider]  MAGNESIUM  PO Take 250 mg by  mouth every morning.     [provider]  metFORMIN  (GLUCOPHAGE ) 500 MG tablet Take 500 mg by mouth 2 (two) times daily with a meal.    [provider]  Multiple Vitamin (MULTIVITAMIN WITH MINERALS) TABS tablet Take 2 tablets by mouth every morning.     [provider]  Omega-3 Fatty Acids  (OMEGA-3 FISH OIL ) 1200 MG CAPS Take 1 capsule by mouth every morning.    [provider]  omeprazole  (PRILOSEC) 20 MG capsule Take 20 mg by mouth every morning.    [provider]  Polyethyl Glycol-Propyl Glycol (SYSTANE OP) Apply 1 drop to eye 3 (three) times daily as needed (dry eyes.).    [provider]  polyethylene glycol (MIRALAX  / GLYCOLAX ) packet Take 17 g by mouth 2 (two) times daily. 12/12/14   Danella Donnice, PA-C  Pyridoxine HCl (VITAMIN B-6 PO) Take 1 tablet  by mouth every morning.     [provider]  tiZANidine  (ZANAFLEX ) 4 MG tablet Take 1 tablet (4 mg total) by mouth every 6 (six) hours as needed for muscle spasms. 12/12/14   Danella Cough, PA-C    Physical Exam: Vitals:   03/18/24 1123 03/18/24 1124 03/18/24 1132 03/18/24 1211  BP:   134/73 (!) 144/54  Pulse: (!) 108 71 61 99  Resp: 13 14 19 16   Temp:   98.6 F (37 C) (!) 97.5 F (36.4 C)  TempSrc:    Oral  SpO2: 100% 97% 97% 95%  Weight:      Height:       Elderly adult female, lying in bed, interactive and appropriate, appears to be in discomfort Oropharynx moist, no oral lesions, lips normal, dentition in good repair Sclera anicteric, conjunctiva pink, lids and lashes normal, no nasal deformity, discharge, or epistaxis RRR, soft systolic murmur, no peripheral edema, no JVD Respiratory rate normal, lungs clear without rales or wheezes Abdomen with some epigastric tenderness to palpation, guarding, no rigidity, no rebound Attention normal, affect appropriate, oriented x 4 Face symmetric, speech fluent, upper extremities with normal strength and  coordination    Data Reviewed: Discussed with general surgery Basic metabolic panel shows creatinine 2.3 Outside records reviewed, shows baseline creatinine 1.4-1.6 Troponin negative, BNP normal CBC shows leukocytosis only, no anemia or thrombocytopenia EKG, personally reviewed, shows irregular rhythm, what appears to be P waves, suspect sinus arrhythmia or frequent PACs Chest x-ray, personally reviewed, shows no airspace disease or opacity CT abdomen and pelvis shows some haziness around the afferent limb of her Roux-en-Y, otherwise nothing other than some gallstones Right upper quadrant ultrasound shows thickening of the gallbladder wall, extensive gallbladder stones, tenderness to palpation of the gallbladder     Assessment and Plan: AKI on CKDIIIb Baseline appears to be in the 1.4-1.6 range in [place] notes, these are hard to read.  She has been referred to [facility], they actually took her off omeprazole  out of concern for AIN evidently.  There was recent Lasix use due to leg swelling. Dispense report includes Lipitor, Dulaglutide, furosemide, hydrochlorothiazide , LT4, metoprolol , omeprazole , Percocet and Telmisartan.  Denies NSAID use  CT abdomen here shows no hydronephrosis.  IVF UA  PC Fena I/Os Trend creatinine Hold Lasix, lisinopril , HCTZ, liraglutide , metformin     Epigastric pain pancreatitis vs cholecystitis Initially thought this was pancreatitis given epigastric pain, indigestion, and elevated lipase.  Nothing on imaging, and ultrasound discloses some thickening of the gallbladder. - Continue Rocephin  - Clear liquid diet - Consult general surgery, appreciate recommendations   Weight loss Patient reports weight loss, and indeed from outside records it seems that her weight back in September was 187, and here is 158.  Based on her recent Lasix, I suspect that this is water weight.  CT shows no obvious malignancy - Hold Lasix and HCTZ  Leg swelling Presumably this  is related to her renal failure, but no recent echo in our system - Obtain echocardiogram  Arrhythmia This appears to be sinus arrhythmia or frequent PACs - Monitor on telemetry  Hypertension Blood pressure near normal - Hold HCTZ, Lasix, lisinopril  given renal failure - Monitor creatinine  Hyperlipidemia -Hold Lipitor for now  Diabetes Glucose normal here -Hold liraglutide  and metformin  - Sliding scale corrections  Hypothyroidism -Continue levothyroxine         Advance Care Planning: Full code  Consults: General Surgery, Dr. Paola  Family Communication: Has no family  Severity of Illness: The  appropriate patient status for this patient is OBSERVATION. Observation status is judged to be reasonable and necessary in order to provide the required intensity of service to ensure the patient's safety. The patient's presenting symptoms, physical exam findings, and initial radiographic and laboratory data in the context of their medical condition is felt to place them at decreased risk for further clinical deterioration. Furthermore, it is anticipated that the patient will be medically stable for discharge from the hospital within 2 midnights of admission.   Author: Lonni SHAUNNA Dalton, MD 03/18/2024 1:58 PM  For on call review [email or web address].

## 2024-03-18 NOTE — Plan of Care (Signed)
  Problem: Coping: Goal: Ability to adjust to condition or change in health will improve Outcome: Progressing   Problem: Health Behavior/Discharge Planning: Goal: Ability to manage health-related needs will improve Outcome: Progressing   Problem: Clinical Measurements: Goal: Ability to maintain clinical measurements within normal limits will improve Outcome: Progressing   Problem: Pain Managment: Goal: General experience of comfort will improve and/or be controlled Outcome: Progressing

## 2024-03-18 NOTE — Consult Note (Addendum)
 Reason for Consult:  Cholecystitis Referring Provider: Jonel, MD  HPI  Sharon Lowe is an 78 y.o. female CKD, DM, hx of gastric bypass in 2012 (Dr. Gladis), HTN, AKI on CKD presenting with several days of vomiting, epigastric pain, and generalized abdominal tenderness.   CT imaging revealed dilated segment of small bowel in LUQ with small bowel wall thickening involving afferent loop proximal to JJ anastomosis. US  showed multiple gallstones and sludge with gallbladder wall thickening and positive sonographic Murphy's sign.  Labs notable for leukocytosis to 14.1 and AKI with creatinine of 2.35.  Of note, patient has also had exploratory laparotomy in the remote past for migrated IUD in addition to RNYGB.  Patient states she actually only had one episode of emesis a few days ago but yesterday evening began to have severe abdominal pain, primarily in the left mid abdomen but also in RUQ. Left abdominal pain worse than right.  10 point review of systems is negative except as listed above in HPI.  Objective  Past Medical History: Past Medical History:  Diagnosis Date   Arthritis    Bilateral ureteral calculi    Depression    mild   GERD (gastroesophageal reflux disease)    Heart murmur    asymptomatic   History of kidney stones 2015   History of sleep apnea    hx of prior to gastric bypass, no problems since   History of TIA (transient ischemic attack)    ~2000-- no residual   Hyperlipidemia    Hypertension    Hypothyroidism    Pneumonia    hx of as a child   Type 2 diabetes mellitus (HCC)    Urgency of urination    Wears contact lenses     Past Surgical History: Past Surgical History:  Procedure Laterality Date   CARPAL TUNNEL RELEASE Right    CATARACT EXTRACTION Bilateral 2015   CYSTOSCOPY W/ URETERAL STENT PLACEMENT Bilateral 12/05/2013   Procedure: CYSTOSCOPY WITH RETROGRADE PYELOGRAM/URETERAL STENT PLACEMENT;  Surgeon: Morene LELON Salines, MD;  Location: [facility];   Service: Urology;  Laterality: Bilateral;   CYSTOSCOPY WITH URETEROSCOPY AND STENT PLACEMENT Bilateral 12/20/2013   Procedure: CYSTOSCOPY WITH BILATERAL URETEROSCOPY, LASER LITHOTRIPSY AND STENT EXCHANGE;  Surgeon: Morene LELON Salines, MD;  Location: [facility];  Service: Urology;  Laterality: Bilateral;   EXPLORATOMY LAPAROTOMY FOR ABD. ABSCESS FROM MIGRATION OF IUD  1977   HOLMIUM LASER APPLICATION Bilateral 12/20/2013   Procedure: HOLMIUM LASER APPLICATION;  Surgeon: Morene LELON Salines, MD;  Location: [facility];  Service: Urology;  Laterality: Bilateral;   KNEE ARTHROSCOPY Right    LAPAROSCOPIC GASTRIC BYPASS  08-18-2010   AND REPAIR VENTRAL HERNIA   REVISION TOTAL KNEE ARTHROPLASTY Left 01-05-2005   SHOULDER OPEN ROTATOR CUFF REPAIR Left 07-12-2002   TONSILLECTOMY  CHILD   TOTAL ABDOMINAL HYSTERECTOMY W/ BILATERAL SALPINGOOPHORECTOMY  ~1995   TOTAL KNEE ARTHROPLASTY Left 11-17-2002   TOTAL KNEE ARTHROPLASTY Right 12/10/2014   Procedure: TOTAL KNEE ARTHROPLASTY;  Surgeon: Donnice Car, MD;  Location: [facility];  Service: Orthopedics;  Laterality: Right;   TOTAL KNEE REVISION WITH SCAR DEBRIDEMENT/PATELLA REVISION WITH POLY EXCHANGE Left 10/30/2013   Procedure: LEFT  TOTAL  KNEE  REVISION;  Surgeon: Donnice JONETTA Car, MD;  Location: [facility];  Service: Orthopedics;  Laterality: Left;    Family History:  Family History  Problem Relation Age of Onset   Diabetes Mother    Hypertension Mother    Hyperlipidemia Mother  Heart disease Father    Prostate cancer Maternal Uncle    Cancer Paternal Grandmother        unaware of what kind   Colon polyps Maternal Uncle     Social History:  reports that she has been smoking cigarettes. She has a 22.5 pack-year smoking history. She has never used smokeless tobacco. She reports current alcohol use. She reports that she does not use drugs.  Allergies:  Allergies  Allergen Reactions   Empagliflozin Other (See Comments)     Yeast infections    Sulfa Antibiotics Itching, Rash and Dermatitis    Medications: I have reviewed the patient's current medications.  Labs: I have personally reviewed all labs for the past 24h  Imaging: I have personally reviewed and interpreted all imaging for the past 24h and agree with the radiologist's impression.  US  Abdomen Complete Result Date: 03/18/2024 EXAM: COMPLETE ABDOMINAL ULTRASOUND TECHNIQUE: Real-time ultrasonography of the abdomen was performed. COMPARISON: CT of the abdomen and pelvis dated 04/30/2014. CLINICAL HISTORY: Epigastric pain. FINDINGS: LIVER: The liver is heterogeneously echogenic. No intrahepatic biliary ductal dilatation. No mass. BILIARY SYSTEM: There are multiple stones and sludge present within the gallbladder. The patient is also focally tender over the gallbladder. The gallbladder wall measures approximately 3.5 mm in thickness. Negative sonographic Murphy's sign. Common bile duct is within normal limits measuring 6 mm. KIDNEYS: Normal size and contour of kidneys. Normal cortical echogenicity. No hydronephrosis. No calculus. There is a simple exophytic cyst arising from the left kidney measuring approximately 2.6 cm in diameter. PANCREAS: Visualized portions of the pancreas are unremarkable. SPLEEN: No acute abnormality. Spleen is within normal limits in size. VESSELS: There is calcification within the wall of the abdominal aorta. Visualized portion of the inferior vena cava is normal. OTHER: No ascites. IMPRESSION: 1. Multiple gallstones and sludge with gallbladder wall thickening (3.5 mm) and focal tenderness, suggesting possible cholecystitis. 2. Heterogeneous echogenicity of the liver. 3. Simple exophytic cyst in the left kidney measuring 2.6 cm. 4. Calcification within the wall of the abdominal aorta. Electronically signed by: Evalene Coho MD 03/18/2024 11:26 AM EDT RP Workstation: [id number]   CT CHEST ABDOMEN PELVIS WO CONTRAST Result Date:  03/18/2024 EXAM: CT CHEST, ABDOMEN AND PELVIS WITHOUT CONTRAST 03/18/2024 03:35:00 AM TECHNIQUE: CT of the chest, abdomen and pelvis was performed without the administration of intravenous contrast. Multiplanar reformatted images are provided for review. Automated exposure control, iterative reconstruction, and/or weight based adjustment of the mA/kV was utilized to reduce the radiation dose to as low as reasonably achievable. COMPARISON: CT of the chest dated 10/27/2022. CLINICAL HISTORY: RLQ abdominal pain. Patient had difficulty holding still and laying flat. Chief complaints: Chest Pain. FINDINGS: CHEST: MEDIASTINUM AND LYMPH NODES: Heart and pericardium are unremarkable. The central airways are clear. No mediastinal, hilar or axillary lymphadenopathy. There is moderate calcific coronary artery disease. There is moderate calcific plaque also present within the aortic arch and descending thoracic aorta. LUNGS AND PLEURA: Minimal atelectasis present dependently within the lungs. No pleural effusion or pneumothorax. ABDOMEN AND PELVIS: LIVER: The liver is unremarkable. GALLBLADDER AND BILE DUCTS: There are stones and sludge within the gallbladder. No biliary ductal dilatation. SPLEEN: No acute abnormality. PANCREAS: No acute abnormality. ADRENAL GLANDS: No acute abnormality. KIDNEYS, URETERS AND BLADDER: There is a simple exophytic cyst arising from the lower pole of the left kidney, measuring approximately 3.5 cm in diameter. Per consensus, no follow-up is needed for simple Bosniak type 1 and 2 renal cysts, unless the patient has a  malignancy history or risk factors. No stones in the kidneys or ureters. No hydronephrosis. No perinephric or periureteral stranding. Urinary bladder is unremarkable. GI AND BOWEL: The patient is status post gastric bypass surgery. There is a dilated segment of small bowel in the left upper quadrant, measuring approximately 3.4 cm in diameter. There is also small bowel wall thickening  involving the afferent loop proximal to the jejunal anastomosis. Stomach demonstrates no acute abnormality. There is no bowel obstruction. REPRODUCTIVE ORGANS: The patient is status post hysterectomy and bilateral salpingo-oophorectomy. PERITONEUM AND RETROPERITONEUM: No ascites. No free air. VASCULATURE: Aorta is normal in caliber. ABDOMINAL AND PELVIS LYMPH NODES: No lymphadenopathy. BONES AND SOFT TISSUES: Since the previous study, the patient has developed moderate-to-severe compression deformity of L1, but appears to be chronic. The patient is status post vertebral augmentation of a compression deformity of T11, as before. No focal soft tissue abnormality. IMPRESSION: 1. Small bowel wall thickening involving the afferent loop proximal to the jejunal anastomosis and a dilated segment of small bowel in the left upper quadrant, measuring approximately 3.4 cm in diameter. 2. Stones and sludge within the gallbladder. 3. Moderate calcific coronary artery disease and moderate calcific plaque within the aortic arch and descending thoracic aorta. Electronically signed by: Evalene Coho MD 03/18/2024 05:00 AM EDT RP Workstation: [id number]   DG Chest Portable 1 View Result Date: 03/18/2024 EXAM: 1 VIEW(S) XRAY OF THE CHEST 03/18/2024 01:48:02 AM COMPARISON: None available. CLINICAL HISTORY: chest pain. Chest pain; Patient unable to tolerate pressure from xray cassette FINDINGS: LUNGS AND PLEURA: Associated pleural thickening or small left pleural effusion. No focal pulmonary opacity. No pulmonary edema. No pneumothorax. HEART AND MEDIASTINUM: No acute abnormality of the cardiac and mediastinal silhouettes. BONES AND SOFT TISSUES: Chronic left rib fractures. IMPRESSION: 1. No acute cardiopulmonary process. 2. Chronic left fractures with associated small left pleural effusion or pleural thickening. Electronically signed by: Norman Gatlin MD 03/18/2024 01:52 AM EDT RP Workstation: [id number]     Physical  Exam Blood pressure (!) 144/66, pulse 63, temperature 98.2 F (36.8 C), resp. rate 16, height 5' 2 (1.575 m), weight 72.2 kg, SpO2 97%. General: No acute distress HEENT: normocephalic, atraumatic Oropharynx: mucous membranes dry CV: Regular rate, hypertensive Chest: equal chest rise bilaterally normal respiratory effort on room air Abdomen: soft and nondistended, tender to palpation mildly in RUQ, moderate tenderness to palpation in left mid abdomen Extremities: moves all extremities Skin: warm, dry, no rashes Psych: normal memory, normal mood/affect  Neuro: No focal neurologic deficits, A&Ox3    Assessment   ASHANTY COLTRANE is an 78 y.o. female with CKD, DM, hx of gastric bypass in 2012, HTN, AKI on CKD presenting with several days of vomiting, epigastric pain, and generalized abdominal tenderness. Imaging concerning for afferent limb dilation/thickening and cholecystitis with cholelithiasis.  Plan  - Agree with echocardiogram ordered by primary team  - Will await this results before proceeding with any surgical interventions - Continue ceftriaxone , add flagyl given small bowel thickening - Will discuss with on call team in AM, given dilation and thickening of afferent limb, plan to order UGI with SBFT in AM and may engage GI pending results for EGD. I am concerned she may have two separate processes including her gallbladder and potential dysfunction of afferent limb versus narrowing of JJ anastomosis - FEN - ok for sips of clears (1 cup water/juice per hour), sips with meds, ice chips - DVT - SCDs, ok for DVT ppx from surgical perspective - Dispo - med-surg  I reviewed ED provider notes, hospitalist notes, last 24 h vitals and pain scores, last 48 h intake and output, last 24 h labs and trends, and last 24 h imaging results. I reviewed previous RNYGB surgical op notes. I relayed recommendations to [facility] via epic chat  This care required high  level of medical decision making.     Orie Silversmith, MD [facility] Surgery

## 2024-03-18 NOTE — Hospital Course (Addendum)
 78 y.o. F with obesity, hx gastric bypass, HTN, HLD, DM, CKD IIIb baseline 1.3-1.4 as recently as August, and recent treatment with Lasix for leg swelling who presents with upper abdominal pain, N/V, general weakness.     In the ER, Cr 2.35, WBC 14.1, and lipase 68. CT showed wall-thickening of afferent limb of gastric bypass, gallstones.   Admitted for AKI, abdominal pain.  Abdominal pain worsened, Gen Surg and GI consulted.

## 2024-03-18 NOTE — ED Notes (Signed)
 Patient still in CT at this time.

## 2024-03-18 NOTE — ED Notes (Signed)
 PT REPORTS SHE AMBULATES WITH A ROLLATOR AT HOME.  SHE REPORTS STANDING AND WALKING DOWN THE RAMP TO THE EMS STRETCHER PRIOR TO COMING TO THE HOSPITAL.

## 2024-03-18 NOTE — Progress Notes (Signed)
 Patient arrived to the floor from the ED at approximately 1200. Arrived to the floor in stable condition. Alert and oriented x4. Oriented patient to unit and room. Provided her with the call bell and educated her regarding the use of the call bell. Denies having discomfort. Placed bed in lowest position with HOB elevated. Call bell within reach.

## 2024-03-18 NOTE — ED Triage Notes (Addendum)
 Pt called EMS from home for epigastric pain starting at rest. Pressure. N,V,D and decreased appetite. Significant weight loss past few days per friend. Lives alone. No hx of afib but EMS found her to be rate controlled fib. Still reporting pain. Alert and oriented x 4.

## 2024-03-18 NOTE — ED Provider Notes (Signed)
 [place] EMERGENCY DEPARTMENT AT [facility] Provider Note   CSN: [id number] Arrival date & time: 03/18/24  0125     Patient presents with: Chest Pain   Sharon Lowe is a 78 y.o. female.  Patient with past medical history significant for DM, gastric bypass in 2012, hypertension, CKD stage IIIb with most recent creatinine approximately 1.6, Lasix prescription for fluid overload presents the emergency department with chief complaint of epigastric abdominal pain.  She states this pain has been intermittent for a few days.  She used to take omeprazole  but was discontinued because nephrology thought it may be causing acute interstitial nephritis.  She has been on Lasix for 3 to 4 weeks and has lost a reported 30 to 40 pounds since starting the Lasix.  She denies shortness of breath and chest pain.  She does endorse vomiting over the past few days, epigastric burning, abdominal tenderness.  EMS reported atrial fibrillation on their monitor but upon arrival in our department she was noted to be in sinus rhythm.    Chest Pain      Prior to Admission medications   Medication Sig Start Date End Date Taking? Authorizing Provider  atorvastatin  (LIPITOR) 10 MG tablet Take 10 mg by mouth every evening.    [provider]  buPROPion  (WELLBUTRIN  SR) 100 MG 12 hr tablet Take 100 mg by mouth every morning.    [provider]  Calcium  Carb-Cholecalciferol (CALCIUM  600+D) 600-800 MG-UNIT TABS Take 2 tablets by mouth at bedtime.     [provider]  Cyanocobalamin  (VITAMIN B-12) 2500 MCG SUBL Place 2,500 mcg under the tongue every morning.     [provider]  docusate sodium  (COLACE) 100 MG capsule Take 1 capsule (100 mg total) by mouth 2 (two) times daily. 12/12/14   Danella Cough, PA-C  ferrous sulfate  325 (65 FE) MG tablet Take 1 tablet (325 mg total) by mouth 3 (three) times daily after meals. 12/12/14   Danella Cough, PA-C  hydrochlorothiazide   (MICROZIDE ) 12.5 MG capsule Take 12.5 mg by mouth every morning.    [provider]  HYDROcodone -acetaminophen  (NORCO) 7.5-325 MG per tablet Take 1-2 tablets by mouth every 4 (four) hours as needed for moderate pain. 12/12/14   Danella Cough, PA-C  levothyroxine  (SYNTHROID , LEVOTHROID) 88 MCG tablet Take 88 mcg by mouth daily before breakfast.  12/31/10   [provider]  Liraglutide  (VICTOZA ) 18 MG/3ML SOPN Inject 1.2 mg into the skin at bedtime.    [provider]  lisinopril  (PRINIVIL ,ZESTRIL ) 10 MG tablet Take 10 mg by mouth every morning.     [provider]  loratadine  (ALLERGY) 10 MG tablet Take 10 mg by mouth every morning.     [provider]  MAGNESIUM  PO Take 250 mg by mouth every morning.     [provider]  metFORMIN  (GLUCOPHAGE ) 500 MG tablet Take 500 mg by mouth 2 (two) times daily with a meal.    [provider]  Multiple Vitamin (MULTIVITAMIN WITH MINERALS) TABS tablet Take 2 tablets by mouth every morning.     [provider]  Omega-3 Fatty Acids  (OMEGA-3 FISH OIL ) 1200 MG CAPS Take 1 capsule by mouth every morning.    [provider]  omeprazole  (PRILOSEC) 20 MG capsule Take 20 mg by mouth every morning.    [provider]  Polyethyl Glycol-Propyl Glycol (SYSTANE OP) Apply 1 drop to eye 3 (three) times daily as needed (dry eyes.).    [provider]  polyethylene glycol (MIRALAX  / GLYCOLAX ) packet Take 17 g by mouth 2 (two) times daily. 12/12/14   Danella Cough, PA-C  Pyridoxine HCl (VITAMIN B-6 PO) Take 1 tablet by mouth every morning.     [provider]  tiZANidine  (ZANAFLEX ) 4 MG tablet Take 1 tablet (4 mg total) by mouth every 6 (six) hours as needed for muscle spasms. 12/12/14   Danella Cough, PA-C    Allergies: Sulfa antibiotics    Review of Systems  Cardiovascular:  Positive for chest pain.    Updated Vital Signs BP (!) 147/49   Pulse 66   Temp 98.6 F (37  C) (Oral)   Resp (!) 21   SpO2 96%   Physical Exam Vitals and nursing note reviewed.  Constitutional:      General: She is not in acute distress.    Appearance: She is well-developed.  HENT:     Head: Normocephalic and atraumatic.  Eyes:     Conjunctiva/sclera: Conjunctivae normal.  Cardiovascular:     Rate and Rhythm: Normal rate and regular rhythm.  Pulmonary:     Effort: Pulmonary effort is normal. No respiratory distress.     Breath sounds: Normal breath sounds.  Chest:     Chest wall: No tenderness.  Abdominal:     Palpations: Abdomen is soft.     Tenderness: There is abdominal tenderness (Generalized).  Musculoskeletal:        General: No swelling.     Cervical back: Neck supple.     Right lower leg: Edema present.     Left lower leg: Edema present.  Skin:    General: Skin is warm and dry.     Capillary Refill: Capillary refill takes less than 2 seconds.  Neurological:     Mental Status: She is alert.  Psychiatric:        Mood and Affect: Mood normal.     (all labs ordered are listed, but only abnormal results are displayed) Labs Reviewed  COMPREHENSIVE METABOLIC PANEL WITH GFR - Abnormal; Notable for the following components:      Result Value   Chloride 91 (*)    Glucose, Bld 149 (*)    BUN 66 (*)    Creatinine, Ser 2.35 (*)    GFR, Estimated 21 (*)    Anion gap 19 (*)    All other components within normal limits  CBC WITH DIFFERENTIAL/PLATELET - Abnormal; Notable for the following components:   WBC 14.1 (*)    MCV 100.2 (*)    Neutro Abs 11.3 (*)    Monocytes Absolute 1.1 (*)    All other components within normal limits  LIPASE, BLOOD - Abnormal; Notable for the following components:   Lipase 68 (*)    All other components within normal limits  URINALYSIS, ROUTINE W REFLEX MICROSCOPIC - Abnormal; Notable for the following components:   Leukocytes,Ua LARGE (*)    Bacteria, UA MANY (*)    All other components within normal limits  BRAIN  NATRIURETIC PEPTIDE  TROPONIN I (HIGH SENSITIVITY)  TROPONIN I (HIGH SENSITIVITY)    EKG: EKG Interpretation Date/Time:  Saturday March 18 2024 01:42:18 EDT Ventricular Rate:  75 PR Interval:  163 QRS Duration:  90 QT Interval:  402 QTC Calculation: 443 R Axis:   10  Text Interpretation: Sinus rhythm Supraventricular bigeminy Low voltage, precordial leads No significant change was found Confirmed by Carita Senior [phone]) on 03/18/2024 1:45:31 AM  Radiology: CT CHEST ABDOMEN PELVIS WO CONTRAST Result  Date: 03/18/2024 EXAM: CT CHEST, ABDOMEN AND PELVIS WITHOUT CONTRAST 03/18/2024 03:35:00 AM TECHNIQUE: CT of the chest, abdomen and pelvis was performed without the administration of intravenous contrast. Multiplanar reformatted images are provided for review. Automated exposure control, iterative reconstruction, and/or weight based adjustment of the mA/kV was utilized to reduce the radiation dose to as low as reasonably achievable. COMPARISON: CT of the chest dated 10/27/2022. CLINICAL HISTORY: RLQ abdominal pain. Patient had difficulty holding still and laying flat. Chief complaints: Chest Pain. FINDINGS: CHEST: MEDIASTINUM AND LYMPH NODES: Heart and pericardium are unremarkable. The central airways are clear. No mediastinal, hilar or axillary lymphadenopathy. There is moderate calcific coronary artery disease. There is moderate calcific plaque also present within the aortic arch and descending thoracic aorta. LUNGS AND PLEURA: Minimal atelectasis present dependently within the lungs. No pleural effusion or pneumothorax. ABDOMEN AND PELVIS: LIVER: The liver is unremarkable. GALLBLADDER AND BILE DUCTS: There are stones and sludge within the gallbladder. No biliary ductal dilatation. SPLEEN: No acute abnormality. PANCREAS: No acute abnormality. ADRENAL GLANDS: No acute abnormality. KIDNEYS, URETERS AND BLADDER: There is a simple exophytic cyst arising from the lower pole of the left kidney,  measuring approximately 3.5 cm in diameter. Per consensus, no follow-up is needed for simple Bosniak type 1 and 2 renal cysts, unless the patient has a malignancy history or risk factors. No stones in the kidneys or ureters. No hydronephrosis. No perinephric or periureteral stranding. Urinary bladder is unremarkable. GI AND BOWEL: The patient is status post gastric bypass surgery. There is a dilated segment of small bowel in the left upper quadrant, measuring approximately 3.4 cm in diameter. There is also small bowel wall thickening involving the afferent loop proximal to the jejunal anastomosis. Stomach demonstrates no acute abnormality. There is no bowel obstruction. REPRODUCTIVE ORGANS: The patient is status post hysterectomy and bilateral salpingo-oophorectomy. PERITONEUM AND RETROPERITONEUM: No ascites. No free air. VASCULATURE: Aorta is normal in caliber. ABDOMINAL AND PELVIS LYMPH NODES: No lymphadenopathy. BONES AND SOFT TISSUES: Since the previous study, the patient has developed moderate-to-severe compression deformity of L1, but appears to be chronic. The patient is status post vertebral augmentation of a compression deformity of T11, as before. No focal soft tissue abnormality. IMPRESSION: 1. Small bowel wall thickening involving the afferent loop proximal to the jejunal anastomosis and a dilated segment of small bowel in the left upper quadrant, measuring approximately 3.4 cm in diameter. 2. Stones and sludge within the gallbladder. 3. Moderate calcific coronary artery disease and moderate calcific plaque within the aortic arch and descending thoracic aorta. Electronically signed by: Evalene Coho MD 03/18/2024 05:00 AM EDT RP Workstation: [id number]   DG Chest Portable 1 View Result Date: 03/18/2024 EXAM: 1 VIEW(S) XRAY OF THE CHEST 03/18/2024 01:48:02 AM COMPARISON: None available. CLINICAL HISTORY: chest pain. Chest pain; Patient unable to tolerate pressure from xray cassette FINDINGS: LUNGS  AND PLEURA: Associated pleural thickening or small left pleural effusion. No focal pulmonary opacity. No pulmonary edema. No pneumothorax. HEART AND MEDIASTINUM: No acute abnormality of the cardiac and mediastinal silhouettes. BONES AND SOFT TISSUES: Chronic left rib fractures. IMPRESSION: 1. No acute cardiopulmonary process. 2. Chronic left fractures with associated small left pleural effusion or pleural thickening. Electronically signed by: Norman Gatlin MD 03/18/2024 01:52 AM EDT RP Workstation: [id number]     Procedures   Medications Ordered in the ED  cefTRIAXone  (ROCEPHIN ) 1 g in sodium chloride  0.9 % 100 mL IVPB (has no administration in time range)  prochlorperazine (COMPAZINE) injection 5 mg (has  no administration in time range)  0.9 %  sodium chloride  infusion ( Intravenous New Bag/Given 03/18/24 0601)  0.9 %  sodium chloride  infusion (0 mLs Intravenous Stopped 03/18/24 0600)  famotidine (PEPCID) IVPB 20 mg premix (0 mg Intravenous Stopped 03/18/24 0600)    Clinical Course as of 03/18/24 0602  Sat Mar 18, 2024  0256 Called CT to change studies to noncontrast due to patient's GFR [LM]    Clinical Course User Index [LM] Logan Ubaldo KATHEE DEVONNA                                 Medical Decision Making Amount and/or Complexity of Data Reviewed Labs: ordered. Radiology: ordered.  Risk Prescription drug management. Decision regarding hospitalization.   This patient presents to the ED for concern of epigastric abdominal pain, vomiting, weakness, this involves an extensive number of treatment options, and is a complaint that carries with it a high risk of complications and morbidity.  The differential diagnosis includes sepsis, pyelonephritis, gastritis, cholecystitis, pancreatitis, others   Co morbidities / Chronic conditions that complicate the patient evaluation  History of reflux treated previously with omeprazole , history of acute renal failure   Additional history  obtained:  Additional history obtained from EMR External records from outside source obtained and reviewed including primary care notes showing the patient's omeprazole  was discontinued due to concerns about acute interstitial nephritis due to omeprazole    Lab Tests:  I Ordered, and personally interpreted labs.  The pertinent results include:  Creatinine 2.35, baseline appears to be 1.5.  Leukocytosis with a white count of 14,100, UA with large leukocytes, many bacteria, 21-50 WBC   Imaging Studies ordered:  I ordered imaging studies including chest x-ray, CT chest abdomen pelvis without contrast due to GFR I independently visualized and interpreted imaging which showed  1. Small bowel wall thickening involving the afferent loop proximal to the  jejunal anastomosis and a dilated segment of small bowel in the left upper  quadrant, measuring approximately 3.4 cm in diameter.  2. Stones and sludge within the gallbladder.  3. Moderate calcific coronary artery disease and moderate calcific plaque  within the aortic arch and descending thoracic aorta.   I agree with the radiologist interpretation   Cardiac Monitoring: / EKG:  The patient was maintained on a cardiac monitor.  I personally viewed and interpreted the cardiac monitored which showed an underlying rhythm of: Sinus rhythm   Problem List / ED Course / Critical interventions / Medication management   I ordered medication including Pepcid, Rocephin , saline bolus Reevaluation of the patient after these medicines showed that the patient improved I have reviewed the patients home medicines and have made adjustments as needed   Consultations Obtained:  I requested consultation with the hospitalist, Dr.Opyd, and discussed lab and imaging findings as well as pertinent plan - they recommend: admission   Social Determinants of Health:  Patient is a daily smoker   Test / Admission - Considered:  Patient with evidence of AKI.  UA concerning for possible infection/possible UTI but patient isn't currently endorsing specific urinary symptoms.  Patient would benefit from admission for continued fluid resuscitation and further workup.  Unclear cause of patient's AKI.  Question dehydration with increased diuresis over the past month although urine specific gravity is grossly normal.      Final diagnoses:  AKI (acute kidney injury)  Acute cystitis without hematuria    ED Discharge Orders  None          Logan Ubaldo KATHEE DEVONNA 03/18/24 0602    Carita Senior, MD 03/19/24 [phone]

## 2024-03-18 NOTE — ED Notes (Signed)
 Dan[place], MD removed the pt from cardiac monitoring (confirmed with MD). CCMD notified to discontinue monitoring.

## 2024-03-18 NOTE — ED Notes (Signed)
 Patient transported to CT

## 2024-03-19 ENCOUNTER — Observation Stay (HOSPITAL_COMMUNITY)

## 2024-03-19 DIAGNOSIS — E039 Hypothyroidism, unspecified: Secondary | ICD-10-CM | POA: Diagnosis present

## 2024-03-19 DIAGNOSIS — K259 Gastric ulcer, unspecified as acute or chronic, without hemorrhage or perforation: Secondary | ICD-10-CM | POA: Diagnosis not present

## 2024-03-19 DIAGNOSIS — Z8249 Family history of ischemic heart disease and other diseases of the circulatory system: Secondary | ICD-10-CM | POA: Diagnosis not present

## 2024-03-19 DIAGNOSIS — Z96653 Presence of artificial knee joint, bilateral: Secondary | ICD-10-CM | POA: Diagnosis present

## 2024-03-19 DIAGNOSIS — I509 Heart failure, unspecified: Secondary | ICD-10-CM

## 2024-03-19 DIAGNOSIS — Z833 Family history of diabetes mellitus: Secondary | ICD-10-CM | POA: Diagnosis not present

## 2024-03-19 DIAGNOSIS — N1832 Chronic kidney disease, stage 3b: Secondary | ICD-10-CM | POA: Diagnosis not present

## 2024-03-19 DIAGNOSIS — N170 Acute kidney failure with tubular necrosis: Secondary | ICD-10-CM | POA: Diagnosis not present

## 2024-03-19 DIAGNOSIS — R1310 Dysphagia, unspecified: Secondary | ICD-10-CM | POA: Diagnosis present

## 2024-03-19 DIAGNOSIS — F32A Depression, unspecified: Secondary | ICD-10-CM | POA: Diagnosis present

## 2024-03-19 DIAGNOSIS — Z83719 Family history of colon polyps, unspecified: Secondary | ICD-10-CM | POA: Diagnosis not present

## 2024-03-19 DIAGNOSIS — E785 Hyperlipidemia, unspecified: Secondary | ICD-10-CM | POA: Diagnosis present

## 2024-03-19 DIAGNOSIS — Z882 Allergy status to sulfonamides status: Secondary | ICD-10-CM | POA: Diagnosis not present

## 2024-03-19 DIAGNOSIS — K8010 Calculus of gallbladder with chronic cholecystitis without obstruction: Secondary | ICD-10-CM | POA: Diagnosis present

## 2024-03-19 DIAGNOSIS — I10 Essential (primary) hypertension: Secondary | ICD-10-CM | POA: Diagnosis not present

## 2024-03-19 DIAGNOSIS — K8020 Calculus of gallbladder without cholecystitis without obstruction: Secondary | ICD-10-CM | POA: Diagnosis not present

## 2024-03-19 DIAGNOSIS — Z9884 Bariatric surgery status: Secondary | ICD-10-CM | POA: Diagnosis not present

## 2024-03-19 DIAGNOSIS — K9189 Other postprocedural complications and disorders of digestive system: Secondary | ICD-10-CM | POA: Diagnosis not present

## 2024-03-19 DIAGNOSIS — Z5320 Procedure and treatment not carried out because of patient's decision for unspecified reasons: Secondary | ICD-10-CM | POA: Diagnosis present

## 2024-03-19 DIAGNOSIS — Z8673 Personal history of transient ischemic attack (TIA), and cerebral infarction without residual deficits: Secondary | ICD-10-CM | POA: Diagnosis not present

## 2024-03-19 DIAGNOSIS — R109 Unspecified abdominal pain: Secondary | ICD-10-CM | POA: Diagnosis not present

## 2024-03-19 DIAGNOSIS — R1012 Left upper quadrant pain: Secondary | ICD-10-CM | POA: Diagnosis not present

## 2024-03-19 DIAGNOSIS — K289 Gastrojejunal ulcer, unspecified as acute or chronic, without hemorrhage or perforation: Secondary | ICD-10-CM | POA: Diagnosis not present

## 2024-03-19 DIAGNOSIS — I129 Hypertensive chronic kidney disease with stage 1 through stage 4 chronic kidney disease, or unspecified chronic kidney disease: Secondary | ICD-10-CM | POA: Diagnosis not present

## 2024-03-19 DIAGNOSIS — Z7984 Long term (current) use of oral hypoglycemic drugs: Secondary | ICD-10-CM | POA: Diagnosis not present

## 2024-03-19 DIAGNOSIS — F17210 Nicotine dependence, cigarettes, uncomplicated: Secondary | ICD-10-CM | POA: Diagnosis not present

## 2024-03-19 DIAGNOSIS — R933 Abnormal findings on diagnostic imaging of other parts of digestive tract: Secondary | ICD-10-CM | POA: Diagnosis not present

## 2024-03-19 DIAGNOSIS — N179 Acute kidney failure, unspecified: Secondary | ICD-10-CM | POA: Diagnosis not present

## 2024-03-19 DIAGNOSIS — E1122 Type 2 diabetes mellitus with diabetic chronic kidney disease: Secondary | ICD-10-CM | POA: Diagnosis present

## 2024-03-19 DIAGNOSIS — E669 Obesity, unspecified: Secondary | ICD-10-CM | POA: Diagnosis present

## 2024-03-19 DIAGNOSIS — E876 Hypokalemia: Secondary | ICD-10-CM | POA: Diagnosis present

## 2024-03-19 DIAGNOSIS — Z79890 Hormone replacement therapy (postmenopausal): Secondary | ICD-10-CM | POA: Diagnosis not present

## 2024-03-19 DIAGNOSIS — Z83438 Family history of other disorder of lipoprotein metabolism and other lipidemia: Secondary | ICD-10-CM | POA: Diagnosis not present

## 2024-03-19 LAB — CBC
HCT: 41.5 % (ref 36.0–46.0)
Hemoglobin: 13.9 g/dL (ref 12.0–15.0)
MCH: 33.4 pg (ref 26.0–34.0)
MCHC: 33.5 g/dL (ref 30.0–36.0)
MCV: 99.8 fL (ref 80.0–100.0)
Platelets: 252 K/uL (ref 150–400)
RBC: 4.16 MIL/uL (ref 3.87–5.11)
RDW: 13.1 % (ref 11.5–15.5)
WBC: 12.9 K/uL — ABNORMAL HIGH (ref 4.0–10.5)
nRBC: 0 % (ref 0.0–0.2)

## 2024-03-19 LAB — COMPREHENSIVE METABOLIC PANEL WITH GFR
ALT: 13 U/L (ref 0–44)
AST: 17 U/L (ref 15–41)
Albumin: 2.6 g/dL — ABNORMAL LOW (ref 3.5–5.0)
Alkaline Phosphatase: 27 U/L — ABNORMAL LOW (ref 38–126)
Anion gap: 15 (ref 5–15)
BUN: 47 mg/dL — ABNORMAL HIGH (ref 8–23)
CO2: 21 mmol/L — ABNORMAL LOW (ref 22–32)
Calcium: 8.3 mg/dL — ABNORMAL LOW (ref 8.9–10.3)
Chloride: 100 mmol/L (ref 98–111)
Creatinine, Ser: 1.53 mg/dL — ABNORMAL HIGH (ref 0.44–1.00)
GFR, Estimated: 35 mL/min — ABNORMAL LOW (ref >60)
Glucose, Bld: 256 mg/dL — ABNORMAL HIGH (ref 70–99)
Potassium: 3 mmol/L — ABNORMAL LOW (ref 3.5–5.1)
Sodium: 136 mmol/L (ref 135–145)
Total Bilirubin: 0.6 mg/dL (ref 0.0–1.2)
Total Protein: 5.7 g/dL — ABNORMAL LOW (ref 6.5–8.1)

## 2024-03-19 LAB — GLUCOSE, CAPILLARY
Glucose-Capillary: 237 mg/dL — ABNORMAL HIGH (ref 70–99)
Glucose-Capillary: 156 mg/dL — ABNORMAL HIGH (ref 70–99)
Glucose-Capillary: 184 mg/dL — ABNORMAL HIGH (ref 70–99)
Glucose-Capillary: 135 mg/dL — ABNORMAL HIGH (ref 70–99)

## 2024-03-19 LAB — ECHOCARDIOGRAM COMPLETE
Height: 62 in
S' Lateral: 2.9 cm
Weight: 2546.75 [oz_av]

## 2024-03-19 MED ORDER — POTASSIUM CHLORIDE 20 MEQ PO PACK
40.0000 meq | PACK | Freq: Once | ORAL | Status: AC
  Administered 2024-03-19: 40 meq via ORAL
  Filled 2024-03-19: qty 2

## 2024-03-19 MED ORDER — POTASSIUM CHLORIDE IN NACL 20-0.9 MEQ/L-% IV SOLN: INTRAVENOUS | Status: DC

## 2024-03-19 MED ORDER — POTASSIUM CHLORIDE IN NACL 20-0.9 MEQ/L-% IV SOLN
INTRAVENOUS | Status: DC
  Filled 2024-03-19 (×2): qty 1000

## 2024-03-19 MED ORDER — HYDRALAZINE HCL 25 MG PO TABS: 25.0000 mg | ORAL_TABLET | Freq: Three times a day (TID) | ORAL | Status: DC | PRN

## 2024-03-19 MED ORDER — PANTOPRAZOLE SODIUM 40 MG IV SOLR
40.0000 mg | Freq: Two times a day (BID) | INTRAVENOUS | Status: DC
  Administered 2024-03-19 – 2024-03-23 (×9): 40 mg via INTRAVENOUS
  Filled 2024-03-19 (×8): qty 10

## 2024-03-19 NOTE — Consult Note (Signed)
 WOC Nurse Consult Note: Reason for Consult: vertebral wound Wound type:full thickness wound Pressure Injury POA: NA Measurement: see nursing flow sheets Wound bed:100% clean Drainage (amount, consistency, odor) see nursing flow sheets Periwound:intact Dressing procedure/placement/frequency: Cleanse wound with saline, pat dry Apply hydrogel, top with dry dressing. Change daily. Turn and reposition per hospital policy   .  Re consult if needed, will not follow at this time. Thanks  Cayce Quezada [employer], RN,CWOCN, CNS, [employer] [phone]

## 2024-03-19 NOTE — Care Management Obs Status (Signed)
 MEDICARE OBSERVATION STATUS NOTIFICATION   Patient Details  Name: Sharon Lowe MRN: [id number] Date of Birth: 08-26-1945   Medicare Observation Status Notification Given:  No (Friend Mliss Sharps reports patient could possibly have surgery and wants to wait until tomorrow to discuss MOON letter)    Robynn Eileen Hoose, RN 03/19/2024, 3:32 PM

## 2024-03-19 NOTE — Progress Notes (Signed)
  Echocardiogram 2D Echocardiogram has been performed.  Sharon Lowe 03/19/2024, 3:44 PM

## 2024-03-19 NOTE — Progress Notes (Addendum)
 Assessment & Plan: 78 y.o. female with CKD, DM, hx of gastric bypass in 2012 by Dr. Adina Lunger, HTN, AKI on CKD presenting with several days of vomiting, epigastric pain, and generalized abdominal tenderness.   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  Requesting pain meds.  Objective: Vital signs in last 24 hours: Temp:  [97.5 F (36.4 C)-98.9 F (37.2 C)] 98.2 F (36.8 C) (10/19 0932) Pulse Rate:  [45-130] 77 (10/19 0932) Resp:  [13-19] 18 (10/19 0932) BP: (134-162)/(54-95) 162/95 (10/19 0932) SpO2:  [95 %-100 %] 99 % (10/19 0932) Last BM Date : 03/18/24  Intake/Output from previous day: 10/18 0701 - 10/19 0700 In: 2226.2 [P.O.:350; I.V.:1676.2; IV Piggyback:200.1] Out: -  Intake/Output this shift: No intake/output data recorded.  Physical Exam: HEENT - sclerae clear, mucous membranes moist Abdomen - mild distension; voluntary guarding; diffuse tenderness to palpation per patient  Lab Results:  Recent Labs    03/18/24 0152 03/19/24 0810  WBC 14.1* 12.9*  HGB 14.2 13.9  HCT 43.0 41.5  PLT 323 252   BMET Recent Labs    03/18/24 0152 03/19/24 0810  NA 135 136  K 3.7 3.0*  CL 91* 100  CO2 25 21*  GLUCOSE 149* 256*  BUN 66* 47*  CREATININE 2.35* 1.53*  CALCIUM  9.9 8.3*   PT/INR No results for  input(s): LABPROT, INR in the last 72 hours. Comprehensive Metabolic Panel:    Component Value Date/Time   NA 136 03/19/2024 0810   NA 135 03/18/2024 0152   K 3.0 (L) 03/19/2024 0810   K 3.7 03/18/2024 0152   CL 100 03/19/2024 0810   CL 91 (L) 03/18/2024 0152   CO2 21 (L) 03/19/2024 0810   CO2 25 03/18/2024 0152   BUN 47 (H) 03/19/2024 0810   BUN 66 (H) 03/18/2024 0152   CREATININE 1.53 (H) 03/19/2024 0810   CREATININE 2.35 (H) 03/18/2024 0152   GLUCOSE 256 (H) 03/19/2024 0810   GLUCOSE 149 (H) 03/18/2024 0152   CALCIUM  8.3 (L) 03/19/2024 0810   CALCIUM  9.9 03/18/2024 0152   AST 17 03/19/2024 0810   AST 25 03/18/2024 0152   ALT 13 03/19/2024 0810   ALT 17 03/18/2024 0152   ALKPHOS 27 (L) 03/19/2024 0810   ALKPHOS 38 03/18/2024 0152   BILITOT 0.6 03/19/2024 0810   BILITOT 0.9 03/18/2024 0152   PROT 5.7 (L) 03/19/2024 0810   PROT 7.0 03/18/2024 0152   ALBUMIN 2.6 (L) 03/19/2024 0810   ALBUMIN 3.5 03/18/2024 0152    Studies/Results: US  Abdomen Complete Result Date: 03/18/2024 EXAM: COMPLETE ABDOMINAL ULTRASOUND TECHNIQUE: Real-time ultrasonography of the abdomen was performed. COMPARISON: CT of the abdomen and pelvis dated 04/30/2014. CLINICAL HISTORY: Epigastric pain. FINDINGS: LIVER: The liver is heterogeneously echogenic. No intrahepatic biliary ductal dilatation. No  mass. BILIARY SYSTEM: There are multiple stones and sludge present within the gallbladder. The patient is also focally tender over the gallbladder. The gallbladder wall measures approximately 3.5 mm in thickness. Negative sonographic Murphy's sign. Common bile duct is within normal limits measuring 6 mm. KIDNEYS: Normal size and contour of kidneys. Normal cortical echogenicity. No hydronephrosis. No calculus. There is a simple exophytic cyst arising from the left kidney measuring approximately 2.6 cm in diameter. PANCREAS: Visualized portions of the pancreas are unremarkable. SPLEEN: No acute abnormality. Spleen  is within normal limits in size. VESSELS: There is calcification within the wall of the abdominal aorta. Visualized portion of the inferior vena cava is normal. OTHER: No ascites. IMPRESSION: 1. Multiple gallstones and sludge with gallbladder wall thickening (3.5 mm) and focal tenderness, suggesting possible cholecystitis. 2. Heterogeneous echogenicity of the liver. 3. Simple exophytic cyst in the left kidney measuring 2.6 cm. 4. Calcification within the wall of the abdominal aorta. Electronically signed by: Evalene Coho MD 03/18/2024 11:26 AM EDT RP Workstation: [id number]   CT CHEST ABDOMEN PELVIS WO CONTRAST Result Date: 03/18/2024 EXAM: CT CHEST, ABDOMEN AND PELVIS WITHOUT CONTRAST 03/18/2024 03:35:00 AM TECHNIQUE: CT of the chest, abdomen and pelvis was performed without the administration of intravenous contrast. Multiplanar reformatted images are provided for review. Automated exposure control, iterative reconstruction, and/or weight based adjustment of the mA/kV was utilized to reduce the radiation dose to as low as reasonably achievable. COMPARISON: CT of the chest dated 10/27/2022. CLINICAL HISTORY: RLQ abdominal pain. Patient had difficulty holding still and laying flat. Chief complaints: Chest Pain. FINDINGS: CHEST: MEDIASTINUM AND LYMPH NODES: Heart and pericardium are unremarkable. The central airways are clear. No mediastinal, hilar or axillary lymphadenopathy. There is moderate calcific coronary artery disease. There is moderate calcific plaque also present within the aortic arch and descending thoracic aorta. LUNGS AND PLEURA: Minimal atelectasis present dependently within the lungs. No pleural effusion or pneumothorax. ABDOMEN AND PELVIS: LIVER: The liver is unremarkable. GALLBLADDER AND BILE DUCTS: There are stones and sludge within the gallbladder. No biliary ductal dilatation. SPLEEN: No acute abnormality. PANCREAS: No acute abnormality. ADRENAL GLANDS: No acute abnormality. KIDNEYS,  URETERS AND BLADDER: There is a simple exophytic cyst arising from the lower pole of the left kidney, measuring approximately 3.5 cm in diameter. Per consensus, no follow-up is needed for simple Bosniak type 1 and 2 renal cysts, unless the patient has a malignancy history or risk factors. No stones in the kidneys or ureters. No hydronephrosis. No perinephric or periureteral stranding. Urinary bladder is unremarkable. GI AND BOWEL: The patient is status post gastric bypass surgery. There is a dilated segment of small bowel in the left upper quadrant, measuring approximately 3.4 cm in diameter. There is also small bowel wall thickening involving the afferent loop proximal to the jejunal anastomosis. Stomach demonstrates no acute abnormality. There is no bowel obstruction. REPRODUCTIVE ORGANS: The patient is status post hysterectomy and bilateral salpingo-oophorectomy. PERITONEUM AND RETROPERITONEUM: No ascites. No free air. VASCULATURE: Aorta is normal in caliber. ABDOMINAL AND PELVIS LYMPH NODES: No lymphadenopathy. BONES AND SOFT TISSUES: Since the previous study, the patient has developed moderate-to-severe compression deformity of L1, but appears to be chronic. The patient is status post vertebral augmentation of a compression deformity of T11, as before. No focal soft tissue abnormality. IMPRESSION: 1. Small bowel wall thickening involving the afferent loop proximal to the jejunal anastomosis and a dilated segment of small bowel in the left upper quadrant, measuring approximately 3.4 cm in diameter. 2. Stones  and sludge within the gallbladder. 3. Moderate calcific coronary artery disease and moderate calcific plaque within the aortic arch and descending thoracic aorta. Electronically signed by: Evalene Coho MD 03/18/2024 05:00 AM EDT RP Workstation: [id number]   DG Chest Portable 1 View Result Date: 03/18/2024 EXAM: 1 VIEW(S) XRAY OF THE CHEST 03/18/2024 01:48:02 AM COMPARISON: None available. CLINICAL  HISTORY: chest pain. Chest pain; Patient unable to tolerate pressure from xray cassette FINDINGS: LUNGS AND PLEURA: Associated pleural thickening or small left pleural effusion. No focal pulmonary opacity. No pulmonary edema. No pneumothorax. HEART AND MEDIASTINUM: No acute abnormality of the cardiac and mediastinal silhouettes. BONES AND SOFT TISSUES: Chronic left rib fractures. IMPRESSION: 1. No acute cardiopulmonary process. 2. Chronic left fractures with associated small left pleural effusion or pleural thickening. Electronically signed by: Norman Gatlin MD 03/18/2024 01:52 AM EDT RP Workstation: [id number]      Krystal Spinner 03/19/2024  Patient ID: Sharon Lowe, female   DOB: Oct 29, 1945, 78 y.o.   MRN: [id number]

## 2024-03-19 NOTE — Progress Notes (Addendum)
 Progress Note   Patient: Sharon Lowe FMW:[id number] DOB: [birth year] DOA: 03/18/2024     0 DOS: the patient was seen and examined on 03/19/2024 at 10:32AM      Brief hospital course: 78 y.o. F with obesity, hx gastric bypass, HTN, HLD, DM, CKD IIIb baseline 1.3-1.4 as recently as August, and recent treatment with Lasix for leg swelling who presents with upper abdominal pain, N/V, general weakness.     In the ER, Cr 2.35, WBC 14.1, and lipase 68. CT showed wall-thickening of afferent limb of gastric bypass, gallstones.   Admitted for AKI, abdominal pain.  Abdominal pain worsened, Gen Surg and GI consulted.      Assessment and Plan: AKI on CKDIIIb See summary from H&P.  UA with pyuria, but she has been off omeprazole  for a while. FENa low, and Cr improved with fluids.   -Hold Lasix, lisinopril , HCTZ, liraglutide , metformin  - Continue IV fluids     Abdominal pain Back pain Leukocytosis Patient is somewhat unable to localize or characterize her pain.  It seems that there is a central back pain which is constant, severe, and new since admission.  There is also an indigestion-like epigastric abdominal pain which preceded admission and was worse with food.  And on exam, she now appears to be obviously more tender in the left upper quadrant, although she is diffusely tender to palpation.   Thickening of the afferent limb of gastric bypass does not conjure any recognizable syndrome in my mind.   Gen Surg and GI are consulted, I am not sure their differential.   Initially thought this was pancreatitis given epigastric pain, indigestion, and elevated lipase.  Cholecystitis also considered given association with food, gallstones on imaging and thickening of gallbladder on ultrasound.  Doubt mesenteric ischemia.  No vomiting or diarrhea to suggest infection. - IV fluids - CLD - Continue Rocephin  and Flagyl - UGI series with SB follow through - HIDA - Consult GI and general surgery,  appreciate recommendations     Weight loss See admission H&P, seems this was water weight   Leg swelling Presumably this is related to her renal failure, but no recent echo in our system - Echo still pending   Sinus arrhythmia - Monitor on telemetry   Hypertension Blood pressure elevated due to pain - Hold HCTZ, Lasix, lisinopril  given renal failure - As needed hydralazine  for severe pressures   Hyperlipidemia -Hold Lipitor   Diabetes Glucose high normal -Continue sliding scale correction insulin  - Hold liraglutide  and metformin    Hypothyroidism -Continue levothyroxine   Hypokalemia - Supplement potassium       Subjective: Patient is having a lot of abdominal pain today.  General surgery saw her last night, recommended upper GI series and HIDA scan.  GI evaluated her today, recommend follow-up UGI S     Physical Exam: BP (!) 162/95 (BP Location: Left Arm)   Pulse 77   Temp 98.2 F (36.8 C) (Oral)   Resp 18   Ht 5' 2 (1.575 m)   Wt 72.2 kg   SpO2 99%   BMI 29.11 kg/m   Elderly adult female, lying in bed, interactive and appropriate RRR, no murmurs, no peripheral edema Respiratory rate normal, lungs clear without rales or wheezes, overall diminished due to pain Abdomen soft, no masses appreciated.  She has marked tenderness throughout, seems worse in the left upper quadrant.  She is guarding, she has no rigidity or rebound Attention normal, affect blunted by pain, appears quite uncomfortable, upper  extremity strength normal, face symmetric     Data Reviewed: Basic metabolic panel shows creatinine down to 1.5 CBC shows improved leukocytosis, no anemia Discussed with general surgery       Disposition: Status is: Observation However she requires ongoing IV opiates for pain not controlled with oral agents, ongoing IV antibiotics and continue work up for intractale abdominal pain        Author: Lonni SHAUNNA Dalton, MD 03/19/2024 1:01  PM  For on call review [email or web address].

## 2024-03-19 NOTE — Consult Note (Signed)
 [facility] Gastroenterology Consult  Referring Provider: No ref. provider found Primary Care Physician:  Dwight Trula SQUIBB, MD Primary Gastroenterologist: Dr. Timmothy GI  Reason for Consultation: Abdominal pain  HPI: Sharon Lowe is a 78 y.o. female who was admitted yesterday with abdominal pain.  Patient states that she has had frequent indigestion for which she takes omeprazole .  However, she had significant fluid buildup and edema and was prescribed diuretics and was recommended by her physician to stop omeprazole .  She came to the hospital because of abdominal pain, admission note states it was epigastric, however patient points towards lower and mid abdomen and states it is 10 out of 10, associated with 1 episode of nausea and vomiting without fever. Patient denies change in bowel habits.  Normally has a bowel movement every few days, denies hard stools but uses Dulcolax and MiraLAX  infrequently.  She denies seeing blood in stool or black stools. She has occasional difficulty swallowing both solids as well as liquids. She reports significant weight loss of 16 pounds when she started diuretics in a period of few weeks.   Previous GI workup: Colonoscopy 5/22: 1 tubular adenoma removed from cecum, multiple hyperplastic polyps removed from rectum and sigmoid, repeat recommended in 5 years Colonoscopy, 06/2018: 3 tubular adenomas and 1 sessile serrated adenoma removed with low-grade dysplasia Colonoscopy, 2017, Dr. Dyane, screening: 1, 12 mm cecal tubular adenoma removed Colonoscopy, 2007: Hyperplastic polyps were removed  Past Medical History:  Diagnosis Date   Arthritis    Bilateral ureteral calculi    Depression    mild   GERD (gastroesophageal reflux disease)    Heart murmur    asymptomatic   History of kidney stones 2015   History of sleep apnea    hx of prior to gastric bypass, no problems since   History of TIA (transient ischemic attack)    ~2000-- no residual   Hyperlipidemia     Hypertension    Hypothyroidism    Pneumonia    hx of as a child   Type 2 diabetes mellitus (HCC)    Urgency of urination    Wears contact lenses     Past Surgical History:  Procedure Laterality Date   CARPAL TUNNEL RELEASE Right    CATARACT EXTRACTION Bilateral 2015   CYSTOSCOPY W/ URETERAL STENT PLACEMENT Bilateral 12/05/2013   Procedure: CYSTOSCOPY WITH RETROGRADE PYELOGRAM/URETERAL STENT PLACEMENT;  Surgeon: Morene LELON Salines, MD;  Location: [facility];  Service: Urology;  Laterality: Bilateral;   CYSTOSCOPY WITH URETEROSCOPY AND STENT PLACEMENT Bilateral 12/20/2013   Procedure: CYSTOSCOPY WITH BILATERAL URETEROSCOPY, LASER LITHOTRIPSY AND STENT EXCHANGE;  Surgeon: Morene LELON Salines, MD;  Location: [facility];  Service: Urology;  Laterality: Bilateral;   EXPLORATOMY LAPAROTOMY FOR ABD. ABSCESS FROM MIGRATION OF IUD  1977   HOLMIUM LASER APPLICATION Bilateral 12/20/2013   Procedure: HOLMIUM LASER APPLICATION;  Surgeon: Morene LELON Salines, MD;  Location: [facility];  Service: Urology;  Laterality: Bilateral;   KNEE ARTHROSCOPY Right    LAPAROSCOPIC GASTRIC BYPASS  08-18-2010   AND REPAIR VENTRAL HERNIA   REVISION TOTAL KNEE ARTHROPLASTY Left 01-05-2005   SHOULDER OPEN ROTATOR CUFF REPAIR Left 07-12-2002   TONSILLECTOMY  CHILD   TOTAL ABDOMINAL HYSTERECTOMY W/ BILATERAL SALPINGOOPHORECTOMY  ~1995   TOTAL KNEE ARTHROPLASTY Left 11-17-2002   TOTAL KNEE ARTHROPLASTY Right 12/10/2014   Procedure: TOTAL KNEE ARTHROPLASTY;  Surgeon: Donnice Car, MD;  Location: [facility];  Service: Orthopedics;  Laterality: Right;   TOTAL KNEE REVISION WITH  SCAR DEBRIDEMENT/PATELLA REVISION WITH POLY EXCHANGE Left 10/30/2013   Procedure: LEFT  TOTAL  KNEE  REVISION;  Surgeon: Donnice JONETTA Car, MD;  Location: [facility];  Service: Orthopedics;  Laterality: Left;    Prior to Admission medications   Medication Sig Start Date End Date Taking? Authorizing Provider  acetaminophen  (TYLENOL )  500 MG tablet Take 1,000 mg by mouth every 8 (eight) hours as needed for mild pain (pain score 1-3) (or headaches).   Yes [provider]  atorvastatin  (LIPITOR) 10 MG tablet Take 10 mg by mouth at bedtime.   Yes [provider]  Calcium  Carbonate (CALCIUM  600 PO) Take 600 mg by mouth daily with breakfast.   Yes [provider]  Cyanocobalamin  (VITAMIN B-12) 2500 MCG SUBL Place 2,500 mcg under the tongue every morning.    Yes [provider]  docusate sodium  (COLACE) 100 MG capsule Take 1 capsule (100 mg total) by mouth 2 (two) times daily. Patient taking differently: Take 100 mg by mouth 2 (two) times daily as needed for mild constipation. 12/12/14  Yes Babish, Donnice, PA-C  ferrous sulfate  325 (65 FE) MG tablet Take 1 tablet (325 mg total) by mouth 3 (three) times daily after meals. Patient taking differently: Take 325 mg by mouth every Monday, Wednesday, and Friday. 12/12/14  Yes Babish, Donnice, PA-C  furosemide (LASIX) 40 MG tablet Take 40 mg by mouth in the morning.   Yes [provider]  hydrochlorothiazide  (HYDRODIURIL ) 25 MG tablet Take 25 mg by mouth daily.   Yes [provider]  levothyroxine  (SYNTHROID , LEVOTHROID) 88 MCG tablet Take 88 mcg by mouth daily before breakfast.  12/31/10  Yes [provider]  metFORMIN  (GLUCOPHAGE ) 500 MG tablet Take 500 mg by mouth in the morning and at bedtime.   Yes [provider]  metoprolol  succinate (TOPROL -XL) 25 MG 24 hr tablet Take 25 mg by mouth daily.   Yes [provider]  Multiple Vitamin (MULTIVITAMIN WITH MINERALS) TABS tablet Take 1-2 tablets by mouth daily with breakfast.   Yes [provider]  oxyCODONE -acetaminophen  (PERCOCET) 10-325 MG tablet Take 1 tablet by mouth 3 (three) times daily as needed for pain.   Yes [provider]  polyethylene glycol (MIRALAX  / GLYCOLAX ) packet Take 17 g by mouth 2 (two) times daily. Patient taking differently:  Take 17 g by mouth 2 (two) times daily as needed for mild constipation or moderate constipation. 12/12/14  Yes Babish, Donnice, PA-C  Pyridoxine HCl (VITAMIN B-6 PO) Take 1 tablet by mouth every morning.    Yes [provider]  telmisartan (MICARDIS) 80 MG tablet Take 80 mg by mouth daily.   Yes [provider]  TRULICITY 0.75 MG/0.5ML SOAJ Inject 0.75 mg into the skin every Saturday.   Yes [provider]  HYDROcodone -acetaminophen  (NORCO) 7.5-325 MG per tablet Take 1-2 tablets by mouth every 4 (four) hours as needed for moderate pain. Patient not taking: Reported on 03/18/2024 12/12/14   Danella Donnice, PA-C  tiZANidine  (ZANAFLEX ) 4 MG tablet Take 1 tablet (4 mg total) by mouth every 6 (six) hours as needed for muscle spasms. Patient not taking: Reported on 03/18/2024 12/12/14   Danella Donnice, PA-C    Current Facility-Administered Medications  Medication Dose Route Frequency Provider Last Rate Last Admin   0.9 %  sodium chloride  infusion   Intravenous Continuous Jonel Lonni SQUIBB, MD 100 mL/hr at 03/18/24 1254 New Bag at 03/18/24 1254   acetaminophen  (TYLENOL ) tablet 650 mg  650 mg Oral  Q6H PRN Danford, Lonni SQUIBB, MD       Or   acetaminophen  (TYLENOL ) suppository 650 mg  650 mg Rectal Q6H PRN Danford, Lonni SQUIBB, MD       cefTRIAXone  (ROCEPHIN ) 2 g in sodium chloride  0.9 % 100 mL IVPB  2 g Intravenous Q24H Jonel Lonni SQUIBB, MD 200 mL/hr at 03/19/24 0555 2 g at 03/19/24 0555   enoxaparin  (LOVENOX ) injection 30 mg  30 mg Subcutaneous Q24H Jonel Lonni SQUIBB, MD   30 mg at 03/19/24 0932   feeding supplement (BOOST / RESOURCE BREEZE) liquid 1 Container  1 Container Oral TID BM Jonel Lonni SQUIBB, MD   1 Container at 03/19/24 0945   HYDROmorphone  (DILAUDID ) injection 0.5 mg  0.5 mg Intravenous Q4H PRN Chavez, Abigail, NP   0.5 mg at 03/18/24 2209   Influenza vac split trivalent PF (FLUZONE  HIGH-DOSE) injection 0.5 mL  0.5 mL Intramuscular  Tomorrow-1000 Danford, Lonni SQUIBB, MD       insulin  aspart (novoLOG ) injection 0-15 Units  0-15 Units Subcutaneous TID WC Jonel Lonni SQUIBB, MD   5 Units at 03/19/24 0932   levothyroxine  (SYNTHROID ) tablet 88 mcg  88 mcg Oral Q0600 Merilee Linsey I, RPH   88 mcg at 03/19/24 9444   metoprolol  succinate (TOPROL -XL) 24 hr tablet 25 mg  25 mg Oral Daily Danford, Christopher P, MD   25 mg at 03/19/24 0932   metroNIDAZOLE (FLAGYL) IVPB 500 mg  500 mg Intravenous Q12H Ann Fine, MD 100 mL/hr at 03/19/24 0931 500 mg at 03/19/24 0931   oxyCODONE -acetaminophen  (PERCOCET/ROXICET) 5-325 MG per tablet 1 tablet  1 tablet Oral Q6H PRN Marten Larraine HERO, RPH       And   oxyCODONE  (Oxy IR/ROXICODONE ) immediate release tablet 5 mg  5 mg Oral Q6H PRN Marten Larraine HERO, RPH   5 mg at 03/19/24 9062   pantoprazole  (PROTONIX ) injection 40 mg  40 mg Intravenous Q12H Eletha Boas, MD       prochlorperazine (COMPAZINE) injection 5 mg  5 mg Intravenous Q6H PRN Opyd, Evalene RAMAN, MD        Allergies as of 03/18/2024 - Review Complete 03/18/2024  Allergen Reaction Noted   Empagliflozin Other (See Comments) 03/18/2024   Sulfa antibiotics Itching, Rash, and Dermatitis 01/07/2011    Family History  Problem Relation Age of Onset   Diabetes Mother    Hypertension Mother    Hyperlipidemia Mother    Heart disease Father    Prostate cancer Maternal Uncle    Cancer Paternal Grandmother        unaware of what kind   Colon polyps Maternal Uncle     Social History   Socioeconomic History   Marital status: Single    Spouse name: Not on file   Number of children: Not on file   Years of education: Not on file   Highest education level: Not on file  Occupational History   Not on file  Tobacco Use   Smoking status: Every Day    Current packs/day: 0.75    Average packs/day: 0.8 packs/day for 30.0 years (22.5 ttl pk-yrs)    Types: Cigarettes   Smokeless tobacco: Never  Substance and Sexual Activity    Alcohol use: Yes    Comment: very rare   Drug use: No   Sexual activity: Not on file  Other Topics Concern   Not on file  Social History Narrative   Not on file   Social Drivers of Health  Financial Resource Strain: Not on file  Food Insecurity: No Food Insecurity (03/18/2024)   Hunger Vital Sign    Worried About Running Out of Food in the Last Year: Never true    Ran Out of Food in the Last Year: Never true  Transportation Needs: No Transportation Needs (03/18/2024)   PRAPARE - Administrator, Civil Service (Medical): No    Lack of Transportation (Non-Medical): No  Physical Activity: Not on file  Stress: Not on file  Social Connections: Socially Isolated (03/18/2024)   Social Connection and Isolation Panel    Frequency of Communication with Friends and Family: Once a week    Frequency of Social Gatherings with Friends and Family: Once a week    Attends Religious Services: Never    Diplomatic Services operational officer: No    Attends Banker Meetings: Never    Marital Status: Divorced  Catering manager Violence: Not At Risk (03/18/2024)   Humiliation, Afraid, Rape, and Kick questionnaire    Fear of Current or Ex-Partner: No    Emotionally Abused: No    Physically Abused: No    Sexually Abused: No    Review of Systems: As per HPI  Physical Exam: Vital signs in last 24 hours: Temp:  [97.5 F (36.4 C)-98.9 F (37.2 C)] 98.2 F (36.8 C) (10/19 0932) Pulse Rate:  [45-108] 77 (10/19 0932) Resp:  [13-19] 18 (10/19 0932) BP: (134-162)/(54-95) 162/95 (10/19 0932) SpO2:  [95 %-100 %] 99 % (10/19 0932) Last BM Date : 03/18/24  General:   Alert,  Well-developed, well-nourished, pleasant and cooperative in NAD Head:  Normocephalic and atraumatic. Eyes:  Sclera clear, no icterus.   Conjunctiva pink. Ears:  Normal auditory acuity. Nose:  No deformity, discharge,  or lesions. Mouth:  No deformity or lesions.  Oropharynx pink & moist. Neck:  Supple;  no masses or thyromegaly. Lungs:  Clear throughout to auscultation.   No wheezes, crackles, or rhonchi. No acute distress. Heart:  Regular rate and rhythm; no murmurs, clicks, rubs,  or gallops. Extremities:  Without clubbing or edema. Neurologic:  Alert and  oriented x4;  grossly normal neurologically. Skin:  Intact without significant lesions or rashes. Psych:  Alert and cooperative. Normal mood and affect. Abdomen: Diffuse lower and mid abdominal tenderness, bowel sounds normal active         Lab Results: Recent Labs    03/18/24 0152 03/19/24 0810  WBC 14.1* 12.9*  HGB 14.2 13.9  HCT 43.0 41.5  PLT 323 252   BMET Recent Labs    03/18/24 0152 03/19/24 0810  NA 135 136  K 3.7 3.0*  CL 91* 100  CO2 25 21*  GLUCOSE 149* 256*  BUN 66* 47*  CREATININE 2.35* 1.53*  CALCIUM  9.9 8.3*   LFT Recent Labs    03/19/24 0810  PROT 5.7*  ALBUMIN 2.6*  AST 17  ALT 13  ALKPHOS 27*  BILITOT 0.6   PT/INR No results for input(s): LABPROT, INR in the last 72 hours.  Studies/Results: US  Abdomen Complete Result Date: 03/18/2024 EXAM: COMPLETE ABDOMINAL ULTRASOUND TECHNIQUE: Real-time ultrasonography of the abdomen was performed. COMPARISON: CT of the abdomen and pelvis dated 04/30/2014. CLINICAL HISTORY: Epigastric pain. FINDINGS: LIVER: The liver is heterogeneously echogenic. No intrahepatic biliary ductal dilatation. No mass. BILIARY SYSTEM: There are multiple stones and sludge present within the gallbladder. The patient is also focally tender over the gallbladder. The gallbladder wall measures approximately 3.5 mm in thickness. Negative sonographic Murphy's  sign. Common bile duct is within normal limits measuring 6 mm. KIDNEYS: Normal size and contour of kidneys. Normal cortical echogenicity. No hydronephrosis. No calculus. There is a simple exophytic cyst arising from the left kidney measuring approximately 2.6 cm in diameter. PANCREAS: Visualized portions of the pancreas are  unremarkable. SPLEEN: No acute abnormality. Spleen is within normal limits in size. VESSELS: There is calcification within the wall of the abdominal aorta. Visualized portion of the inferior vena cava is normal. OTHER: No ascites. IMPRESSION: 1. Multiple gallstones and sludge with gallbladder wall thickening (3.5 mm) and focal tenderness, suggesting possible cholecystitis. 2. Heterogeneous echogenicity of the liver. 3. Simple exophytic cyst in the left kidney measuring 2.6 cm. 4. Calcification within the wall of the abdominal aorta. Electronically signed by: Evalene Coho MD 03/18/2024 11:26 AM EDT RP Workstation: [id number]   CT CHEST ABDOMEN PELVIS WO CONTRAST Result Date: 03/18/2024 EXAM: CT CHEST, ABDOMEN AND PELVIS WITHOUT CONTRAST 03/18/2024 03:35:00 AM TECHNIQUE: CT of the chest, abdomen and pelvis was performed without the administration of intravenous contrast. Multiplanar reformatted images are provided for review. Automated exposure control, iterative reconstruction, and/or weight based adjustment of the mA/kV was utilized to reduce the radiation dose to as low as reasonably achievable. COMPARISON: CT of the chest dated 10/27/2022. CLINICAL HISTORY: RLQ abdominal pain. Patient had difficulty holding still and laying flat. Chief complaints: Chest Pain. FINDINGS: CHEST: MEDIASTINUM AND LYMPH NODES: Heart and pericardium are unremarkable. The central airways are clear. No mediastinal, hilar or axillary lymphadenopathy. There is moderate calcific coronary artery disease. There is moderate calcific plaque also present within the aortic arch and descending thoracic aorta. LUNGS AND PLEURA: Minimal atelectasis present dependently within the lungs. No pleural effusion or pneumothorax. ABDOMEN AND PELVIS: LIVER: The liver is unremarkable. GALLBLADDER AND BILE DUCTS: There are stones and sludge within the gallbladder. No biliary ductal dilatation. SPLEEN: No acute abnormality. PANCREAS: No acute abnormality.  ADRENAL GLANDS: No acute abnormality. KIDNEYS, URETERS AND BLADDER: There is a simple exophytic cyst arising from the lower pole of the left kidney, measuring approximately 3.5 cm in diameter. Per consensus, no follow-up is needed for simple Bosniak type 1 and 2 renal cysts, unless the patient has a malignancy history or risk factors. No stones in the kidneys or ureters. No hydronephrosis. No perinephric or periureteral stranding. Urinary bladder is unremarkable. GI AND BOWEL: The patient is status post gastric bypass surgery. There is a dilated segment of small bowel in the left upper quadrant, measuring approximately 3.4 cm in diameter. There is also small bowel wall thickening involving the afferent loop proximal to the jejunal anastomosis. Stomach demonstrates no acute abnormality. There is no bowel obstruction. REPRODUCTIVE ORGANS: The patient is status post hysterectomy and bilateral salpingo-oophorectomy. PERITONEUM AND RETROPERITONEUM: No ascites. No free air. VASCULATURE: Aorta is normal in caliber. ABDOMINAL AND PELVIS LYMPH NODES: No lymphadenopathy. BONES AND SOFT TISSUES: Since the previous study, the patient has developed moderate-to-severe compression deformity of L1, but appears to be chronic. The patient is status post vertebral augmentation of a compression deformity of T11, as before. No focal soft tissue abnormality. IMPRESSION: 1. Small bowel wall thickening involving the afferent loop proximal to the jejunal anastomosis and a dilated segment of small bowel in the left upper quadrant, measuring approximately 3.4 cm in diameter. 2. Stones and sludge within the gallbladder. 3. Moderate calcific coronary artery disease and moderate calcific plaque within the aortic arch and descending thoracic aorta. Electronically signed by: Evalene Coho MD 03/18/2024 05:00 AM EDT RP  Workstation: [id number]   DG Chest Portable 1 View Result Date: 03/18/2024 EXAM: 1 VIEW(S) XRAY OF THE CHEST 03/18/2024  01:48:02 AM COMPARISON: None available. CLINICAL HISTORY: chest pain. Chest pain; Patient unable to tolerate pressure from xray cassette FINDINGS: LUNGS AND PLEURA: Associated pleural thickening or small left pleural effusion. No focal pulmonary opacity. No pulmonary edema. No pneumothorax. HEART AND MEDIASTINUM: No acute abnormality of the cardiac and mediastinal silhouettes. BONES AND SOFT TISSUES: Chronic left rib fractures. IMPRESSION: 1. No acute cardiopulmonary process. 2. Chronic left fractures with associated small left pleural effusion or pleural thickening. Electronically signed by: Norman Gatlin MD 03/18/2024 01:52 AM EDT RP Workstation: [id number]    Impression: Abdominal pain, nausea, vomiting  CT chest, abdomen and pelvis without contrast: Status post gastric bypass surgery, dilated segment of small bowel in left upper quadrant measuring approximately 3.4 cm, small bowel wall thickening involving afferent loop proximal to jejunal anastomosis  Ultrasound: Multiple gallstones and sludge with gallbladder thickening 3.5 mm, possible cholecystitis, calcification within wall of abdominal aorta, normal CBD 6 mm  Plan: Surgical team on board, they have ordered upper GI series to better evaluate thickening of efferent loop noted on CAT scan.  Will follow results of upper GI series.  She does have gallstones but send symptoms are not typical of biliary colic, recommend considering HIDA scan for further evaluation.  Continue clear liquid for now if tolerated. Continue empiric antibiotics, currently on IV ceftriaxone  and IV Flagyl. Continue pantoprazole  40 mg every 12 hours. Continue Compazine 5 mg IV every 6 hours as needed nausea vomiting. Dr. Kriss will follow in a.m..  LOS: 0 days   Estelita Manas, MD  03/19/2024, 10:54 AM

## 2024-03-20 ENCOUNTER — Inpatient Hospital Stay (HOSPITAL_COMMUNITY)

## 2024-03-20 DIAGNOSIS — N1832 Chronic kidney disease, stage 3b: Secondary | ICD-10-CM | POA: Diagnosis not present

## 2024-03-20 DIAGNOSIS — N179 Acute kidney failure, unspecified: Secondary | ICD-10-CM | POA: Diagnosis not present

## 2024-03-20 LAB — BASIC METABOLIC PANEL WITH GFR
Anion gap: 13 (ref 5–15)
BUN: 37 mg/dL — ABNORMAL HIGH (ref 8–23)
CO2: 20 mmol/L — ABNORMAL LOW (ref 22–32)
Calcium: 7.8 mg/dL — ABNORMAL LOW (ref 8.9–10.3)
Chloride: 106 mmol/L (ref 98–111)
Creatinine, Ser: 1.27 mg/dL — ABNORMAL HIGH (ref 0.44–1.00)
GFR, Estimated: 43 mL/min — ABNORMAL LOW (ref >60)
Glucose, Bld: 156 mg/dL — ABNORMAL HIGH (ref 70–99)
Potassium: 3.6 mmol/L (ref 3.5–5.1)
Sodium: 139 mmol/L (ref 135–145)

## 2024-03-20 LAB — CBC
HCT: 36.5 % (ref 36.0–46.0)
Hemoglobin: 12.2 g/dL (ref 12.0–15.0)
MCH: 33.4 pg (ref 26.0–34.0)
MCHC: 33.4 g/dL (ref 30.0–36.0)
MCV: 100 fL (ref 80.0–100.0)
Platelets: 260 K/uL (ref 150–400)
RBC: 3.65 MIL/uL — ABNORMAL LOW (ref 3.87–5.11)
RDW: 13.4 % (ref 11.5–15.5)
WBC: 13.1 K/uL — ABNORMAL HIGH (ref 4.0–10.5)
nRBC: 0 % (ref 0.0–0.2)

## 2024-03-20 LAB — GLUCOSE, CAPILLARY
Glucose-Capillary: 160 mg/dL — ABNORMAL HIGH (ref 70–99)
Glucose-Capillary: 97 mg/dL (ref 70–99)
Glucose-Capillary: 145 mg/dL — ABNORMAL HIGH (ref 70–99)
Glucose-Capillary: 136 mg/dL — ABNORMAL HIGH (ref 70–99)

## 2024-03-20 MED ORDER — HYDROMORPHONE HCL 1 MG/ML IJ SOLN
0.5000 mg | Freq: Once | INTRAMUSCULAR | Status: AC
  Administered 2024-03-20: 0.5 mg via INTRAVENOUS
  Filled 2024-03-20: qty 0.5

## 2024-03-20 MED ORDER — METHOCARBAMOL 750 MG PO TABS
750.0000 mg | ORAL_TABLET | Freq: Once | ORAL | Status: AC
  Administered 2024-03-20: 750 mg via ORAL
  Filled 2024-03-20: qty 1

## 2024-03-20 MED ORDER — ENOXAPARIN SODIUM 40 MG/0.4ML IJ SOSY
40.0000 mg | PREFILLED_SYRINGE | INTRAMUSCULAR | Status: DC
  Administered 2024-03-21 – 2024-03-23 (×3): 40 mg via SUBCUTANEOUS
  Filled 2024-03-20 (×3): qty 0.4

## 2024-03-20 NOTE — Progress Notes (Signed)
 Mobility Specialist Progress Note:    03/20/24 1206  Mobility  Activity Ambulated with assistance (In hallway)  Level of Assistance Contact guard assist, steadying assist  Assistive Device Front wheel walker  Distance Ambulated (ft) 100 ft  Activity Response Tolerated well  Mobility Referral Yes  Mobility visit 1 Mobility  Mobility Specialist Start Time (ACUTE ONLY) 1128  Mobility Specialist Stop Time (ACUTE ONLY) 1146  Mobility Specialist Time Calculation (min) (ACUTE ONLY) 18 min   Received pt in bed and hesitant but agreeable to mobility. Pt required MinG for safety. Pt c/o back pain, otherwise tolerated well. Returned to room without fault. Left pt in bed with alarm on. Personal belongings and call light within reach. All needs met.  Lavanda Pollack Mobility Specialist  Please contact via [employer] or  Rehab Office [phone]

## 2024-03-20 NOTE — Plan of Care (Signed)
  Problem: Skin Integrity: Goal: Risk for impaired skin integrity will decrease Outcome: Progressing   Problem: Clinical Measurements: Goal: Will remain free from infection Outcome: Progressing

## 2024-03-20 NOTE — Plan of Care (Signed)
  Problem: Pain Managment: Goal: General experience of comfort will improve and/or be controlled Outcome: Progressing   Problem: Safety: Goal: Ability to remain free from injury will improve Outcome: Progressing

## 2024-03-20 NOTE — Progress Notes (Signed)
 Progress Note     Subjective: Patient states that her abdominal pain has improved. She reports a general soreness. She is unsure when her last bowel movement was. Denies flatulence. Was tolerating CLD without nausea and vomiting.   ROS  All negative with the exception of above.  Objective: Vital signs in last 24 hours: Temp:  [98 F (36.7 C)-98.8 F (37.1 C)] 98 F (36.7 C) (10/20 0437) Pulse Rate:  [77-97] 79 (10/20 0437) Resp:  [14-18] 17 (10/20 0437) BP: (105-162)/(50-95) 118/53 (10/20 0437) SpO2:  [96 %-99 %] 96 % (10/20 0437) Last BM Date : 03/18/24  Intake/Output from previous day: 10/19 0701 - 10/20 0700 In: 1236.5 [P.O.:100; I.V.:836.5; IV Piggyback:300] Out: -  Intake/Output this shift: No intake/output data recorded.  PE: General: Pleasant female who is laying in bed in NAD. HEENT: Head is normocephalic, atraumatic. Heart: HR normal. Lungs: Respiratory effort nonlabored. Abd: Soft with mild distention. Generalized tenderness to palpation. Some guarding. +BS. Skin: Warm and dry Psych: A&Ox3 with an appropriate affect.    Lab Results:  Recent Labs    03/18/24 0152 03/19/24 0810  WBC 14.1* 12.9*  HGB 14.2 13.9  HCT 43.0 41.5  PLT 323 252   BMET Recent Labs    03/18/24 0152 03/19/24 0810  NA 135 136  K 3.7 3.0*  CL 91* 100  CO2 25 21*  GLUCOSE 149* 256*  BUN 66* 47*  CREATININE 2.35* 1.53*  CALCIUM  9.9 8.3*   PT/INR No results for input(s): LABPROT, INR in the last 72 hours. CMP     Component Value Date/Time   NA 136 03/19/2024 0810   K 3.0 (L) 03/19/2024 0810   CL 100 03/19/2024 0810   CO2 21 (L) 03/19/2024 0810   GLUCOSE 256 (H) 03/19/2024 0810   BUN 47 (H) 03/19/2024 0810   CREATININE 1.53 (H) 03/19/2024 0810   CALCIUM  8.3 (L) 03/19/2024 0810   PROT 5.7 (L) 03/19/2024 0810   ALBUMIN 2.6 (L) 03/19/2024 0810   AST 17 03/19/2024 0810   ALT 13 03/19/2024 0810   ALKPHOS 27 (L) 03/19/2024 0810   BILITOT 0.6 03/19/2024 0810    GFRNONAA 35 (L) 03/19/2024 0810   GFRAA 58 (L) 12/12/2014 0425   Lipase     Component Value Date/Time   LIPASE 68 (H) 03/18/2024 0152       Studies/Results: ECHOCARDIOGRAM COMPLETE Result Date: 03/19/2024    ECHOCARDIOGRAM REPORT   Patient Name:   Sharon Lowe Date of Exam: 03/19/2024 Medical Rec #:  [id number]     Height:       62.0 in Accession #:    [id number]    Weight:       159.2 lb Date of Birth:  [birth year]     BSA:          1.735 m Patient Age:    78 years      BP:           162/95 mmHg Patient Gender: F             HR:           68 bpm. Exam Location:  Inpatient Procedure: 2D Echo (Both Spectral and Color Flow Doppler were utilized during            procedure). Indications:    congestive heart failure  History:        Patient has prior history of Echocardiogram examinations, most  recent 04/02/2014. Chronic kidney disease; Risk Factors:Diabetes,                 Hypertension and Current Smoker.  Sonographer:    Tinnie Barefoot RDCS Referring Phys: [id number] CHRISTOPHER P DANFORD  Sonographer Comments: Suboptimal subcostal window and patient is obese. Image acquisition challenging due to respiratory motion, patient in pain with minimal pressure. and Image acquisition challenging due to patient body habitus. IMPRESSIONS  1. Left ventricular ejection fraction, by estimation, is 60 to 65%. The left ventricle has normal function. The left ventricle has no regional wall motion abnormalities. Left ventricular diastolic parameters are consistent with Grade I diastolic dysfunction (impaired relaxation).  2. Right ventricular systolic function is moderately reduced. The right ventricular size is moderately enlarged.  3. Right atrial size was mildly dilated.  4. The mitral valve is abnormal. No evidence of mitral valve regurgitation. No evidence of mitral stenosis.  5. The aortic valve is normal in structure. Aortic valve regurgitation is not visualized. No aortic stenosis is present.  6.  The inferior vena cava is normal in size with greater than 50% respiratory variability, suggesting right atrial pressure of 3 mmHg. FINDINGS  Left Ventricle: Left ventricular ejection fraction, by estimation, is 60 to 65%. The left ventricle has normal function. The left ventricle has no regional wall motion abnormalities. Strain was performed and the global longitudinal strain is indeterminate. The left ventricular internal cavity size was normal in size. There is no left ventricular hypertrophy. Left ventricular diastolic parameters are consistent with Grade I diastolic dysfunction (impaired relaxation). Right Ventricle: The right ventricular size is moderately enlarged. No increase in right ventricular wall thickness. Right ventricular systolic function is moderately reduced. Left Atrium: Left atrial size was normal in size. Right Atrium: Right atrial size was mildly dilated. Pericardium: There is no evidence of pericardial effusion. Mitral Valve: The mitral valve is abnormal. There is mild thickening of the mitral valve leaflet(s). There is mild calcification of the mitral valve leaflet(s). Mild mitral annular calcification. No evidence of mitral valve regurgitation. No evidence of mitral valve stenosis. Tricuspid Valve: The tricuspid valve is normal in structure. Tricuspid valve regurgitation is not demonstrated. No evidence of tricuspid stenosis. Aortic Valve: The aortic valve is normal in structure. Aortic valve regurgitation is not visualized. No aortic stenosis is present. Pulmonic Valve: The pulmonic valve was normal in structure. Pulmonic valve regurgitation is not visualized. No evidence of pulmonic stenosis. Aorta: The aortic root is normal in size and structure. Venous: The inferior vena cava is normal in size with greater than 50% respiratory variability, suggesting right atrial pressure of 3 mmHg. IAS/Shunts: The interatrial septum was not well visualized. Additional Comments: 3D was performed not  requiring image post processing on an independent workstation and was indeterminate.  LEFT VENTRICLE PLAX 2D LVIDd:         4.00 cm LVIDs:         2.90 cm LVOT diam:     2.20 cm LV SV:         82 LV SV Index:   47 LVOT Area:     3.80 cm  RIGHT VENTRICLE RV Basal diam:  2.40 cm LEFT ATRIUM           Index        RIGHT ATRIUM           Index LA diam:      3.50 cm 2.02 cm/m   RA Area:     12.90 cm LA Vol (  A4C): 63.8 ml 36.77 ml/m  RA Volume:   27.40 ml  15.79 ml/m  AORTIC VALVE LVOT Vmax:   127.33 cm/s LVOT Vmean:  77.133 cm/s LVOT VTI:    0.216 m  AORTA Ao Root diam: 3.30 cm Ao Asc diam:  3.10 cm  SHUNTS Systemic VTI:  0.22 m Systemic Diam: 2.20 cm Maude Emmer MD Electronically signed by Maude Emmer MD Signature Date/Time: 03/19/2024/4:03:49 PM    Final    US  Abdomen Complete Result Date: 03/18/2024 EXAM: COMPLETE ABDOMINAL ULTRASOUND TECHNIQUE: Real-time ultrasonography of the abdomen was performed. COMPARISON: CT of the abdomen and pelvis dated 04/30/2014. CLINICAL HISTORY: Epigastric pain. FINDINGS: LIVER: The liver is heterogeneously echogenic. No intrahepatic biliary ductal dilatation. No mass. BILIARY SYSTEM: There are multiple stones and sludge present within the gallbladder. The patient is also focally tender over the gallbladder. The gallbladder wall measures approximately 3.5 mm in thickness. Negative sonographic Murphy's sign. Common bile duct is within normal limits measuring 6 mm. KIDNEYS: Normal size and contour of kidneys. Normal cortical echogenicity. No hydronephrosis. No calculus. There is a simple exophytic cyst arising from the left kidney measuring approximately 2.6 cm in diameter. PANCREAS: Visualized portions of the pancreas are unremarkable. SPLEEN: No acute abnormality. Spleen is within normal limits in size. VESSELS: There is calcification within the wall of the abdominal aorta. Visualized portion of the inferior vena cava is normal. OTHER: No ascites. IMPRESSION: 1. Multiple  gallstones and sludge with gallbladder wall thickening (3.5 mm) and focal tenderness, suggesting possible cholecystitis. 2. Heterogeneous echogenicity of the liver. 3. Simple exophytic cyst in the left kidney measuring 2.6 cm. 4. Calcification within the wall of the abdominal aorta. Electronically signed by: Evalene Coho MD 03/18/2024 11:26 AM EDT RP Workstation: [id number]    Anti-infectives: Anti-infectives (From admission, onward)    Start     Dose/Rate Route Frequency Ordered Stop   03/19/24 0600  cefTRIAXone  (ROCEPHIN ) 2 g in sodium chloride  0.9 % 100 mL IVPB        2 g 200 mL/hr over 30 Minutes Intravenous Every 24 hours 03/18/24 1359     03/18/24 2230  metroNIDAZOLE (FLAGYL) IVPB 500 mg        500 mg 100 mL/hr over 60 Minutes Intravenous Every 12 hours 03/18/24 2139     03/18/24 1500  cefTRIAXone  (ROCEPHIN ) 1 g in sodium chloride  0.9 % 100 mL IVPB        1 g 200 mL/hr over 30 Minutes Intravenous  Once 03/18/24 1412 03/18/24 1506   03/18/24 0530  cefTRIAXone  (ROCEPHIN ) 1 g in sodium chloride  0.9 % 100 mL IVPB        1 g 200 mL/hr over 30 Minutes Intravenous  Once 03/18/24 0524 03/18/24 0753        Assessment/Plan 78 y.o. female with CKD, DM, hx of gastric bypass in 2012 by Dr. Adina Lunger, HTN, AKI on CKD presenting with several days of vomiting, epigastric pain, and generalized abdominal tenderness.   -Afebrile -WBC from 10/19 12.9. Labs pending today.   Afferent limb dilation/thickening  - Continue CLD - Continue IV abx - Continue PPI - GI has been consulted and is following. 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  This care required moderate level of medical decision making.    Marjorie Carlyon Favre, [facility] 03/20/2024, 8:10 AM Please see Amion for pager number during day hours 7:00am-4:30pm

## 2024-03-20 NOTE — Progress Notes (Signed)
 [facility] Progress Note  SUBJECTIVE:   Interval history: Sharon Lowe was seen and evaluated today at bedside. Resting in bed. Noted having abdominal discomfort. No chest pain or shortness of breath. Pending results of abdominal imaging.   Past Medical History:  Diagnosis Date   Arthritis    Bilateral ureteral calculi    Depression    mild   GERD (gastroesophageal reflux disease)    Heart murmur    asymptomatic   History of kidney stones 2015   History of sleep apnea    hx of prior to gastric bypass, no problems since   History of TIA (transient ischemic attack)    ~2000-- no residual   Hyperlipidemia    Hypertension    Hypothyroidism    Pneumonia    hx of as a child   Type 2 diabetes mellitus (HCC)    Urgency of urination    Wears contact lenses    Past Surgical History:  Procedure Laterality Date   CARPAL TUNNEL RELEASE Right    CATARACT EXTRACTION Bilateral 2015   CYSTOSCOPY W/ URETERAL STENT PLACEMENT Bilateral 12/05/2013   Procedure: CYSTOSCOPY WITH RETROGRADE PYELOGRAM/URETERAL STENT PLACEMENT;  Surgeon: Morene LELON Salines, MD;  Location: [facility];  Service: Urology;  Laterality: Bilateral;   CYSTOSCOPY WITH URETEROSCOPY AND STENT PLACEMENT Bilateral 12/20/2013   Procedure: CYSTOSCOPY WITH BILATERAL URETEROSCOPY, LASER LITHOTRIPSY AND STENT EXCHANGE;  Surgeon: Morene LELON Salines, MD;  Location: [facility];  Service: Urology;  Laterality: Bilateral;   EXPLORATOMY LAPAROTOMY FOR ABD. ABSCESS FROM MIGRATION OF IUD  1977   HOLMIUM LASER APPLICATION Bilateral 12/20/2013   Procedure: HOLMIUM LASER APPLICATION;  Surgeon: Morene LELON Salines, MD;  Location[facility];  Service: Urology;  Laterality: Bilateral;   KNEE ARTHROSCOPY Right    LAPAROSCOPIC GASTRIC BYPASS  08-18-2010   AND REPAIR VENTRAL HERNIA   REVISION TOTAL KNEE ARTHROPLASTY Left 01-05-2005   SHOULDER OPEN ROTATOR CUFF REPAIR Left 07-12-2002   TONSILLECTOMY  CHILD    TOTAL ABDOMINAL HYSTERECTOMY W/ BILATERAL SALPINGOOPHORECTOMY  ~1995   TOTAL KNEE ARTHROPLASTY Left 11-17-2002   TOTAL KNEE ARTHROPLASTY Right 12/10/2014   Procedure: TOTAL KNEE ARTHROPLASTY;  Surgeon: Donnice Car, MD;  Location: [facility];  Service: Orthopedics;  Laterality: Right;   TOTAL KNEE REVISION WITH SCAR DEBRIDEMENT/PATELLA REVISION WITH POLY EXCHANGE Left 10/30/2013   Procedure: LEFT  TOTAL  KNEE  REVISION;  Surgeon: Donnice JONETTA Car, MD;  Location: [facility];  Service: Orthopedics;  Laterality: Left;   Current Facility-Administered Medications  Medication Dose Route Frequency Provider Last Rate Last Admin   0.9 % NaCl with KCl 20 mEq/ L  infusion   Intravenous Continuous Jonel Lonni SQUIBB, MD 75 mL/hr at 03/20/24 0535 New Bag at 03/20/24 0535   acetaminophen  (TYLENOL ) tablet 650 mg  650 mg Oral Q6H PRN Danford, Lonni SQUIBB, MD       Or   acetaminophen  (TYLENOL ) suppository 650 mg  650 mg Rectal Q6H PRN Danford, Lonni SQUIBB, MD       cefTRIAXone  (ROCEPHIN ) 2 g in sodium chloride  0.9 % 100 mL IVPB  2 g Intravenous Q24H Jonel Lonni SQUIBB, MD 200 mL/hr at 03/20/24 0538 2 g at 03/20/24 9461   enoxaparin  (LOVENOX ) injection 30 mg  30 mg Subcutaneous Q24H Jonel Lonni SQUIBB, MD   30 mg at 03/20/24 1015   feeding supplement (BOOST / RESOURCE BREEZE) liquid 1 Container  1 Container Oral TID BM Danford, Lonni SQUIBB, MD   1 Container at  03/20/24 1100   hydrALAZINE  (APRESOLINE ) tablet 25 mg  25 mg Oral Q8H PRN Danford, Lonni SQUIBB, MD       Influenza vac split trivalent PF (FLUZONE  HIGH-DOSE) injection 0.5 mL  0.5 mL Intramuscular Tomorrow-1000 Danford, Lonni SQUIBB, MD       insulin  aspart (novoLOG ) injection 0-15 Units  0-15 Units Subcutaneous TID WC Danford, Lonni SQUIBB, MD   3 Units at 03/20/24 1005   levothyroxine  (SYNTHROID ) tablet 88 mcg  88 mcg Oral Q0600 Mitchell, Madeline I, RPH   88 mcg at 03/20/24 9460   metoprolol  succinate (TOPROL -XL) 24 hr tablet 25 mg  25 mg Oral  Daily Jonel Lonni SQUIBB, MD   25 mg at 03/20/24 1049   metroNIDAZOLE (FLAGYL) IVPB 500 mg  500 mg Intravenous Q12H Ann Fine, MD 100 mL/hr at 03/20/24 1059 500 mg at 03/20/24 1059   oxyCODONE -acetaminophen  (PERCOCET/ROXICET) 5-325 MG per tablet 1 tablet  1 tablet Oral Q6H PRN Marten Larraine HERO, RPH   1 tablet at 03/20/24 0840   And   oxyCODONE  (Oxy IR/ROXICODONE ) immediate release tablet 5 mg  5 mg Oral Q6H PRN Marten Larraine HERO, RPH   5 mg at 03/20/24 0840   pantoprazole  (PROTONIX ) injection 40 mg  40 mg Intravenous Q12H Gerkin, Todd, MD   40 mg at 03/20/24 1049   prochlorperazine (COMPAZINE) injection 5 mg  5 mg Intravenous Q6H PRN Opyd, Evalene RAMAN, MD       Allergies as of 03/18/2024 - Review Complete 03/18/2024  Allergen Reaction Noted   Empagliflozin Other (See Comments) 03/18/2024   Sulfa antibiotics Itching, Rash, and Dermatitis 01/07/2011   Review of Systems:  Review of Systems  Respiratory:  Negative for shortness of breath.   Cardiovascular:  Negative for chest pain.  Gastrointestinal:  Positive for abdominal pain. Negative for constipation and diarrhea.    OBJECTIVE:   Temp:  [97.9 F (36.6 C)-98.7 F (37.1 C)] 97.9 F (36.6 C) (10/20 1006) Pulse Rate:  [79-97] 83 (10/20 1006) Resp:  [14-17] 17 (10/20 0437) BP: (105-129)/(50-63) 129/52 (10/20 1006) SpO2:  [96 %-98 %] 98 % (10/20 1006) Last BM Date : 03/18/24 Physical Exam Constitutional:      General: She is not in acute distress.    Appearance: She is not ill-appearing, toxic-appearing or diaphoretic.  Cardiovascular:     Rate and Rhythm: Tachycardia present. Rhythm irregular.  Pulmonary:     Effort: No respiratory distress.     Breath sounds: Normal breath sounds.  Abdominal:     General: Bowel sounds are normal. There is no distension.     Palpations: Abdomen is soft.     Tenderness: There is abdominal tenderness. There is no guarding.  Musculoskeletal:        General: Tenderness (bilateral lower  extremities) present.  Neurological:     Mental Status: She is alert.     Labs: Recent Labs    03/18/24 0152 03/19/24 0810 03/20/24 0743  WBC 14.1* 12.9* 13.1*  HGB 14.2 13.9 12.2  HCT 43.0 41.5 36.5  PLT 323 252 260   BMET Recent Labs    03/18/24 0152 03/19/24 0810 03/20/24 0743  NA 135 136 139  K 3.7 3.0* 3.6  CL 91* 100 106  CO2 25 21* 20*  GLUCOSE 149* 256* 156*  BUN 66* 47* 37*  CREATININE 2.35* 1.53* 1.27*  CALCIUM  9.9 8.3* 7.8*   LFT Recent Labs    03/19/24 0810  PROT 5.7*  ALBUMIN 2.6*  AST 17  ALT  13  ALKPHOS 27*  BILITOT 0.6   PT/INR No results for input(s): LABPROT, INR in the last 72 hours. Diagnostic imaging: ECHOCARDIOGRAM COMPLETE Result Date: 03/19/2024    ECHOCARDIOGRAM REPORT   Patient Name:   KELLIE MURRILL Date of Exam: 03/19/2024 Medical Rec #:  [id number]     Height:       62.0 in Accession #:    [id number]    Weight:       159.2 lb Date of Birth:  [birth year]     BSA:          1.735 m Patient Age:    78 years      BP:           162/95 mmHg Patient Gender: F             HR:           68 bpm. Exam Location:  Inpatient Procedure: 2D Echo (Both Spectral and Color Flow Doppler were utilized during            procedure). Indications:    congestive heart failure  History:        Patient has prior history of Echocardiogram examinations, most                 recent 04/02/2014. Chronic kidney disease; Risk Factors:Diabetes,                 Hypertension and Current Smoker.  Sonographer:    Tinnie Barefoot RDCS Referring Phys: [id number] CHRISTOPHER P DANFORD  Sonographer Comments: Suboptimal subcostal window and patient is obese. Image acquisition challenging due to respiratory motion, patient in pain with minimal pressure. and Image acquisition challenging due to patient body habitus. IMPRESSIONS  1. Left ventricular ejection fraction, by estimation, is 60 to 65%. The left ventricle has normal function. The left ventricle has no regional wall motion  abnormalities. Left ventricular diastolic parameters are consistent with Grade I diastolic dysfunction (impaired relaxation).  2. Right ventricular systolic function is moderately reduced. The right ventricular size is moderately enlarged.  3. Right atrial size was mildly dilated.  4. The mitral valve is abnormal. No evidence of mitral valve regurgitation. No evidence of mitral stenosis.  5. The aortic valve is normal in structure. Aortic valve regurgitation is not visualized. No aortic stenosis is present.  6. The inferior vena cava is normal in size with greater than 50% respiratory variability, suggesting right atrial pressure of 3 mmHg. FINDINGS  Left Ventricle: Left ventricular ejection fraction, by estimation, is 60 to 65%. The left ventricle has normal function. The left ventricle has no regional wall motion abnormalities. Strain was performed and the global longitudinal strain is indeterminate. The left ventricular internal cavity size was normal in size. There is no left ventricular hypertrophy. Left ventricular diastolic parameters are consistent with Grade I diastolic dysfunction (impaired relaxation). Right Ventricle: The right ventricular size is moderately enlarged. No increase in right ventricular wall thickness. Right ventricular systolic function is moderately reduced. Left Atrium: Left atrial size was normal in size. Right Atrium: Right atrial size was mildly dilated. Pericardium: There is no evidence of pericardial effusion. Mitral Valve: The mitral valve is abnormal. There is mild thickening of the mitral valve leaflet(s). There is mild calcification of the mitral valve leaflet(s). Mild mitral annular calcification. No evidence of mitral valve regurgitation. No evidence of mitral valve stenosis. Tricuspid Valve: The tricuspid valve is normal in structure. Tricuspid valve regurgitation is not demonstrated. No evidence of tricuspid  stenosis. Aortic Valve: The aortic valve is normal in structure.  Aortic valve regurgitation is not visualized. No aortic stenosis is present. Pulmonic Valve: The pulmonic valve was normal in structure. Pulmonic valve regurgitation is not visualized. No evidence of pulmonic stenosis. Aorta: The aortic root is normal in size and structure. Venous: The inferior vena cava is normal in size with greater than 50% respiratory variability, suggesting right atrial pressure of 3 mmHg. IAS/Shunts: The interatrial septum was not well visualized. Additional Comments: 3D was performed not requiring image post processing on an independent workstation and was indeterminate.  LEFT VENTRICLE PLAX 2D LVIDd:         4.00 cm LVIDs:         2.90 cm LVOT diam:     2.20 cm LV SV:         82 LV SV Index:   47 LVOT Area:     3.80 cm  RIGHT VENTRICLE RV Basal diam:  2.40 cm LEFT ATRIUM           Index        RIGHT ATRIUM           Index LA diam:      3.50 cm 2.02 cm/m   RA Area:     12.90 cm LA Vol (A4C): 63.8 ml 36.77 ml/m  RA Volume:   27.40 ml  15.79 ml/m  AORTIC VALVE LVOT Vmax:   127.33 cm/s LVOT Vmean:  77.133 cm/s LVOT VTI:    0.216 m  AORTA Ao Root diam: 3.30 cm Ao Asc diam:  3.10 cm  SHUNTS Systemic VTI:  0.22 m Systemic Diam: 2.20 cm Maude Emmer MD Electronically signed by Maude Emmer MD Signature Date/Time: 03/19/2024/4:03:49 PM    Final    IMPRESSION: Abdominal pain Abnormal CT imaging History Roux-en-Y gastric bypass 2012 GERD  PLAN: -Await results of small bowel follow through, General Surgery following -Agree with Dr. Saintclair for HIDA scan, await small bowel follow through results first -Continue PPI therapy, antiemetics, antibiotics -Clear liquid diet -[facility] will follow   LOS: 1 day   Estefana Keas, [facility]

## 2024-03-20 NOTE — Progress Notes (Signed)
  Progress Note   Patient: Sharon Lowe FMW:[id number] DOB: 1945-11-19 DOA: 03/18/2024     1 DOS: the patient was seen and examined on 03/20/2024 at 10:33 AM      Brief hospital course: 78 y.o. F with obesity, hx gastric bypass, HTN, HLD, DM, CKD IIIb baseline 1.3-1.4 as recently as August, and recent treatment with Lasix for leg swelling who presents with upper abdominal pain, N/V, general weakness.     In the ER, Cr 2.35, WBC 14.1, and lipase 68. CT showed wall-thickening of afferent limb of gastric bypass, gallstones.   Admitted for AKI, abdominal pain.  Abdominal pain worsened, Gen Surg and GI consulted.      Assessment and Plan: AKI on CKDIIIb See summary from H&P.  UA with pyuria, but she has been off omeprazole  for a while. FENa low, and Cr improved with fluids.  Resolved. -Hold Lasix, lisinopril , HCTZ, liraglutide , metformin  -Stop IV fluids     Abdominal pain Back pain Leukocytosis See summary from 10/19 Upper GI series unremarkable today, and on my exam pain is unchanged - Clear liquid diet - Consult general surgery and GI     Weight loss with diuresis Leg swelling Cardiogram unremarkable   Sinus arrhythmia Sinus rhythm, no further telemetry needed   Hypertension Blood pressure normal - Hold HCTZ, Lasix, lisinopril  given renal failure - As needed hydralazine  for severe pressures   Hyperlipidemia -Hold Lipitor   Diabetes Glucose high normal -Continue sliding scale correction insulin  - Hold liraglutide  and metformin    Hypothyroidism -Continue levothyroxine   Hypokalemia - Supplement potassium       Subjective: Patient is having a lot of abdominal pain today.  General surgery saw her last night, recommended upper GI series and HIDA scan.  GI evaluated her today, recommend follow-up UGI S     Physical Exam: BP (!) 115/49   Pulse 91   Temp 98.3 F (36.8 C) (Oral)   Resp 18   Ht 5' 2 (1.575 m)   Wt 72.2 kg   SpO2 97%   BMI 29.11  kg/m   Elderly adult female, lying in bed, interactive and appropriate RRR, no murmurs, no peripheral edema Respiratory rate normal, lungs clear without rales or wheezes, overall diminished due to pain Abdomen soft, no masses appreciated.  She has marked tenderness throughout, seems worse in the left upper quadrant.  She is guarding, she has no rigidity or rebound Attention normal, affect blunted by pain, appears quite uncomfortable, upper extremity strength normal, face symmetric     Data Reviewed: Discussed with general surgery and GI Basic metabolic panel shows creatinine 1.2, electrolytes normal CBC shows mild leukocytosis     Disposition:         Author: Lonni SHAUNNA Dalton, MD 03/20/2024 6:56 PM  For on call review [email or web address].

## 2024-03-21 ENCOUNTER — Inpatient Hospital Stay (HOSPITAL_COMMUNITY): Admitting: Anesthesiology

## 2024-03-21 ENCOUNTER — Encounter (HOSPITAL_COMMUNITY): Admission: EM | Disposition: A | Payer: Self-pay | Source: Home / Self Care | Attending: Family Medicine

## 2024-03-21 DIAGNOSIS — I129 Hypertensive chronic kidney disease with stage 1 through stage 4 chronic kidney disease, or unspecified chronic kidney disease: Secondary | ICD-10-CM

## 2024-03-21 DIAGNOSIS — N1832 Chronic kidney disease, stage 3b: Secondary | ICD-10-CM

## 2024-03-21 DIAGNOSIS — F17210 Nicotine dependence, cigarettes, uncomplicated: Secondary | ICD-10-CM

## 2024-03-21 DIAGNOSIS — K259 Gastric ulcer, unspecified as acute or chronic, without hemorrhage or perforation: Secondary | ICD-10-CM

## 2024-03-21 DIAGNOSIS — N179 Acute kidney failure, unspecified: Secondary | ICD-10-CM | POA: Diagnosis not present

## 2024-03-21 HISTORY — PX: ESOPHAGOGASTRODUODENOSCOPY: SHX5428

## 2024-03-21 LAB — CBC
HCT: 34 % — ABNORMAL LOW (ref 36.0–46.0)
Hemoglobin: 11.2 g/dL — ABNORMAL LOW (ref 12.0–15.0)
MCH: 33.1 pg (ref 26.0–34.0)
MCHC: 32.9 g/dL (ref 30.0–36.0)
MCV: 100.6 fL — ABNORMAL HIGH (ref 80.0–100.0)
Platelets: 251 K/uL (ref 150–400)
RBC: 3.38 MIL/uL — ABNORMAL LOW (ref 3.87–5.11)
RDW: 13.3 % (ref 11.5–15.5)
WBC: 10 K/uL (ref 4.0–10.5)
nRBC: 0 % (ref 0.0–0.2)

## 2024-03-21 LAB — BASIC METABOLIC PANEL WITH GFR
Anion gap: 8 (ref 5–15)
BUN: 32 mg/dL — ABNORMAL HIGH (ref 8–23)
CO2: 24 mmol/L (ref 22–32)
Calcium: 7.8 mg/dL — ABNORMAL LOW (ref 8.9–10.3)
Chloride: 105 mmol/L (ref 98–111)
Creatinine, Ser: 1.18 mg/dL — ABNORMAL HIGH (ref 0.44–1.00)
GFR, Estimated: 47 mL/min — ABNORMAL LOW (ref >60)
Glucose, Bld: 124 mg/dL — ABNORMAL HIGH (ref 70–99)
Potassium: 3.3 mmol/L — ABNORMAL LOW (ref 3.5–5.1)
Sodium: 137 mmol/L (ref 135–145)

## 2024-03-21 LAB — GLUCOSE, CAPILLARY
Glucose-Capillary: 104 mg/dL — ABNORMAL HIGH (ref 70–99)
Glucose-Capillary: 137 mg/dL — ABNORMAL HIGH (ref 70–99)
Glucose-Capillary: 143 mg/dL — ABNORMAL HIGH (ref 70–99)

## 2024-03-21 SURGERY — EGD (ESOPHAGOGASTRODUODENOSCOPY)
Anesthesia: Monitor Anesthesia Care

## 2024-03-21 MED ORDER — LIDOCAINE 2% (20 MG/ML) 5 ML SYRINGE
INTRAMUSCULAR | Status: DC | PRN
  Administered 2024-03-21: 50 mg via INTRAVENOUS

## 2024-03-21 MED ORDER — SODIUM CHLORIDE 0.9 % IV SOLN: INTRAVENOUS | Status: DC

## 2024-03-21 MED ORDER — POLYETHYLENE GLYCOL 3350 17 G PO PACK
17.0000 g | PACK | Freq: Every day | ORAL | Status: DC
  Administered 2024-03-21 – 2024-03-22 (×2): 17 g via ORAL
  Filled 2024-03-21 (×4): qty 1

## 2024-03-21 MED ORDER — HYDROCHLOROTHIAZIDE 25 MG PO TABS
25.0000 mg | ORAL_TABLET | Freq: Every day | ORAL | Status: DC
  Administered 2024-03-22 – 2024-03-23 (×2): 25 mg via ORAL
  Filled 2024-03-21 (×3): qty 1

## 2024-03-21 MED ORDER — GLYCOPYRROLATE PF 0.2 MG/ML IJ SOSY
PREFILLED_SYRINGE | INTRAMUSCULAR | Status: DC | PRN
  Administered 2024-03-21: .1 mg via INTRAVENOUS

## 2024-03-21 MED ORDER — EPHEDRINE SULFATE (PRESSORS) 25 MG/5ML IV SOSY
PREFILLED_SYRINGE | INTRAVENOUS | Status: DC | PRN
  Administered 2024-03-21: 10 mg via INTRAVENOUS

## 2024-03-21 MED ORDER — PHENYLEPHRINE HCL-NACL 20-0.9 MG/250ML-% IV SOLN
INTRAVENOUS | Status: DC | PRN
  Administered 2024-03-21: 20 ug/min via INTRAVENOUS

## 2024-03-21 MED ORDER — FAMOTIDINE 20 MG PO TABS
20.0000 mg | ORAL_TABLET | Freq: Every day | ORAL | Status: DC
  Administered 2024-03-21 – 2024-03-23 (×3): 20 mg via ORAL
  Filled 2024-03-21 (×3): qty 1

## 2024-03-21 MED ORDER — POTASSIUM CHLORIDE 20 MEQ PO PACK
40.0000 meq | PACK | Freq: Once | ORAL | Status: AC
  Administered 2024-03-21: 40 meq via ORAL
  Filled 2024-03-21: qty 2

## 2024-03-21 MED ORDER — SUCRALFATE 1 GM/10ML PO SUSP
1.0000 g | Freq: Three times a day (TID) | ORAL | Status: DC
  Administered 2024-03-21 – 2024-03-23 (×8): 1 g via ORAL
  Filled 2024-03-21 (×8): qty 10

## 2024-03-21 MED ORDER — PROPOFOL 500 MG/50ML IV EMUL
INTRAVENOUS | Status: DC | PRN
  Administered 2024-03-21: 120 ug/kg/min via INTRAVENOUS

## 2024-03-21 NOTE — Plan of Care (Signed)
 Pt NPO since midnight for upper GI this morning.   Problem: Education: Goal: Ability to describe self-care measures that may prevent or decrease complications (Diabetes Survival Skills Education) will improve Outcome: Progressing   Problem: Coping: Goal: Ability to adjust to condition or change in health will improve Outcome: Progressing   Problem: Fluid Volume: Goal: Ability to maintain a balanced intake and output will improve Outcome: Progressing   Problem: Nutritional: Goal: Maintenance of adequate nutrition will improve Outcome: Progressing   Problem: Skin Integrity: Goal: Risk for impaired skin integrity will decrease Outcome: Progressing   Problem: Clinical Measurements: Goal: Will remain free from infection Outcome: Progressing   Problem: Coping: Goal: Level of anxiety will decrease Outcome: Progressing   Problem: Elimination: Goal: Will not experience complications related to urinary retention Outcome: Progressing

## 2024-03-21 NOTE — Interval H&P Note (Signed)
 History and Physical Interval Note:  03/21/2024 10:28 AM  Sharon Lowe  has presented today for surgery, with the diagnosis of LUQ pain.  The various methods of treatment have been discussed with the patient and family. After consideration of risks, benefits and other options for treatment, the patient has consented to  Procedure(s): EGD (ESOPHAGOGASTRODUODENOSCOPY) (N/A) as a surgical intervention.  The patient's history has been reviewed, patient examined, no change in status, stable for surgery.  I have reviewed the patient's chart and labs.  Questions were answered to the patient's satisfaction.     Estefana VEAR Keas

## 2024-03-21 NOTE — Op Note (Signed)
 [facility] Patient Name: Sharon Lowe Procedure Date : 03/21/2024 MRN: [id number] Attending MD: Estefana Keas DO, DO, [id number] Date of Birth: Jul 03, 1945 CSN: [id number] Age: 78 Admit Type: Inpatient Procedure:                Upper GI endoscopy Indications:              Abdominal pain in the left upper quadrant Providers:                Estefana Keas DO, DO, Ozell Pouch, Curtistine Bishop, Technician Referring MD:              Medicines:                See the Anesthesia note for documentation of the                            administered medications Complications:            No immediate complications. Estimated Blood Loss:     Estimated blood loss: none. Procedure:                Pre-Anesthesia Assessment:                           - ASA Grade Assessment: III - A patient with severe                            systemic disease.                           - The risks and benefits of the procedure and the                            sedation options and risks were discussed with the                            patient. All questions were answered and informed                            consent was obtained.                           After obtaining informed consent, the endoscope was                            passed under direct vision. Throughout the                            procedure, the patient's blood pressure, pulse, and                            oxygen saturations were monitored continuously. The                            [id number]-[id number] ([id number]) Olympus endoscope  was introduced                            through the mouth, and advanced to the efferent                            jejunal loop. The upper GI endoscopy was                            accomplished without difficulty. The patient                            tolerated the procedure well. Scope In: Scope Out: Findings:      The examined esophagus was normal.       Evidence of a Roux-en-Y gastrojejunostomy was found. The gastrojejunal       anastomosis was characterized by 2 ulcerations, 1 large ulceration (~2       cm), with eschar and slough, 1 smaller ulceration (~5 mm) with eschar,       no visible vessel noted with either ulceration. Gastrojejunal       anastomosis was traversed. The duodenum-to-jejunum limb was not examined       as it could not be reached. The excluded stomach was not examined as it       could not be reached.      The examined jejunum was normal. Impression:               - Normal esophagus.                           - Roux-en-Y gastrojejunostomy with gastrojejunal                            anastomosis characterized by large ulceration (~2                            cm), with eschar and slough, no visible vessel                            noted.                           - Normal examined jejunum.                           - No specimens collected. Recommendation:           - Return patient to hospital ward for ongoing care.                           - Clear liquid diet for 1 day.                           - Continue present medications.                           - Start sucralfate suspension for next 7 days.                           -  Check H.Pylori stool antigen for completeness.                           - Further workup for back pain per primary team. Procedure Code(s):        --- Professional ---                           [phone], Esophagogastroduodenoscopy, flexible,                            transoral; diagnostic, including collection of                            specimen(s) by brushing or washing, when performed                            (separate procedure) Diagnosis Code(s):        --- Professional ---                           R10.12, Left upper quadrant pain CPT copyright 2022 [employer]. All rights reserved. The codes documented in this report are preliminary and upon coder review may  be  revised to meet current compliance requirements. Dr Estefana Keas, DO Estefana Keas DO, DO 03/21/2024 11:21:09 AM Number of Addenda: 0

## 2024-03-21 NOTE — Progress Notes (Signed)
 Progress Note     Subjective: Patient reports continued concerns of abdominal pain and back pain with movement. She does have a hard time describing her pain. Reports that her abdominal pain is not as severe while being still. Has not had a BM. Having flatulence. Denies nausea and vomiting. Currently NPO.  ROS  All negative with the exception of above.  Objective: Vital signs in last 24 hours: Temp:  [97.8 F (36.6 C)-98.3 F (36.8 C)] 97.8 F (36.6 C) (10/21 0515) Pulse Rate:  [73-91] 78 (10/21 0515) Resp:  [18] 18 (10/21 0515) BP: (115-144)/(49-80) 144/64 (10/21 0515) SpO2:  [97 %-99 %] 99 % (10/21 0515) Last BM Date : 03/18/24  Intake/Output from previous day: 10/20 0701 - 10/21 0700 In: 240 [P.O.:240] Out: -  Intake/Output this shift: No intake/output data recorded.  PE: General: Elderly female who is laying in bed in NAD. HEENT: Head is normocephalic, atraumatic.  Sclera are noninjected.  Heart: HR normal. Lungs: Respiratory effort nonlabored. Abd: Soft with mild distention. Generalized tenderness to palpation of all quadrants. Some guarding. +BS. Skin: Warm and dry. Psych: A&Ox3 with an appropriate affect.    Lab Results:  Recent Labs    03/20/24 0743 03/21/24 0408  WBC 13.1* 10.0  HGB 12.2 11.2*  HCT 36.5 34.0*  PLT 260 251   BMET Recent Labs    03/20/24 0743 03/21/24 0408  NA 139 137  K 3.6 3.3*  CL 106 105  CO2 20* 24  GLUCOSE 156* 124*  BUN 37* 32*  CREATININE 1.27* 1.18*  CALCIUM  7.8* 7.8*   PT/INR No results for input(s): LABPROT, INR in the last 72 hours. CMP     Component Value Date/Time   NA 137 03/21/2024 0408   K 3.3 (L) 03/21/2024 0408   CL 105 03/21/2024 0408   CO2 24 03/21/2024 0408   GLUCOSE 124 (H) 03/21/2024 0408   BUN 32 (H) 03/21/2024 0408   CREATININE 1.18 (H) 03/21/2024 0408   CALCIUM  7.8 (L) 03/21/2024 0408   PROT 5.7 (L) 03/19/2024 0810   ALBUMIN 2.6 (L) 03/19/2024 0810   AST 17 03/19/2024 0810   ALT 13  03/19/2024 0810   ALKPHOS 27 (L) 03/19/2024 0810   BILITOT 0.6 03/19/2024 0810   GFRNONAA 47 (L) 03/21/2024 0408   GFRAA 58 (L) 12/12/2014 0425   Lipase     Component Value Date/Time   LIPASE 68 (H) 03/18/2024 0152       Studies/Results: DG UGI W SMALL BOWEL Result Date: 03/20/2024 CLINICAL DATA:  [id number] History of Roux-en-Y gastric bypass [id number] History of Roux-en-Y bypass. Admitted in the hospital secondary to abdominal pain, CT abdomen showing dilated segment of small bowel involving the afferent jejunal anastomoses. Consult for UGI/SBFT for further evaluation. EXAM: DG UGI W/ SMALL BOWEL TECHNIQUE: Single contrast examination was then performed using thin liquid barium. This exam was performed by Kimble Clas, PA-C, and was supervised and interpreted by Dr. Hughes. FLUOROSCOPY: Radiation Exposure Index (as provided by the fluoroscopic device): 51.0 mGy Kerma COMPARISON:  Small-bowel follow-through, 11/04/2010. CT AP, 03/18/2024 and 04/30/2014. FINDINGS: Esophagus:  Normal appearance. Esophageal motility:  Within normal limits. Gastroesophageal reflux:  None visualized. Ingested 13 mm barium tablet: Not given Stomach: Roux-en-Y bypass changes.  No gross abnormality Gastric emptying: Normal. Jejunum: Small focal area of bowel dilation of the jejunal anastomosis, similar to that on CT. Does not impede contrast passage. No indication of focal narrowing/stricture/mass. No contrast extravasation. Other:  None. IMPRESSION: 1. Surgical changes of  Roux-en-Y gastric bypass changes. Otherwise, normal upper GI and small-bowel follow-through. 2. No overt dilatation at the enteral anastomosis within the lower abdomen. No fluoroscopic evidence of obstruction, stenosis or stricture. Electronically Signed   By: Thom Hall M.D.   On: 03/20/2024 14:44   ECHOCARDIOGRAM COMPLETE Result Date: 03/19/2024    ECHOCARDIOGRAM REPORT   Patient Name:   YOANNA JURCZYK Date of Exam: 03/19/2024 Medical Rec #:   [id number]     Height:       62.0 in Accession #:    [id number]    Weight:       159.2 lb Date of Birth:  [birth year]     BSA:          1.735 m Patient Age:    78 years      BP:           162/95 mmHg Patient Gender: F             HR:           68 bpm. Exam Location:  Inpatient Procedure: 2D Echo (Both Spectral and Color Flow Doppler were utilized during            procedure). Indications:    congestive heart failure  History:        Patient has prior history of Echocardiogram examinations, most                 recent 04/02/2014. Chronic kidney disease; Risk Factors:Diabetes,                 Hypertension and Current Smoker.  Sonographer:    Tinnie Barefoot RDCS Referring Phys: [id number] CHRISTOPHER P DANFORD  Sonographer Comments: Suboptimal subcostal window and patient is obese. Image acquisition challenging due to respiratory motion, patient in pain with minimal pressure. and Image acquisition challenging due to patient body habitus. IMPRESSIONS  1. Left ventricular ejection fraction, by estimation, is 60 to 65%. The left ventricle has normal function. The left ventricle has no regional wall motion abnormalities. Left ventricular diastolic parameters are consistent with Grade I diastolic dysfunction (impaired relaxation).  2. Right ventricular systolic function is moderately reduced. The right ventricular size is moderately enlarged.  3. Right atrial size was mildly dilated.  4. The mitral valve is abnormal. No evidence of mitral valve regurgitation. No evidence of mitral stenosis.  5. The aortic valve is normal in structure. Aortic valve regurgitation is not visualized. No aortic stenosis is present.  6. The inferior vena cava is normal in size with greater than 50% respiratory variability, suggesting right atrial pressure of 3 mmHg. FINDINGS  Left Ventricle: Left ventricular ejection fraction, by estimation, is 60 to 65%. The left ventricle has normal function. The left ventricle has no regional wall motion  abnormalities. Strain was performed and the global longitudinal strain is indeterminate. The left ventricular internal cavity size was normal in size. There is no left ventricular hypertrophy. Left ventricular diastolic parameters are consistent with Grade I diastolic dysfunction (impaired relaxation). Right Ventricle: The right ventricular size is moderately enlarged. No increase in right ventricular wall thickness. Right ventricular systolic function is moderately reduced. Left Atrium: Left atrial size was normal in size. Right Atrium: Right atrial size was mildly dilated. Pericardium: There is no evidence of pericardial effusion. Mitral Valve: The mitral valve is abnormal. There is mild thickening of the mitral valve leaflet(s). There is mild calcification of the mitral valve leaflet(s). Mild mitral annular calcification. No evidence of mitral valve regurgitation.  No evidence of mitral valve stenosis. Tricuspid Valve: The tricuspid valve is normal in structure. Tricuspid valve regurgitation is not demonstrated. No evidence of tricuspid stenosis. Aortic Valve: The aortic valve is normal in structure. Aortic valve regurgitation is not visualized. No aortic stenosis is present. Pulmonic Valve: The pulmonic valve was normal in structure. Pulmonic valve regurgitation is not visualized. No evidence of pulmonic stenosis. Aorta: The aortic root is normal in size and structure. Venous: The inferior vena cava is normal in size with greater than 50% respiratory variability, suggesting right atrial pressure of 3 mmHg. IAS/Shunts: The interatrial septum was not well visualized. Additional Comments: 3D was performed not requiring image post processing on an independent workstation and was indeterminate.  LEFT VENTRICLE PLAX 2D LVIDd:         4.00 cm LVIDs:         2.90 cm LVOT diam:     2.20 cm LV SV:         82 LV SV Index:   47 LVOT Area:     3.80 cm  RIGHT VENTRICLE RV Basal diam:  2.40 cm LEFT ATRIUM           Index         RIGHT ATRIUM           Index LA diam:      3.50 cm 2.02 cm/m   RA Area:     12.90 cm LA Vol (A4C): 63.8 ml 36.77 ml/m  RA Volume:   27.40 ml  15.79 ml/m  AORTIC VALVE LVOT Vmax:   127.33 cm/s LVOT Vmean:  77.133 cm/s LVOT VTI:    0.216 m  AORTA Ao Root diam: 3.30 cm Ao Asc diam:  3.10 cm  SHUNTS Systemic VTI:  0.22 m Systemic Diam: 2.20 cm Maude Emmer MD Electronically signed by Maude Emmer MD Signature Date/Time: 03/19/2024/4:03:49 PM    Final     Anti-infectives: Anti-infectives (From admission, onward)    Start     Dose/Rate Route Frequency Ordered Stop   03/19/24 0600  cefTRIAXone  (ROCEPHIN ) 2 g in sodium chloride  0.9 % 100 mL IVPB        2 g 200 mL/hr over 30 Minutes Intravenous Every 24 hours 03/18/24 1359     03/18/24 2230  metroNIDAZOLE (FLAGYL) IVPB 500 mg        500 mg 100 mL/hr over 60 Minutes Intravenous Every 12 hours 03/18/24 2139     03/18/24 1500  cefTRIAXone  (ROCEPHIN ) 1 g in sodium chloride  0.9 % 100 mL IVPB        1 g 200 mL/hr over 30 Minutes Intravenous  Once 03/18/24 1412 03/18/24 1506   03/18/24 0530  cefTRIAXone  (ROCEPHIN ) 1 g in sodium chloride  0.9 % 100 mL IVPB        1 g 200 mL/hr over 30 Minutes Intravenous  Once 03/18/24 0524 03/18/24 0753        Assessment/Plan 78 y.o. female with CKD, DM, hx of gastric bypass in 2012 by Dr. Adina Lunger, HTN, AKI on CKD presenting with several days of vomiting, epigastric pain, and generalized abdominal tenderness.    -Afebrile. -WBC 10.0. HGB 11.2   Afferent limb dilation/thickening  - UGI 10/20 was unremarkable - Continue IV abx - Continue PPI - GI also following.   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 Recommend HIDA scan.     - FEN - NPO currently, IV per primary team - VTE - SCDs, Enoxaparin  - ID -  Ceftriaxone /metronidazole     Per [facility] AKI CKDIIIb Weight loss Leg swelling - Echo completed 10/19 Sinus  arrhythmia HTN HLD DM Hypothyroidism Hypokalemia    LOS: 2 days   I reviewed specialist notes, hospitalist notes, nursing notes, last 24 h vitals and pain scores, last 48 h intake and output, last 24 h labs and trends, and last 24 h imaging results.  This care required moderate level of medical decision making.    Marjorie Carlyon Favre, [facility] Surgery 03/21/2024, 7:55 AM Please see Amion for pager number during day hours 7:00am-4:30pm

## 2024-03-21 NOTE — Anesthesia Preprocedure Evaluation (Addendum)
 Anesthesia Evaluation  Patient identified by MRN, date of birth, ID band Patient awake    Reviewed: Allergy & Precautions, NPO status , Patient's Chart, lab work & pertinent test results, reviewed documented beta blocker date and time   Airway Mallampati: I  TM Distance: >3 FB Neck ROM: Full    Dental no notable dental hx. (+) Teeth Intact, Dental Advisory Given   Pulmonary sleep apnea , Current Smoker   Pulmonary exam normal breath sounds clear to auscultation       Cardiovascular hypertension, Pt. on medications and Pt. on home beta blockers Normal cardiovascular exam Rhythm:Regular Rate:Normal  TTE 2025  1. Left ventricular ejection fraction, by estimation, is 60 to 65%. The  left ventricle has normal function. The left ventricle has no regional  wall motion abnormalities. Left ventricular diastolic parameters are  consistent with Grade I diastolic  dysfunction (impaired relaxation).   2. Right ventricular systolic function is moderately reduced. The right  ventricular size is moderately enlarged.   3. Right atrial size was mildly dilated.   4. The mitral valve is abnormal. No evidence of mitral valve  regurgitation. No evidence of mitral stenosis.   5. The aortic valve is normal in structure. Aortic valve regurgitation is  not visualized. No aortic stenosis is present.   6. The inferior vena cava is normal in size with greater than 50%  respiratory variability, suggesting right atrial pressure of 3 mmHg.     Neuro/Psych  PSYCHIATRIC DISORDERS  Depression    TIA   GI/Hepatic Neg liver ROS,GERD  ,,  Endo/Other  diabetes, Type 2, Oral Hypoglycemic AgentsHypothyroidism    Renal/GU Renal InsufficiencyRenal diseaseLab Results      Component                Value               Date                      NA                       137                 03/21/2024                CL                       105                  03/21/2024                K                        3.3 (L)             03/21/2024                CO2                      24                  03/21/2024                BUN                      32 (H)  03/21/2024                CREATININE               1.18 (H)            03/21/2024                GFRNONAA                 47 (L)              03/21/2024                CALCIUM                   7.8 (L)             03/21/2024                ALBUMIN                  2.6 (L)             03/19/2024                GLUCOSE                  124 (H)             03/21/2024             negative genitourinary   Musculoskeletal  (+) Arthritis ,    Abdominal   Peds  Hematology negative hematology ROS (+)   Anesthesia Other Findings   Reproductive/Obstetrics                              Anesthesia Physical Anesthesia Plan  ASA: 3  Anesthesia Plan: MAC   Post-op Pain Management:    Induction: Intravenous  PONV Risk Score and Plan: Propofol  infusion and Treatment may vary due to age or medical condition  Airway Management Planned: Natural Airway  Additional Equipment:   Intra-op Plan:   Post-operative Plan:   Informed Consent: I have reviewed the patients History and Physical, chart, labs and discussed the procedure including the risks, benefits and alternatives for the proposed anesthesia with the patient or authorized representative who has indicated his/her understanding and acceptance.     Dental advisory given  Plan Discussed with: CRNA  Anesthesia Plan Comments:          Anesthesia Quick Evaluation

## 2024-03-21 NOTE — Progress Notes (Signed)
 Mobility Specialist Progress Note:    03/21/24 1014  Mobility  Activity Pivoted/transferred to/from [facility]  Level of Assistance Contact guard assist, steadying assist  Assistive Device Other (Comment) (HHA)  Distance Ambulated (ft) 6 ft  Activity Response Tolerated well  Mobility Referral Yes  Mobility visit 1 Mobility  Mobility Specialist Start Time (ACUTE ONLY) 1003  Mobility Specialist Stop Time (ACUTE ONLY) 1013  Mobility Specialist Time Calculation (min) (ACUTE ONLY) 10 min   Received pt in bed requesting assistance to the [facility]. Pt required MinG for safety. Further mobility deferred d/t pt having a procedure. No c/o. Returned pt to bed without fault. All needs met.  Lavanda Pollack Mobility Specialist  Please contact via [employer] or  Rehab Office [phone]

## 2024-03-21 NOTE — Progress Notes (Signed)
 [facility] Progress Note  SUBJECTIVE:   Interval history: Sharon Lowe was seen and evaluated today at bedside. Resting in bed, daughter at bedside. Noted that her back is the most painful symptom that she is experiencing, it is worst when she is moving. She does not want to complete testing in which she has to move. Her abdominal pain is located in LUQ, intermittent, sometimes worse with eating. Possibly improved with PPI therapy in outpatient setting. PPI therapy was discontinued in outpatient setting given concern for interstitial nephritis.  Past Medical History:  Diagnosis Date   Arthritis    Bilateral ureteral calculi    Depression    mild   GERD (gastroesophageal reflux disease)    Heart murmur    asymptomatic   History of kidney stones 2015   History of sleep apnea    hx of prior to gastric bypass, no problems since   History of TIA (transient ischemic attack)    ~2000-- no residual   Hyperlipidemia    Hypertension    Hypothyroidism    Pneumonia    hx of as a child   Type 2 diabetes mellitus (HCC)    Urgency of urination    Wears contact lenses    Past Surgical History:  Procedure Laterality Date   CARPAL TUNNEL RELEASE Right    CATARACT EXTRACTION Bilateral 2015   CYSTOSCOPY W/ URETERAL STENT PLACEMENT Bilateral 12/05/2013   Procedure: CYSTOSCOPY WITH RETROGRADE PYELOGRAM/URETERAL STENT PLACEMENT;  Surgeon: Morene LELON Salines, MD;  Location: [facility];  Service: Urology;  Laterality: Bilateral;   CYSTOSCOPY WITH URETEROSCOPY AND STENT PLACEMENT Bilateral 12/20/2013   Procedure: CYSTOSCOPY WITH BILATERAL URETEROSCOPY, LASER LITHOTRIPSY AND STENT EXCHANGE;  Surgeon: Morene LELON Salines, MD;  Location: [facility];  Service: Urology;  Laterality: Bilateral;   EXPLORATOMY LAPAROTOMY FOR ABD. ABSCESS FROM MIGRATION OF IUD  1977   HOLMIUM LASER APPLICATION Bilateral 12/20/2013   Procedure: HOLMIUM LASER APPLICATION;  Surgeon: Morene LELON Salines, MD;   Location: [facility];  Service: Urology;  Laterality: Bilateral;   KNEE ARTHROSCOPY Right    LAPAROSCOPIC GASTRIC BYPASS  08-18-2010   AND REPAIR VENTRAL HERNIA   REVISION TOTAL KNEE ARTHROPLASTY Left 01-05-2005   SHOULDER OPEN ROTATOR CUFF REPAIR Left 07-12-2002   TONSILLECTOMY  CHILD   TOTAL ABDOMINAL HYSTERECTOMY W/ BILATERAL SALPINGOOPHORECTOMY  ~1995   TOTAL KNEE ARTHROPLASTY Left 11-17-2002   TOTAL KNEE ARTHROPLASTY Right 12/10/2014   Procedure: TOTAL KNEE ARTHROPLASTY;  Surgeon: Donnice Car, MD;  Location: [facility];  Service: Orthopedics;  Laterality: Right;   TOTAL KNEE REVISION WITH SCAR DEBRIDEMENT/PATELLA REVISION WITH POLY EXCHANGE Left 10/30/2013   Procedure: LEFT  TOTAL  KNEE  REVISION;  Surgeon: Donnice JONETTA Car, MD;  Location: [facility];  Service: Orthopedics;  Laterality: Left;   Current Facility-Administered Medications  Medication Dose Route Frequency Provider Last Rate Last Admin   0.9 %  sodium chloride  infusion   Intravenous Continuous Danford, Lonni SQUIBB, MD       acetaminophen  (TYLENOL ) tablet 650 mg  650 mg Oral Q6H PRN Danford, Lonni SQUIBB, MD       Or   acetaminophen  (TYLENOL ) suppository 650 mg  650 mg Rectal Q6H PRN Danford, Lonni SQUIBB, MD       cefTRIAXone  (ROCEPHIN ) 2 g in sodium chloride  0.9 % 100 mL IVPB  2 g Intravenous Q24H Jonel Lonni SQUIBB, MD 200 mL/hr at 03/21/24 0642 2 g at 03/21/24 0642   enoxaparin  (LOVENOX ) injection 40 mg  40 mg Subcutaneous Q24H Jonel Lonni SQUIBB, MD   40 mg at 03/21/24 0916   famotidine (PEPCID) tablet 20 mg  20 mg Oral Daily Danford, Lonni SQUIBB, MD       feeding supplement (BOOST / RESOURCE BREEZE) liquid 1 Container  1 Container Oral TID BM Danford, Lonni SQUIBB, MD   1 Container at 03/20/24 1100   hydrALAZINE  (APRESOLINE ) tablet 25 mg  25 mg Oral Q8H PRN Danford, Lonni SQUIBB, MD       Influenza vac split trivalent PF (FLUZONE  HIGH-DOSE) injection 0.5 mL  0.5 mL Intramuscular Tomorrow-1000  Danford, Lonni SQUIBB, MD       insulin  aspart (novoLOG ) injection 0-15 Units  0-15 Units Subcutaneous TID WC Jonel Lonni SQUIBB, MD   2 Units at 03/20/24 1800   levothyroxine  (SYNTHROID ) tablet 88 mcg  88 mcg Oral Q0600 Merilee Linsey I, RPH   88 mcg at 03/20/24 9460   metoprolol  succinate (TOPROL -XL) 24 hr tablet 25 mg  25 mg Oral Daily Jonel Lonni SQUIBB, MD   25 mg at 03/21/24 0916   metroNIDAZOLE (FLAGYL) IVPB 500 mg  500 mg Intravenous Q12H Ann Fine, MD 100 mL/hr at 03/20/24 2218 500 mg at 03/20/24 2218   oxyCODONE -acetaminophen  (PERCOCET/ROXICET) 5-325 MG per tablet 1 tablet  1 tablet Oral Q6H PRN Marten Larraine HERO, RPH   1 tablet at 03/21/24 9083   And   oxyCODONE  (Oxy IR/ROXICODONE ) immediate release tablet 5 mg  5 mg Oral Q6H PRN Marten Larraine HERO, RPH   5 mg at 03/20/24 2217   pantoprazole  (PROTONIX ) injection 40 mg  40 mg Intravenous Q12H Gerkin, Todd, MD   40 mg at 03/21/24 9083   potassium chloride  (KLOR-CON ) packet 40 mEq  40 mEq Oral Once Danford, Christopher P, MD       prochlorperazine (COMPAZINE) injection 5 mg  5 mg Intravenous Q6H PRN Opyd, Evalene RAMAN, MD       Allergies as of 03/18/2024 - Review Complete 03/18/2024  Allergen Reaction Noted   Empagliflozin Other (See Comments) 03/18/2024   Sulfa antibiotics Itching, Rash, and Dermatitis 01/07/2011   Review of Systems:  Review of Systems  Gastrointestinal:  Positive for abdominal pain.  Musculoskeletal:  Positive for back pain.    OBJECTIVE:   Temp:  [97.8 F (36.6 C)-98.3 F (36.8 C)] 98.3 F (36.8 C) (10/21 0915) Pulse Rate:  [70-91] 70 (10/21 0915) Resp:  [17-18] 17 (10/21 0915) BP: (115-157)/(49-80) 157/64 (10/21 0915) SpO2:  [97 %-99 %] 99 % (10/21 0915) Last BM Date : 03/18/24 Physical Exam Constitutional:      General: She is not in acute distress.    Appearance: She is not ill-appearing, toxic-appearing or diaphoretic.  Cardiovascular:     Rate and Rhythm: Tachycardia present. Rhythm  irregular.  Pulmonary:     Effort: No respiratory distress.     Breath sounds: Normal breath sounds.  Abdominal:     General: Bowel sounds are normal. There is no distension.     Palpations: Abdomen is soft.     Tenderness: There is abdominal tenderness.  Skin:    General: Skin is warm and dry.  Neurological:     Mental Status: She is alert.     Labs: Recent Labs    03/19/24 0810 03/20/24 0743 03/21/24 0408  WBC 12.9* 13.1* 10.0  HGB 13.9 12.2 11.2*  HCT 41.5 36.5 34.0*  PLT 252 260 251   BMET Recent Labs    03/19/24 0810 03/20/24 0743 03/21/24 0408  NA 136 139 137  K 3.0* 3.6 3.3*  CL 100 106 105  CO2 21* 20* 24  GLUCOSE 256* 156* 124*  BUN 47* 37* 32*  CREATININE 1.53* 1.27* 1.18*  CALCIUM  8.3* 7.8* 7.8*   LFT Recent Labs    03/19/24 0810  PROT 5.7*  ALBUMIN 2.6*  AST 17  ALT 13  ALKPHOS 27*  BILITOT 0.6   PT/INR No results for input(s): LABPROT, INR in the last 72 hours. Diagnostic imaging: DG UGI W SMALL BOWEL Result Date: 03/20/2024 CLINICAL DATA:  [id number] History of Roux-en-Y gastric bypass [id number] History of Roux-en-Y bypass. Admitted in the hospital secondary to abdominal pain, CT abdomen showing dilated segment of small bowel involving the afferent jejunal anastomoses. Consult for UGI/SBFT for further evaluation. EXAM: DG UGI W/ SMALL BOWEL TECHNIQUE: Single contrast examination was then performed using thin liquid barium. This exam was performed by Kimble Clas, PA-C, and was supervised and interpreted by Dr. Hughes. FLUOROSCOPY: Radiation Exposure Index (as provided by the fluoroscopic device): 51.0 mGy Kerma COMPARISON:  Small-bowel follow-through, 11/04/2010. CT AP, 03/18/2024 and 04/30/2014. FINDINGS: Esophagus:  Normal appearance. Esophageal motility:  Within normal limits. Gastroesophageal reflux:  None visualized. Ingested 13 mm barium tablet: Not given Stomach: Roux-en-Y bypass changes.  No gross abnormality Gastric emptying: Normal.  Jejunum: Small focal area of bowel dilation of the jejunal anastomosis, similar to that on CT. Does not impede contrast passage. No indication of focal narrowing/stricture/mass. No contrast extravasation. Other:  None. IMPRESSION: 1. Surgical changes of Roux-en-Y gastric bypass changes. Otherwise, normal upper GI and small-bowel follow-through. 2. No overt dilatation at the enteral anastomosis within the lower abdomen. No fluoroscopic evidence of obstruction, stenosis or stricture. Electronically Signed   By: Thom Hughes M.D.   On: 03/20/2024 14:44   ECHOCARDIOGRAM COMPLETE Result Date: 03/19/2024    ECHOCARDIOGRAM REPORT   Patient Name:   CAILIE BOSSHART Date of Exam: 03/19/2024 Medical Rec #:  [id number]     Height:       62.0 in Accession #:    [id number]    Weight:       159.2 lb Date of Birth:  24-Nov-1945     BSA:          1.735 m Patient Age:    78 years      BP:           162/95 mmHg Patient Gender: F             HR:           68 bpm. Exam Location:  Inpatient Procedure: 2D Echo (Both Spectral and Color Flow Doppler were utilized during            procedure). Indications:    congestive heart failure  History:        Patient has prior history of Echocardiogram examinations, most                 recent 04/02/2014. Chronic kidney disease; Risk Factors:Diabetes,                 Hypertension and Current Smoker.  Sonographer:    Tinnie Barefoot RDCS Referring Phys: [id number] CHRISTOPHER P DANFORD  Sonographer Comments: Suboptimal subcostal window and patient is obese. Image acquisition challenging due to respiratory motion, patient in pain with minimal pressure. and Image acquisition challenging due to patient body habitus. IMPRESSIONS  1. Left ventricular ejection fraction, by estimation, is 60 to 65%. The left ventricle has normal function.  The left ventricle has no regional wall motion abnormalities. Left ventricular diastolic parameters are consistent with Grade I diastolic dysfunction (impaired relaxation).   2. Right ventricular systolic function is moderately reduced. The right ventricular size is moderately enlarged.  3. Right atrial size was mildly dilated.  4. The mitral valve is abnormal. No evidence of mitral valve regurgitation. No evidence of mitral stenosis.  5. The aortic valve is normal in structure. Aortic valve regurgitation is not visualized. No aortic stenosis is present.  6. The inferior vena cava is normal in size with greater than 50% respiratory variability, suggesting right atrial pressure of 3 mmHg. FINDINGS  Left Ventricle: Left ventricular ejection fraction, by estimation, is 60 to 65%. The left ventricle has normal function. The left ventricle has no regional wall motion abnormalities. Strain was performed and the global longitudinal strain is indeterminate. The left ventricular internal cavity size was normal in size. There is no left ventricular hypertrophy. Left ventricular diastolic parameters are consistent with Grade I diastolic dysfunction (impaired relaxation). Right Ventricle: The right ventricular size is moderately enlarged. No increase in right ventricular wall thickness. Right ventricular systolic function is moderately reduced. Left Atrium: Left atrial size was normal in size. Right Atrium: Right atrial size was mildly dilated. Pericardium: There is no evidence of pericardial effusion. Mitral Valve: The mitral valve is abnormal. There is mild thickening of the mitral valve leaflet(s). There is mild calcification of the mitral valve leaflet(s). Mild mitral annular calcification. No evidence of mitral valve regurgitation. No evidence of mitral valve stenosis. Tricuspid Valve: The tricuspid valve is normal in structure. Tricuspid valve regurgitation is not demonstrated. No evidence of tricuspid stenosis. Aortic Valve: The aortic valve is normal in structure. Aortic valve regurgitation is not visualized. No aortic stenosis is present. Pulmonic Valve: The pulmonic valve was normal in  structure. Pulmonic valve regurgitation is not visualized. No evidence of pulmonic stenosis. Aorta: The aortic root is normal in size and structure. Venous: The inferior vena cava is normal in size with greater than 50% respiratory variability, suggesting right atrial pressure of 3 mmHg. IAS/Shunts: The interatrial septum was not well visualized. Additional Comments: 3D was performed not requiring image post processing on an independent workstation and was indeterminate.  LEFT VENTRICLE PLAX 2D LVIDd:         4.00 cm LVIDs:         2.90 cm LVOT diam:     2.20 cm LV SV:         82 LV SV Index:   47 LVOT Area:     3.80 cm  RIGHT VENTRICLE RV Basal diam:  2.40 cm LEFT ATRIUM           Index        RIGHT ATRIUM           Index LA diam:      3.50 cm 2.02 cm/m   RA Area:     12.90 cm LA Vol (A4C): 63.8 ml 36.77 ml/m  RA Volume:   27.40 ml  15.79 ml/m  AORTIC VALVE LVOT Vmax:   127.33 cm/s LVOT Vmean:  77.133 cm/s LVOT VTI:    0.216 m  AORTA Ao Root diam: 3.30 cm Ao Asc diam:  3.10 cm  SHUNTS Systemic VTI:  0.22 m Systemic Diam: 2.20 cm Maude Emmer MD Electronically signed by Maude Emmer MD Signature Date/Time: 03/19/2024/4:03:49 PM    Final    IMPRESSION: Intractable abdominal pain, LUQ, intermittent, painful to palpation diffusely on abdomen  today Abnormal CT imaging, small bowel wall thickening of afferent limb History Roux-en-Y gastric bypass Cholelithiasis and gallbladder sludge Intractable back pain  PLAN: -Small bowel follow through largely unremarkable -Patient is not interested in any testing where she needs to move/lay in such a way that will irritate her back, additionally, unlikely to be able to complete HIDA scan given her need for pain medications -Discussed EGD with patient and her daughter at bedside today, discussed benefits, alternatives and risks of bleeding/infection/perforation/anesthesia, she verbalized understanding and elected to proceed -Start empiric IV PPI therapy  -Maintain  NPO  -Further recommendations to follow pending procedure today   LOS: 2 days   Estefana Keas, [facility]

## 2024-03-21 NOTE — H&P (View-Only) (Signed)
 [facility] Progress Note  SUBJECTIVE:   Interval history: Sharon Lowe was seen and evaluated today at bedside. Resting in bed, daughter at bedside. Noted that her back is the most painful symptom that she is experiencing, it is worst when she is moving. She does not want to complete testing in which she has to move. Her abdominal pain is located in LUQ, intermittent, sometimes worse with eating. Possibly improved with PPI therapy in outpatient setting. PPI therapy was discontinued in outpatient setting given concern for interstitial nephritis.  Past Medical History:  Diagnosis Date   Arthritis    Bilateral ureteral calculi    Depression    mild   GERD (gastroesophageal reflux disease)    Heart murmur    asymptomatic   History of kidney stones 2015   History of sleep apnea    hx of prior to gastric bypass, no problems since   History of TIA (transient ischemic attack)    ~2000-- no residual   Hyperlipidemia    Hypertension    Hypothyroidism    Pneumonia    hx of as a child   Type 2 diabetes mellitus (HCC)    Urgency of urination    Wears contact lenses    Past Surgical History:  Procedure Laterality Date   CARPAL TUNNEL RELEASE Right    CATARACT EXTRACTION Bilateral 2015   CYSTOSCOPY W/ URETERAL STENT PLACEMENT Bilateral 12/05/2013   Procedure: CYSTOSCOPY WITH RETROGRADE PYELOGRAM/URETERAL STENT PLACEMENT;  Surgeon: Morene LELON Salines, MD;  Location: [facility];  Service: Urology;  Laterality: Bilateral;   CYSTOSCOPY WITH URETEROSCOPY AND STENT PLACEMENT Bilateral 12/20/2013   Procedure: CYSTOSCOPY WITH BILATERAL URETEROSCOPY, LASER LITHOTRIPSY AND STENT EXCHANGE;  Surgeon: Morene LELON Salines, MD;  Location: [facility];  Service: Urology;  Laterality: Bilateral;   EXPLORATOMY LAPAROTOMY FOR ABD. ABSCESS FROM MIGRATION OF IUD  1977   HOLMIUM LASER APPLICATION Bilateral 12/20/2013   Procedure: HOLMIUM LASER APPLICATION;  Surgeon: Morene LELON Salines, MD;   Location: [facility];  Service: Urology;  Laterality: Bilateral;   KNEE ARTHROSCOPY Right    LAPAROSCOPIC GASTRIC BYPASS  08-18-2010   AND REPAIR VENTRAL HERNIA   REVISION TOTAL KNEE ARTHROPLASTY Left 01-05-2005   SHOULDER OPEN ROTATOR CUFF REPAIR Left 07-12-2002   TONSILLECTOMY  CHILD   TOTAL ABDOMINAL HYSTERECTOMY W/ BILATERAL SALPINGOOPHORECTOMY  ~1995   TOTAL KNEE ARTHROPLASTY Left 11-17-2002   TOTAL KNEE ARTHROPLASTY Right 12/10/2014   Procedure: TOTAL KNEE ARTHROPLASTY;  Surgeon: Donnice Car, MD;  Location: [facility];  Service: Orthopedics;  Laterality: Right;   TOTAL KNEE REVISION WITH SCAR DEBRIDEMENT/PATELLA REVISION WITH POLY EXCHANGE Left 10/30/2013   Procedure: LEFT  TOTAL  KNEE  REVISION;  Surgeon: Donnice JONETTA Car, MD;  Location: [facility];  Service: Orthopedics;  Laterality: Left;   Current Facility-Administered Medications  Medication Dose Route Frequency Provider Last Rate Last Admin   0.9 %  sodium chloride  infusion   Intravenous Continuous Danford, Lonni SQUIBB, MD       acetaminophen  (TYLENOL ) tablet 650 mg  650 mg Oral Q6H PRN Danford, Lonni SQUIBB, MD       Or   acetaminophen  (TYLENOL ) suppository 650 mg  650 mg Rectal Q6H PRN Danford, Lonni SQUIBB, MD       cefTRIAXone  (ROCEPHIN ) 2 g in sodium chloride  0.9 % 100 mL IVPB  2 g Intravenous Q24H Jonel Lonni SQUIBB, MD 200 mL/hr at 03/21/24 0642 2 g at 03/21/24 0642   enoxaparin  (LOVENOX ) injection 40 mg  40 mg Subcutaneous Q24H Jonel Lonni SQUIBB, MD   40 mg at 03/21/24 0916   famotidine (PEPCID) tablet 20 mg  20 mg Oral Daily Danford, Lonni SQUIBB, MD       feeding supplement (BOOST / RESOURCE BREEZE) liquid 1 Container  1 Container Oral TID BM Danford, Lonni SQUIBB, MD   1 Container at 03/20/24 1100   hydrALAZINE  (APRESOLINE ) tablet 25 mg  25 mg Oral Q8H PRN Danford, Lonni SQUIBB, MD       Influenza vac split trivalent PF (FLUZONE  HIGH-DOSE) injection 0.5 mL  0.5 mL Intramuscular Tomorrow-1000  Danford, Lonni SQUIBB, MD       insulin  aspart (novoLOG ) injection 0-15 Units  0-15 Units Subcutaneous TID WC Jonel Lonni SQUIBB, MD   2 Units at 03/20/24 1800   levothyroxine  (SYNTHROID ) tablet 88 mcg  88 mcg Oral Q0600 Merilee Linsey I, RPH   88 mcg at 03/20/24 9460   metoprolol  succinate (TOPROL -XL) 24 hr tablet 25 mg  25 mg Oral Daily Jonel Lonni SQUIBB, MD   25 mg at 03/21/24 0916   metroNIDAZOLE (FLAGYL) IVPB 500 mg  500 mg Intravenous Q12H Ann Fine, MD 100 mL/hr at 03/20/24 2218 500 mg at 03/20/24 2218   oxyCODONE -acetaminophen  (PERCOCET/ROXICET) 5-325 MG per tablet 1 tablet  1 tablet Oral Q6H PRN Marten Larraine HERO, RPH   1 tablet at 03/21/24 9083   And   oxyCODONE  (Oxy IR/ROXICODONE ) immediate release tablet 5 mg  5 mg Oral Q6H PRN Marten Larraine HERO, RPH   5 mg at 03/20/24 2217   pantoprazole  (PROTONIX ) injection 40 mg  40 mg Intravenous Q12H Gerkin, Todd, MD   40 mg at 03/21/24 9083   potassium chloride  (KLOR-CON ) packet 40 mEq  40 mEq Oral Once Danford, Christopher P, MD       prochlorperazine (COMPAZINE) injection 5 mg  5 mg Intravenous Q6H PRN Opyd, Evalene RAMAN, MD       Allergies as of 03/18/2024 - Review Complete 03/18/2024  Allergen Reaction Noted   Empagliflozin Other (See Comments) 03/18/2024   Sulfa antibiotics Itching, Rash, and Dermatitis 01/07/2011   Review of Systems:  Review of Systems  Gastrointestinal:  Positive for abdominal pain.  Musculoskeletal:  Positive for back pain.    OBJECTIVE:   Temp:  [97.8 F (36.6 C)-98.3 F (36.8 C)] 98.3 F (36.8 C) (10/21 0915) Pulse Rate:  [70-91] 70 (10/21 0915) Resp:  [17-18] 17 (10/21 0915) BP: (115-157)/(49-80) 157/64 (10/21 0915) SpO2:  [97 %-99 %] 99 % (10/21 0915) Last BM Date : 03/18/24 Physical Exam Constitutional:      General: She is not in acute distress.    Appearance: She is not ill-appearing, toxic-appearing or diaphoretic.  Cardiovascular:     Rate and Rhythm: Tachycardia present. Rhythm  irregular.  Pulmonary:     Effort: No respiratory distress.     Breath sounds: Normal breath sounds.  Abdominal:     General: Bowel sounds are normal. There is no distension.     Palpations: Abdomen is soft.     Tenderness: There is abdominal tenderness.  Skin:    General: Skin is warm and dry.  Neurological:     Mental Status: She is alert.     Labs: Recent Labs    03/19/24 0810 03/20/24 0743 03/21/24 0408  WBC 12.9* 13.1* 10.0  HGB 13.9 12.2 11.2*  HCT 41.5 36.5 34.0*  PLT 252 260 251   BMET Recent Labs    03/19/24 0810 03/20/24 0743 03/21/24 0408  NA 136 139 137  K 3.0* 3.6 3.3*  CL 100 106 105  CO2 21* 20* 24  GLUCOSE 256* 156* 124*  BUN 47* 37* 32*  CREATININE 1.53* 1.27* 1.18*  CALCIUM  8.3* 7.8* 7.8*   LFT Recent Labs    03/19/24 0810  PROT 5.7*  ALBUMIN 2.6*  AST 17  ALT 13  ALKPHOS 27*  BILITOT 0.6   PT/INR No results for input(s): LABPROT, INR in the last 72 hours. Diagnostic imaging: DG UGI W SMALL BOWEL Result Date: 03/20/2024 CLINICAL DATA:  369840 History of Roux-en-Y gastric bypass 369840 History of Roux-en-Y bypass. Admitted in the hospital secondary to abdominal pain, CT abdomen showing dilated segment of small bowel involving the afferent jejunal anastomoses. Consult for UGI/SBFT for further evaluation. EXAM: DG UGI W/ SMALL BOWEL TECHNIQUE: Single contrast examination was then performed using thin liquid barium. This exam was performed by Kimble Clas, PA-C, and was supervised and interpreted by Dr. Hughes. FLUOROSCOPY: Radiation Exposure Index (as provided by the fluoroscopic device): 51.0 mGy Kerma COMPARISON:  Small-bowel follow-through, 11/04/2010. CT AP, 03/18/2024 and 04/30/2014. FINDINGS: Esophagus:  Normal appearance. Esophageal motility:  Within normal limits. Gastroesophageal reflux:  None visualized. Ingested 13 mm barium tablet: Not given Stomach: Roux-en-Y bypass changes.  No gross abnormality Gastric emptying: Normal.  Jejunum: Small focal area of bowel dilation of the jejunal anastomosis, similar to that on CT. Does not impede contrast passage. No indication of focal narrowing/stricture/mass. No contrast extravasation. Other:  None. IMPRESSION: 1. Surgical changes of Roux-en-Y gastric bypass changes. Otherwise, normal upper GI and small-bowel follow-through. 2. No overt dilatation at the enteral anastomosis within the lower abdomen. No fluoroscopic evidence of obstruction, stenosis or stricture. Electronically Signed   By: Thom Hughes M.D.   On: 03/20/2024 14:44   ECHOCARDIOGRAM COMPLETE Result Date: 03/19/2024    ECHOCARDIOGRAM REPORT   Patient Name:   Sharon Lowe Date of Exam: 03/19/2024 Medical Rec #:  [id number]     Height:       62.0 in Accession #:    [id number]    Weight:       159.2 lb Date of Birth:  24-Nov-1945     BSA:          1.735 m Patient Age:    78 years      BP:           162/95 mmHg Patient Gender: F             HR:           68 bpm. Exam Location:  Inpatient Procedure: 2D Echo (Both Spectral and Color Flow Doppler were utilized during            procedure). Indications:    congestive heart failure  History:        Patient has prior history of Echocardiogram examinations, most                 recent 04/02/2014. Chronic kidney disease; Risk Factors:Diabetes,                 Hypertension and Current Smoker.  Sonographer:    Tinnie Barefoot RDCS Referring Phys: [id number] CHRISTOPHER P DANFORD  Sonographer Comments: Suboptimal subcostal window and patient is obese. Image acquisition challenging due to respiratory motion, patient in pain with minimal pressure. and Image acquisition challenging due to patient body habitus. IMPRESSIONS  1. Left ventricular ejection fraction, by estimation, is 60 to 65%. The left ventricle has normal function.  The left ventricle has no regional wall motion abnormalities. Left ventricular diastolic parameters are consistent with Grade I diastolic dysfunction (impaired relaxation).   2. Right ventricular systolic function is moderately reduced. The right ventricular size is moderately enlarged.  3. Right atrial size was mildly dilated.  4. The mitral valve is abnormal. No evidence of mitral valve regurgitation. No evidence of mitral stenosis.  5. The aortic valve is normal in structure. Aortic valve regurgitation is not visualized. No aortic stenosis is present.  6. The inferior vena cava is normal in size with greater than 50% respiratory variability, suggesting right atrial pressure of 3 mmHg. FINDINGS  Left Ventricle: Left ventricular ejection fraction, by estimation, is 60 to 65%. The left ventricle has normal function. The left ventricle has no regional wall motion abnormalities. Strain was performed and the global longitudinal strain is indeterminate. The left ventricular internal cavity size was normal in size. There is no left ventricular hypertrophy. Left ventricular diastolic parameters are consistent with Grade I diastolic dysfunction (impaired relaxation). Right Ventricle: The right ventricular size is moderately enlarged. No increase in right ventricular wall thickness. Right ventricular systolic function is moderately reduced. Left Atrium: Left atrial size was normal in size. Right Atrium: Right atrial size was mildly dilated. Pericardium: There is no evidence of pericardial effusion. Mitral Valve: The mitral valve is abnormal. There is mild thickening of the mitral valve leaflet(s). There is mild calcification of the mitral valve leaflet(s). Mild mitral annular calcification. No evidence of mitral valve regurgitation. No evidence of mitral valve stenosis. Tricuspid Valve: The tricuspid valve is normal in structure. Tricuspid valve regurgitation is not demonstrated. No evidence of tricuspid stenosis. Aortic Valve: The aortic valve is normal in structure. Aortic valve regurgitation is not visualized. No aortic stenosis is present. Pulmonic Valve: The pulmonic valve was normal in  structure. Pulmonic valve regurgitation is not visualized. No evidence of pulmonic stenosis. Aorta: The aortic root is normal in size and structure. Venous: The inferior vena cava is normal in size with greater than 50% respiratory variability, suggesting right atrial pressure of 3 mmHg. IAS/Shunts: The interatrial septum was not well visualized. Additional Comments: 3D was performed not requiring image post processing on an independent workstation and was indeterminate.  LEFT VENTRICLE PLAX 2D LVIDd:         4.00 cm LVIDs:         2.90 cm LVOT diam:     2.20 cm LV SV:         82 LV SV Index:   47 LVOT Area:     3.80 cm  RIGHT VENTRICLE RV Basal diam:  2.40 cm LEFT ATRIUM           Index        RIGHT ATRIUM           Index LA diam:      3.50 cm 2.02 cm/m   RA Area:     12.90 cm LA Vol (A4C): 63.8 ml 36.77 ml/m  RA Volume:   27.40 ml  15.79 ml/m  AORTIC VALVE LVOT Vmax:   127.33 cm/s LVOT Vmean:  77.133 cm/s LVOT VTI:    0.216 m  AORTA Ao Root diam: 3.30 cm Ao Asc diam:  3.10 cm  SHUNTS Systemic VTI:  0.22 m Systemic Diam: 2.20 cm Maude Emmer MD Electronically signed by Maude Emmer MD Signature Date/Time: 03/19/2024/4:03:49 PM    Final    IMPRESSION: Intractable abdominal pain, LUQ, intermittent, painful to palpation diffusely on abdomen  today Abnormal CT imaging, small bowel wall thickening of afferent limb History Roux-en-Y gastric bypass Cholelithiasis and gallbladder sludge Intractable back pain  PLAN: -Small bowel follow through largely unremarkable -Patient is not interested in any testing where she needs to move/lay in such a way that will irritate her back, additionally, unlikely to be able to complete HIDA scan given her need for pain medications -Discussed EGD with patient and her daughter at bedside today, discussed benefits, alternatives and risks of bleeding/infection/perforation/anesthesia, she verbalized understanding and elected to proceed -Start empiric IV PPI therapy  -Maintain  NPO  -Further recommendations to follow pending procedure today   LOS: 2 days   Estefana Keas, [facility]

## 2024-03-21 NOTE — Transfer of Care (Signed)
 Immediate Anesthesia Transfer of Care Note  Patient: Sharon Lowe  Procedure(s) Performed: EGD (ESOPHAGOGASTRODUODENOSCOPY)  Patient Location: PACU  Anesthesia Type:MAC  Level of Consciousness: awake and alert   Airway & Oxygen Therapy: Patient Spontanous Breathing and Patient connected to face mask oxygen  Post-op Assessment: Report given to RN and Post -op Vital signs reviewed and stable  Post vital signs: Reviewed and stable  Last Vitals:  Vitals Value Taken Time  BP 118/52 03/21/24 11:06  Temp    Pulse 82 03/21/24 11:07  Resp 16 03/21/24 11:06  SpO2 100 % 03/21/24 11:07  Vitals shown include unfiled device data.  Last Pain:  Vitals:   03/21/24 1106  TempSrc:   PainSc: Asleep      Patients Stated Pain Goal: 3 (03/20/24 1949)  Complications: No notable events documented.

## 2024-03-21 NOTE — Progress Notes (Signed)
 Patient back to unit from EDG.

## 2024-03-21 NOTE — Progress Notes (Signed)
 Progress Note   Patient: Sharon Lowe FMW:[id number] DOB: 1945-11-08 DOA: 03/18/2024     2 DOS: the patient was seen and examined on 03/21/2024 at 9:20 AM      Brief hospital course: 78 y.o. F with obesity, hx gastric bypass, HTN, HLD, DM, CKD IIIb baseline 1.3-1.4 as recently as August, and recent treatment with Lasix for leg swelling who presents with upper abdominal pain, N/V, general weakness.     In the ER, Cr 2.35, WBC 14.1, and lipase 68. CT showed wall-thickening of afferent limb of gastric bypass, gallstones.   Admitted for AKI, abdominal pain.  Abdominal pain worsened, Gen Surg and GI consulted.      Assessment and Plan: Marginal ulcer Patient was admitted for abdominal discomfort, which she initially described as worse after eating.  She underwent CT that showed gallstones only as well as thickening of the afferent limb of her gastric bypass, which was not confirmed on upper GI series.  GI and Gen surg consulted for possible cholecystitis.    Ultimately, cholecystitis ruled out clinically on exam, but she continued to describe vague upper abdominal pain, so GI took her for endoscopy 10/21 that showed a large 2 cm marginal ulcer. - Clear liquid diet - Continue IV PPI - Start sucralfate - Start Pepcid - Obtain H. pylori stool antigen    Back pain This was present on admission, CT ruled out compression fracture.  In truth her back pain has become her primary concern in the hospital, although she is refusing MRI due to inabiltiy to lie flat.    I presume that her back pain is musculoskeletal or referred pain from her ulcer, although I think this warrants close monitoring and would have a low threshold to obtain MRI thoracic spine if she would allow. - Continue oxycodone  - PT eval   AKI on CKDIIIb Presented with creatinine 2.3.  Treatd with fluids and improved to baseline. Dispense report implies she takes both Lasix and hydrochlorothiazide . (former given for 1 month  only by Nephrology in Sept) -Hold Lasix , telmisartan - Resume hydrochlorothiazide     Weight loss with diuresis Leg swelling Referred to [facility] Dr. Windle in September, due to woresning CKD up to Cr 1.5 and severe leg swelling.  Treatd with Lasix and diuresed 20+ lbs.  Echo here unremarkable   Sinus arrhythmia No atrial fibrillation   Hypertension Blood pressure increased - Continue metoprlol - Resume HCTZ - Hold  Lasix, Telmisartan  - As needed hydralazine  for severe pressures   Hyperlipidemia -Hold Lipitor   Diabetes Glucose high normal -Continue sliding scale correction insulin  - Hold liraglutide  and metformin    Hypothyroidism -Continue levothyroxine   Hypokalemia - Supplement potassium       Subjective: Went to EGD today with GI, found to have ulcer.  She has had no fever, respiratory distress.  She is mostly concerned about her back pain.     Physical Exam: BP (!) 148/63 (BP Location: Right Arm)   Pulse 62   Temp 98.2 F (36.8 C) (Oral)   Resp 15   Ht 5' 2 (1.575 m)   Wt 72.2 kg   SpO2 98%   BMI 29.11 kg/m   Elderly adult female, lying in bed, interactive and appropriate RRR, no murmurs, no peripheral edema Respiratory rate normal, lungs clear without rales or wheezes, overall diminished due to pain Abdomen soft, no masses appreciated.  She has marked tenderness throughout.  She is guarding, she has no rigidity or rebound Attention normal, affect  blunted by pain, appears quite uncomfortable, upper extremity strength normal, face symmetric     Data Reviewed: Discussed with GI and general surgery Basic metabolic panel shows creatinine stable, potassium slightly low CBC shows mild leukocytosis     Disposition: 78 yo F with AKI and abdominal pain.  Post admission, back pain has become more severe and I suspect will limit her diacharge to home.    The abdominal pain was found to be from a gastric ulcer.  AKI  rsolved          Author: Lonni SHAUNNA Dalton, MD 03/21/2024 7:05 PM  For on call review [email or web address].

## 2024-03-22 ENCOUNTER — Encounter (HOSPITAL_COMMUNITY): Payer: Self-pay | Admitting: Internal Medicine

## 2024-03-22 DIAGNOSIS — N170 Acute kidney failure with tubular necrosis: Secondary | ICD-10-CM | POA: Diagnosis not present

## 2024-03-22 DIAGNOSIS — N1832 Chronic kidney disease, stage 3b: Secondary | ICD-10-CM | POA: Diagnosis not present

## 2024-03-22 LAB — COMPREHENSIVE METABOLIC PANEL WITH GFR
ALT: 14 U/L (ref 0–44)
AST: 18 U/L (ref 15–41)
Albumin: 2.2 g/dL — ABNORMAL LOW (ref 3.5–5.0)
Alkaline Phosphatase: 31 U/L — ABNORMAL LOW (ref 38–126)
Anion gap: 8 (ref 5–15)
BUN: 23 mg/dL (ref 8–23)
CO2: 22 mmol/L (ref 22–32)
Calcium: 8.1 mg/dL — ABNORMAL LOW (ref 8.9–10.3)
Chloride: 107 mmol/L (ref 98–111)
Creatinine, Ser: 1.07 mg/dL — ABNORMAL HIGH (ref 0.44–1.00)
GFR, Estimated: 53 mL/min — ABNORMAL LOW (ref >60)
Glucose, Bld: 126 mg/dL — ABNORMAL HIGH (ref 70–99)
Potassium: 3.6 mmol/L (ref 3.5–5.1)
Sodium: 137 mmol/L (ref 135–145)
Total Bilirubin: 0.7 mg/dL (ref 0.0–1.2)
Total Protein: 5.2 g/dL — ABNORMAL LOW (ref 6.5–8.1)

## 2024-03-22 LAB — GLUCOSE, CAPILLARY
Glucose-Capillary: 151 mg/dL — ABNORMAL HIGH (ref 70–99)
Glucose-Capillary: 117 mg/dL — ABNORMAL HIGH (ref 70–99)
Glucose-Capillary: 188 mg/dL — ABNORMAL HIGH (ref 70–99)
Glucose-Capillary: 208 mg/dL — ABNORMAL HIGH (ref 70–99)

## 2024-03-22 LAB — CBC
HCT: 34.5 % — ABNORMAL LOW (ref 36.0–46.0)
Hemoglobin: 11.4 g/dL — ABNORMAL LOW (ref 12.0–15.0)
MCH: 32.9 pg (ref 26.0–34.0)
MCHC: 33 g/dL (ref 30.0–36.0)
MCV: 99.7 fL (ref 80.0–100.0)
Platelets: 272 K/uL (ref 150–400)
RBC: 3.46 MIL/uL — ABNORMAL LOW (ref 3.87–5.11)
RDW: 13.4 % (ref 11.5–15.5)
WBC: 8.8 K/uL (ref 4.0–10.5)
nRBC: 0 % (ref 0.0–0.2)

## 2024-03-22 NOTE — Progress Notes (Signed)
 [facility] Progress Note  SUBJECTIVE:   Interval history: Sharon Lowe was seen and evaluated today at bedside. Resting comfortably in bed with loved one Sharon Lowe) at bedside. She appears in much better spirits today. She noted that her back pain is improved. She is having mild LUQ pain.   Past Medical History:  Diagnosis Date   Arthritis    Bilateral ureteral calculi    Depression    mild   GERD (gastroesophageal reflux disease)    Heart murmur    asymptomatic   History of kidney stones 2015   History of sleep apnea    hx of prior to gastric bypass, no problems since   History of TIA (transient ischemic attack)    ~2000-- no residual   Hyperlipidemia    Hypertension    Hypothyroidism    Pneumonia    hx of as a child   Type 2 diabetes mellitus (HCC)    Urgency of urination    Wears contact lenses    Past Surgical History:  Procedure Laterality Date   CARPAL TUNNEL RELEASE Right    CATARACT EXTRACTION Bilateral 2015   CYSTOSCOPY W/ URETERAL STENT PLACEMENT Bilateral 12/05/2013   Procedure: CYSTOSCOPY WITH RETROGRADE PYELOGRAM/URETERAL STENT PLACEMENT;  Surgeon: Morene LELON Salines, MD;  Location: [facility];  Service: Urology;  Laterality: Bilateral;   CYSTOSCOPY WITH URETEROSCOPY AND STENT PLACEMENT Bilateral 12/20/2013   Procedure: CYSTOSCOPY WITH BILATERAL URETEROSCOPY, LASER LITHOTRIPSY AND STENT EXCHANGE;  Surgeon: Morene LELON Salines, MD;  Location: [facility];  Service: Urology;  Laterality: Bilateral;   EXPLORATOMY LAPAROTOMY FOR ABD. ABSCESS FROM MIGRATION OF IUD  1977   HOLMIUM LASER APPLICATION Bilateral 12/20/2013   Procedure: HOLMIUM LASER APPLICATION;  Surgeon: Morene LELON Salines, MD;  Location: [facility];  Service: Urology;  Laterality: Bilateral;   KNEE ARTHROSCOPY Right    LAPAROSCOPIC GASTRIC BYPASS  08-18-2010   AND REPAIR VENTRAL HERNIA   REVISION TOTAL KNEE ARTHROPLASTY Left 01-05-2005   SHOULDER OPEN ROTATOR CUFF  REPAIR Left 07-12-2002   TONSILLECTOMY  CHILD   TOTAL ABDOMINAL HYSTERECTOMY W/ BILATERAL SALPINGOOPHORECTOMY  ~1995   TOTAL KNEE ARTHROPLASTY Left 11-17-2002   TOTAL KNEE ARTHROPLASTY Right 12/10/2014   Procedure: TOTAL KNEE ARTHROPLASTY;  Surgeon: Donnice Car, MD;  Location: [facility];  Service: Orthopedics;  Laterality: Right;   TOTAL KNEE REVISION WITH SCAR DEBRIDEMENT/PATELLA REVISION WITH POLY EXCHANGE Left 10/30/2013   Procedure: LEFT  TOTAL  KNEE  REVISION;  Surgeon: Donnice JONETTA Car, MD;  Location: [facility];  Service: Orthopedics;  Laterality: Left;   Current Facility-Administered Medications  Medication Dose Route Frequency Provider Last Rate Last Admin   acetaminophen  (TYLENOL ) tablet 650 mg  650 mg Oral Q6H PRN Kriss Estefana DEL, DO   650 mg at 03/22/24 9178   Or   acetaminophen  (TYLENOL ) suppository 650 mg  650 mg Rectal Q6H PRN Kriss Estefana DEL, DO       enoxaparin  (LOVENOX ) injection 40 mg  40 mg Subcutaneous Q24H Kriss Estefana H, DO   40 mg at 03/22/24 0821   famotidine (PEPCID) tablet 20 mg  20 mg Oral Daily Alexine Pilant H, DO   20 mg at 03/22/24 9178   feeding supplement (BOOST / RESOURCE BREEZE) liquid 1 Container  1 Container Oral TID BM Kriss Estefana DEL, DO   1 Container at 03/20/24 1100   hydrALAZINE  (APRESOLINE ) tablet 25 mg  25 mg Oral Q8H PRN Kriss Estefana DEL, DO  hydrochlorothiazide  (HYDRODIURIL ) tablet 25 mg  25 mg Oral Daily Danford, Christopher P, MD   25 mg at 03/22/24 9166   Influenza vac split trivalent PF (FLUZONE  HIGH-DOSE) injection 0.5 mL  0.5 mL Intramuscular Tomorrow-1000 Danford, Lonni SQUIBB, MD       insulin  aspart (novoLOG ) injection 0-15 Units  0-15 Units Subcutaneous TID WC Kriss Estefana DEL, DO   3 Units at 03/22/24 9177   levothyroxine  (SYNTHROID ) tablet 88 mcg  88 mcg Oral Q0600 Kriss Estefana DEL, DO   88 mcg at 03/22/24 9363   metoprolol  succinate (TOPROL -XL) 24 hr tablet 25 mg  25 mg Oral Daily Boris Engelmann H, DO   25 mg at  03/22/24 9177   metroNIDAZOLE (FLAGYL) IVPB 500 mg  500 mg Intravenous Q12H Kriss Estefana DEL, DO 100 mL/hr at 03/22/24 0840 500 mg at 03/22/24 0840   oxyCODONE -acetaminophen  (PERCOCET/ROXICET) 5-325 MG per tablet 1 tablet  1 tablet Oral Q6H PRN Kriss Estefana DEL, DO   1 tablet at 03/22/24 9556   And   oxyCODONE  (Oxy IR/ROXICODONE ) immediate release tablet 5 mg  5 mg Oral Q6H PRN Kriss Estefana H, DO   5 mg at 03/22/24 0443   pantoprazole  (PROTONIX ) injection 40 mg  40 mg Intravenous Q12H Kriss Estefana H, DO   40 mg at 03/22/24 9177   polyethylene glycol (MIRALAX  / GLYCOLAX ) packet 17 g  17 g Oral Daily Jonel Lonni SQUIBB, MD   17 g at 03/22/24 9177   prochlorperazine (COMPAZINE) injection 5 mg  5 mg Intravenous Q6H PRN Kriss Estefana H, DO       sucralfate (CARAFATE) 1 GM/10ML suspension 1 g  1 g Oral TID WC & HS Kriss Estefana H, DO   1 g at 03/22/24 9178   Allergies as of 03/18/2024 - Review Complete 03/18/2024  Allergen Reaction Noted   Empagliflozin Other (See Comments) 03/18/2024   Sulfa antibiotics Itching, Rash, and Dermatitis 01/07/2011   Review of Systems:  Review of Systems  Gastrointestinal:  Positive for abdominal pain.  Musculoskeletal:  Positive for back pain.    OBJECTIVE:   Temp:  [97.6 F (36.4 C)-98.5 F (36.9 C)] 98 F (36.7 C) (10/22 0821) Pulse Rate:  [56-88] 74 (10/22 0821) Resp:  [13-25] 18 (10/22 0821) BP: (102-150)/(44-64) 150/64 (10/22 0821) SpO2:  [95 %-100 %] 99 % (10/22 0821) Last BM Date : 03/22/24 Physical Exam Constitutional:      General: She is not in acute distress.    Appearance: She is not ill-appearing, toxic-appearing or diaphoretic.  Cardiovascular:     Rate and Rhythm: Normal rate and regular rhythm.  Pulmonary:     Effort: No respiratory distress.     Breath sounds: Normal breath sounds.  Abdominal:     General: Bowel sounds are normal. There is no distension.     Palpations: Abdomen is soft.     Tenderness: There is  abdominal tenderness.  Neurological:     Mental Status: She is alert.     Labs: Recent Labs    03/20/24 0743 03/21/24 0408 03/22/24 0426  WBC 13.1* 10.0 8.8  HGB 12.2 11.2* 11.4*  HCT 36.5 34.0* 34.5*  PLT 260 251 272   BMET Recent Labs    03/20/24 0743 03/21/24 0408 03/22/24 0426  NA 139 137 137  K 3.6 3.3* 3.6  CL 106 105 107  CO2 20* 24 22  GLUCOSE 156* 124* 126*  BUN 37* 32* 23  CREATININE 1.27* 1.18* 1.07*  CALCIUM  7.8* 7.8*  8.1*   LFT Recent Labs    03/22/24 0426  PROT 5.2*  ALBUMIN 2.2*  AST 18  ALT 14  ALKPHOS 31*  BILITOT 0.7   PT/INR No results for input(s): LABPROT, INR in the last 72 hours. Diagnostic imaging: No results found.  IMPRESSION: Gastrojejunal anastomotic ulcer x 2 on EGD 03/21/24 Abdominal pain, improved, from above Abnormal CT imaging, small bowel wall thickening of afferent limb History Roux-en-Y gastric bypass Cholelithiasis and gallbladder sludge Intractable back pain, improved   PLAN: -Continue IV PPI during inpatient, change to oral PPI therapy BID on discharge x 2 months -Sucralfate suspension QID x 14 days -H.Pylori stool antigen testing -Ok for diet as tolerated, she may be most comfortable with soft diet given findings of ulceration on EGD -Trend H/H, transfuse for Hgb < 7  -[facility] will follow   LOS: 3 days   Estefana Keas, [facility]

## 2024-03-22 NOTE — Progress Notes (Signed)
  Progress Note Patient: Sharon Lowe FMW:[id number] DOB: 1945/11/17 DOA: 03/18/2024     3 DOS: the patient was seen and examined on 03/22/2024 at 9:20 AM   Brief hospital course: Sharon Lowe is a 78 y.o. F with obesity, hx gastric bypass, HTN, HLD, DM, CKD IIIb baseline 1.3-1.4 as recently as August, and recent treatment with Lasix for leg swelling who presents with upper abdominal pain, N/V, general weakness.      In the ER, Cr 2.35, WBC 14.1, and lipase 68. CT showed wall-thickening of afferent limb of gastric bypass, gallstones.   Admitted for AKI, abdominal pain. Found to have gastric ulcer on EGD.   Assessment and Plan: Marginal ulcer- CT showed gallstones and thickening of the afferent limb of her gastric bypass, which was not confirmed on upper GI series.  GI and Gen surg consulted for possible cholecystitis.   Ultimately, cholecystitis ruled out clinically on exam, but she continued to describe vague upper abdominal pain, so GI took her for endoscopy 10/21 that showed a large 2 cm marginal ulcer. No active bleeding - advancing diet as tolerated - Continue IV PPI - continue sucralfate - f/u H. pylori stool antigen - avoid NSAIDs   Back pain- likely referred from ulcer. Much improved today. - PT eval  AKI on CKDIIIb- Presented with creatinine 2.3. Treated with fluids and improved to baseline. takes both Lasix and hydrochlorothiazide . (former given for 1 month only by Nephrology in Sept) -Hold Lasix , telmisartan - Resume hydrochlorothiazide     Weight loss with diuresis Leg swelling Referred to [facility] Dr. Windle in September, due to woresning CKD up to Cr 1.5 and severe leg swelling.  Treatd with Lasix and diuresed 20+ lbs.  Echo here unremarkable   Sinus arrhythmia No atrial fibrillation   Hypertension Blood pressure increased - Continue metoprlol - Resume HCTZ - Hold  Lasix, Telmisartan  - As needed hydralazine  for severe pressures   Hyperlipidemia -Hold Lipitor    Diabetes Glucose high normal -Continue sliding scale correction insulin  - Hold liraglutide  and metformin    Hypothyroidism -Continue levothyroxine   Hypokalemia - Supplement potassium    Subjective: pt reports feeling much better today. Pain has improved. Tolerating her liquid diet.   Physical Exam: BP (!) 142/64 (BP Location: Right Arm)   Pulse 88   Temp 98.5 F (36.9 C) (Oral)   Resp 17   Ht 5' 2 (1.575 m)   Wt 72.2 kg   SpO2 97%   BMI 29.11 kg/m   Elderly adult female, lying in bed, interactive and appropriate RRR, no murmurs, no peripheral edema Respiratory rate normal, lungs clear without rales or wheezes, overall diminished due to pain Abdomen soft, no masses appreciated. Negative tenderness.  Attention normal, affect blunted by pain, appears quite uncomfortable, upper extremity strength normal, face symmetric  Data Reviewed: Discussed with GI and general surgery Basic metabolic panel shows creatinine stable     Author: Marien LITTIE Piety, MD 03/22/2024 8:04 AM  For on call review [email or web address].

## 2024-03-22 NOTE — Anesthesia Postprocedure Evaluation (Signed)
 Anesthesia Post Note  Patient: Sharon Lowe  Procedure(s) Performed: EGD (ESOPHAGOGASTRODUODENOSCOPY)     Patient location during evaluation: Endoscopy Anesthesia Type: MAC Level of consciousness: awake and alert Pain management: pain level controlled Vital Signs Assessment: post-procedure vital signs reviewed and stable Respiratory status: spontaneous breathing, nonlabored ventilation, respiratory function stable and patient connected to nasal cannula oxygen Cardiovascular status: blood pressure returned to baseline and stable Postop Assessment: no apparent nausea or vomiting Anesthetic complications: no   No notable events documented.  Last Vitals:  Vitals:   03/22/24 0436 03/22/24 0821  BP: (!) 142/64 (!) 150/64  Pulse: 88 74  Resp: 17 18  Temp: 36.9 C 36.7 C  SpO2: 97% 99%    Last Pain:  Vitals:   03/22/24 0821  TempSrc: Oral  PainSc:                  Kennedey Digilio L Vianka Ertel

## 2024-03-22 NOTE — TOC Initial Note (Signed)
 Transition of Care [facility]) - Initial/Assessment Note    Patient Details  Name: Sharon Lowe MRN: [id number] Date of Birth: 1946/04/23  Transition of Care [facility]) CM/SW Contact:    Jeoffrey LITTIE Moose, [place] Phone Number: 03/22/2024, 8:50 AM  Clinical Narrative:                 Pt admitted from home due to epigastric pain. No current TOC needs, please consult as needs arise  following therapy eval.        Patient Goals and CMS Choice            Expected Discharge Plan and Services                                              Prior Living Arrangements/Services                       Activities of Daily Living   ADL Screening (condition at time of admission) Independently performs ADLs?: Yes (appropriate for developmental age) Is the patient deaf or have difficulty hearing?: No Does the patient have difficulty seeing, even when wearing glasses/contacts?: No Does the patient have difficulty concentrating, remembering, or making decisions?: No  Permission Sought/Granted                  Emotional Assessment              Admission diagnosis:  Acute cystitis without hematuria [N30.00] AKI (acute kidney injury) [N17.9] Acute renal failure superimposed on stage 3b chronic kidney disease (HCC) [N17.9, N18.32] Patient Active Problem List   Diagnosis Date Noted   AKI (acute kidney injury) 03/19/2024   Acute renal failure superimposed on stage 3b chronic kidney disease (HCC) 03/18/2024   Obese 12/12/2014   S/P right TKA 12/10/2014   S/P knee replacement 12/10/2014   Osteopenia 12/20/2013   Biliary calculi 12/20/2013   Clinical depression 12/20/2013   Adult hypothyroidism 12/20/2013   Lower urinary tract infectious disease 12/05/2013   Sepsis (HCC) 12/05/2013   Acute pyelonephritis: Probable 12/05/2013   Acute bilateral obstructive uropathy 12/05/2013   ARF (acute renal failure) 12/05/2013   Recurrent nephrolithiasis 12/05/2013   Expected blood  loss anemia 10/31/2013   Overweight (BMI 25.0-29.9) 10/31/2013   Hyponatremia 10/31/2013   S/P left knee revision 10/30/2013   Chest pain 10/22/2013   Tobacco abuse 10/22/2013   Other and unspecified hyperlipidemia 10/22/2013   Leukocytosis 10/22/2013   Adrenal hyperplasia 12/28/2012   Elevated cortisol level 12/28/2012   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 [place] [address] Phone: [phone] Fax: [phone]     Social Drivers of Health (SDOH) Social History: SDOH Screenings   Food Insecurity: No Food Insecurity (03/18/2024)  Housing: Low Risk  (03/18/2024)  Transportation Needs: No Transportation Needs (03/18/2024)  Utilities: Not At Risk (03/18/2024)  Social Connections: Socially Isolated (03/18/2024)  Tobacco Use: High Risk (03/18/2024)   SDOH Interventions:     Readmission Risk Interventions     No data to display

## 2024-03-22 NOTE — Plan of Care (Addendum)
 Patient refuses sacrum dressing.  Educated her on repositioning to avoid pressure injury, she reports she understands.  Foam dressing on mid back intact.  PT advanced from clear liquid to soft diet, tolerating well. No n/v reported.  Abdominal pain has improved greatly.  Problem: Education: Goal: Ability to describe self-care measures that may prevent or decrease complications (Diabetes Survival Skills Education) will improve Outcome: Progressing Goal: Individualized Educational Video(s) Outcome: Progressing   Problem: Coping: Goal: Ability to adjust to condition or change in health will improve Outcome: Progressing   Problem: Health Behavior/Discharge Planning: Goal: Ability to identify and utilize available resources and services will improve Outcome: Progressing Goal: Ability to manage health-related needs will improve Outcome: Progressing

## 2024-03-22 NOTE — Evaluation (Signed)
 Physical Therapy Evaluation Patient Details Name: Sharon Lowe MRN: [id number] DOB: 02/08/1946 Today's Date: 03/22/2024  History of Present Illness  Pt is a 78 y.o. female [facility] ED [facility]/18/24 with several days of vomiting, epigastric pain, and generalized abdominal tenderness. Work-up revealed AKI. CT showed gallstones and thickening of the afferent limb of her gastric bypass. Pt underwent EGD 10/21 which demonstrated marginal ulceration. PMHx: CKD IIIb, DM, HTN, HLD, and obesity s/p gastric bypass (2012 by Dr. Adina Lunger).   Clinical Impression  Pt admitted with above diagnosis. PTA, pt was modI for functional mobility using a rollator, independent with ADLs/IADLs, and driving. She lives alone in an apartment with a ramped entrance. Pt currently with functional limitations due to the deficits listed below (see PT Problem List). She required CGA-minA for bed mobility, supervision for sit<>stand using RW, and CGA for gait using RW. Pt is currently limited by pain, generalized LE weakness, decreased balance, and reduced activity tolerance. Pt will benefit from acute skilled PT to increase her independence and safety with mobility to allow discharge. Recommend HHPT to increase strength, improve balance, decrease fall risk, advance activity tolerance, and optimize safety within the home environment.     If plan is discharge home, recommend the following: A little help with walking and/or transfers;A little help with bathing/dressing/bathroom;Assistance with cooking/housework;Assist for transportation;Supervision due to cognitive status   Can travel by private vehicle        Equipment Recommendations None recommended by PT  Recommendations for Other Services       Functional Status Assessment Patient has had a recent decline in their functional status and demonstrates the ability to make significant improvements in function in a reasonable and predictable amount of time.      Precautions / Restrictions Precautions Precautions: Fall;Other (comment) (Abdominal) Recall of Precautions/Restrictions: Intact Restrictions Weight Bearing Restrictions Per Provider Order: No      Mobility  Bed Mobility Overal bed mobility: Needs Assistance Bed Mobility: Supine to Sit, Sit to Supine     Supine to sit: Contact guard, HOB elevated, Used rails Sit to supine: Min assist, HOB elevated, Used rails   General bed mobility comments: Pt sat up on R side of bed with increased time. Cues for sequencing. Assist to manage BLE and elevate trunk. Returnign to bed pt required assist to elevate legs.    Transfers Overall transfer level: Needs assistance Equipment used: Rolling walker (2 wheels) Transfers: Sit to/from Stand Sit to Stand: Supervision           General transfer comment: Pt stood from lowest bed height. Demonstrated proper hand palcement using RW. Powered up without assist. Good eccentric control.    Ambulation/Gait Ambulation/Gait assistance: Contact guard assist Gait Distance (Feet): 100 Feet Assistive device: Rolling walker (2 wheels) Gait Pattern/deviations: Step-through pattern, Decreased stride length Gait velocity: decreased Gait velocity interpretation: <1.8 ft/sec, indicate of risk for recurrent falls   General Gait Details: Pt ambulated with short slow steps with even weight shift and good foot clearance. She navigated room/hallway well, maintaining upright posture and body inside RW.  Stairs            Wheelchair Mobility     Tilt Bed    Modified Rankin (Stroke Patients Only)       Balance Overall balance assessment: Needs assistance Sitting-balance support: Bilateral upper extremity supported, Feet supported Sitting balance-Leahy Scale: Fair Sitting balance - Comments: Pt sat EOB with supervision. She required assist to don/doff shoes d/t abdominal pain.  Standing balance support: Bilateral upper extremity supported, During  functional activity, Reliant on assistive device for balance Standing balance-Leahy Scale: Poor Standing balance comment: Pt dependent on RW                             Pertinent Vitals/Pain Pain Assessment Pain Assessment: 0-10 Pain Score: 4  Pain Location: Abdomen Pain Descriptors / Indicators: Discomfort, Sore Pain Intervention(s): Monitored during session, Limited activity within patient's tolerance, Repositioned    Home Living Family/patient expects to be discharged to:: Private residence Living Arrangements: Alone Available Help at Discharge: Neighbor;Available PRN/intermittently Type of Home: Apartment Home Access: Ramped entrance       Home Layout: One level Home Equipment: Rollator (4 wheels);Toilet riser;Rolling Walker (2 wheels);Grab bars - tub/shower;Shower seat;Hand held shower head Additional Comments: Pt reports she sleeps in a recliner chair at baseline.    Prior Function Prior Level of Function : Independent/Modified Independent             Mobility Comments: Ambulates using walls/furniture walking, but primarily uses rollator. Denies fall hx. ADLs Comments: Indep with ADLs. Manages her medications and makes meals. Drives. Retired.     Extremity/Trunk Assessment   Upper Extremity Assessment Upper Extremity Assessment: Defer to OT evaluation    Lower Extremity Assessment Lower Extremity Assessment: Generalized weakness    Cervical / Trunk Assessment Cervical / Trunk Assessment: Kyphotic  Communication   Communication Communication: No apparent difficulties    Cognition Arousal: Alert Behavior During Therapy: WFL for tasks assessed/performed   PT - Cognitive impairments: No apparent impairments                       PT - Cognition Comments: Pt A,Ox4 Following commands: Intact       Cueing Cueing Techniques: Verbal cues     General Comments General comments (skin integrity, edema, etc.): Pt with BLE +2 pitting  edema    Exercises     Assessment/Plan    PT Assessment Patient needs continued PT services  PT Problem List Decreased strength;Decreased activity tolerance;Decreased balance;Decreased mobility;Pain       PT Treatment Interventions DME instruction;Gait training;Functional mobility training;Therapeutic activities;Therapeutic exercise;Balance training;Patient/family education    PT Goals (Current goals can be found in the Care Plan section)  Acute Rehab PT Goals Patient Stated Goal: Return Home PT Goal Formulation: With patient Time For Goal Achievement: 04/05/24 Potential to Achieve Goals: Good    Frequency Min 2X/week     Co-evaluation               AM-PAC PT 6 Clicks Mobility  Outcome Measure Help needed turning from your back to your side while in a flat bed without using bedrails?: A Little Help needed moving from lying on your back to sitting on the side of a flat bed without using bedrails?: A Little Help needed moving to and from a bed to a chair (including a wheelchair)?: A Little Help needed standing up from a chair using your arms (e.g., wheelchair or bedside chair)?: A Little Help needed to walk in hospital room?: A Little Help needed climbing 3-5 steps with a railing? : A Lot 6 Click Score: 17    End of Session Equipment Utilized During Treatment: Gait belt Activity Tolerance: Patient tolerated treatment well Patient left: in bed;with call bell/phone within reach;with bed alarm set Nurse Communication: Mobility status PT Visit Diagnosis: Muscle weakness (generalized) (M62.81);Difficulty in walking, not elsewhere  classified (R26.2);Unsteadiness on feet (R26.81)    Time: 8373-8348 PT Time Calculation (min) (ACUTE ONLY): 25 min   Charges:   PT Evaluation $PT Eval Moderate Complexity: 1 Mod   PT General Charges $$ ACUTE PT VISIT: 1 Visit         Randall SAUNDERS, PT, DPT Acute Rehabilitation Services Office: [phone] Secure Chat  Preferred  Delon CHRISTELLA Callander 03/22/2024, 5:05 PM

## 2024-03-22 NOTE — Progress Notes (Signed)
 Mobility Specialist Progress Note:   03/22/24 1521  Mobility  Activity Ambulated with assistance (In hallway)  Level of Assistance Contact guard assist, steadying assist  Assistive Device Four wheel walker  Distance Ambulated (ft) 180 ft  Activity Response Tolerated well  Mobility Referral Yes  Mobility visit 1 Mobility  Mobility Specialist Start Time (ACUTE ONLY) 1441  Mobility Specialist Stop Time (ACUTE ONLY) 1510  Mobility Specialist Time Calculation (min) (ACUTE ONLY) 29 min   Received pt in chair and agreeable to mobility. Pt required MinG for safety. C/o pain, unspecified where. Returned to room without fault. Left pt in bed with alarm on. Personal belongings and call light within reach. All needs met.  Lavanda Pollack Mobility Specialist  Please contact via [employer] or  Rehab Office [phone]

## 2024-03-22 NOTE — Progress Notes (Signed)
 1 Day Post-Op   Subjective/Chief Complaint: Patient with mild improvement in abdominal pain.  Marginal ulcers noted on endoscopy.  Carafate started.   Objective: Vital signs in last 24 hours: Temp:  [97.9 F (36.6 C)-98.5 F (36.9 C)] 98 F (36.7 C) (10/22 0821) Pulse Rate:  [56-88] 74 (10/22 0821) Resp:  [17-18] 18 (10/22 0821) BP: (142-150)/(56-64) 150/64 (10/22 0821) SpO2:  [97 %-99 %] 99 % (10/22 0821) Last BM Date : 03/22/24  Intake/Output from previous day: No intake/output data recorded. Intake/Output this shift: No intake/output data recorded.  Abdomen: Soft nontender without rebound or guarding  Lab Results:  Recent Labs    03/21/24 0408 03/22/24 0426  WBC 10.0 8.8  HGB 11.2* 11.4*  HCT 34.0* 34.5*  PLT 251 272   BMET Recent Labs    03/21/24 0408 03/22/24 0426  NA 137 137  K 3.3* 3.6  CL 105 107  CO2 24 22  GLUCOSE 124* 126*  BUN 32* 23  CREATININE 1.18* 1.07*  CALCIUM  7.8* 8.1*   PT/INR No results for input(s): LABPROT, INR in the last 72 hours. ABG No results for input(s): PHART, HCO3 in the last 72 hours.  Invalid input(s): PCO2, PO2  Studies/Results: No results found.  Anti-infectives: Anti-infectives (From admission, onward)    Start     Dose/Rate Route Frequency Ordered Stop   03/19/24 0600  cefTRIAXone  (ROCEPHIN ) 2 g in sodium chloride  0.9 % 100 mL IVPB  Status:  Discontinued        2 g 200 mL/hr over 30 Minutes Intravenous Every 24 hours 03/18/24 1359 03/22/24 0806   03/18/24 2230  metroNIDAZOLE (FLAGYL) IVPB 500 mg        500 mg 100 mL/hr over 60 Minutes Intravenous Every 12 hours 03/18/24 2139     03/18/24 1500  cefTRIAXone  (ROCEPHIN ) 1 g in sodium chloride  0.9 % 100 mL IVPB        1 g 200 mL/hr over 30 Minutes Intravenous  Once 03/18/24 1412 03/18/24 1506   03/18/24 0530  cefTRIAXone  (ROCEPHIN ) 1 g in sodium chloride  0.9 % 100 mL IVPB        1 g 200 mL/hr over 30 Minutes Intravenous  Once 03/18/24 0524 03/18/24  0753       Assessment/Plan: 78 y.o. female with CKD, DM, hx of gastric bypass in 2012 by Dr. Adina Lunger, HTN, AKI on CKD presenting with several days of vomiting, epigastric pain, and generalized abdominal tenderness.    -Afebrile. -WBC 10.0. HGB 11.2   Afferent limb dilation/thickening  - UGI 10/20 was unremarkable - Continue IV abx - Continue PPI - EGD with marginal ulceration noted.  Recommend Carafate which we will follow.     Cholelithiasis-abdomen is soft nontender.  Doubt cholecystitis currently but will treat marginal ulcer first see if she improves.  Surgery will follow from periphery for now depending on clinical outcome and course.   LOS: 3 days    Debby DELENA Shipper 03/22/2024 Straightforward

## 2024-03-22 NOTE — Plan of Care (Signed)

## 2024-03-22 NOTE — Progress Notes (Signed)
 Scanner wasn't working in the room, meds and patient had to be overridden scan wise.

## 2024-03-23 DIAGNOSIS — K289 Gastrojejunal ulcer, unspecified as acute or chronic, without hemorrhage or perforation: Secondary | ICD-10-CM | POA: Diagnosis not present

## 2024-03-23 DIAGNOSIS — N179 Acute kidney failure, unspecified: Secondary | ICD-10-CM | POA: Diagnosis not present

## 2024-03-23 LAB — GLUCOSE, CAPILLARY
Glucose-Capillary: 139 mg/dL — ABNORMAL HIGH (ref 70–99)
Glucose-Capillary: 230 mg/dL — ABNORMAL HIGH (ref 70–99)

## 2024-03-23 MED ORDER — SUCRALFATE 1 GM/10ML PO SUSP: 1.0000 g | Freq: Three times a day (TID) | ORAL | 0 refills | Status: AC

## 2024-03-23 MED ORDER — ACETAMINOPHEN 500 MG PO TABS: 500.0000 mg | ORAL_TABLET | Freq: Three times a day (TID) | ORAL | 0 refills | Status: AC | PRN

## 2024-03-23 MED ORDER — PANTOPRAZOLE SODIUM 40 MG PO TBEC: 40.0000 mg | DELAYED_RELEASE_TABLET | Freq: Two times a day (BID) | ORAL | 0 refills | Status: AC

## 2024-03-23 NOTE — Care Management Important Message (Signed)
 Important Message  Patient Details  Name: Sharon Lowe MRN: [id number] Date of Birth: [birth year]   Important Message Given:  Yes - Medicare IM     Jon Cruel 03/23/2024, 3:22 PM

## 2024-03-23 NOTE — Progress Notes (Signed)
 OT Cancellation Note  Patient Details Name: Sharon Lowe MRN: [id number] DOB: 08/06/1945   Cancelled Treatment:    Reason Eval/Treat Not Completed: Other (comment)Pt is in the middle of eating breakfast, will try back later as schedule allows.   [name] 03/23/2024, 9:14 AM

## 2024-03-23 NOTE — Progress Notes (Signed)
 2 Days Post-Op   Subjective/Chief Complaint: Patient feels better today.  Eating breakfast felt pain.   Objective: Vital signs in last 24 hours: Temp:  [98.2 F (36.8 C)-98.5 F (36.9 C)] 98.5 F (36.9 C) (10/23 1100) Pulse Rate:  [57-90] 74 (10/23 1100) Resp:  [16-17] 17 (10/23 1100) BP: (139-154)/(70-101) 139/78 (10/23 1100) SpO2:  [99 %-100 %] 100 % (10/23 1100) Last BM Date : 03/22/24  Intake/Output from previous day: 10/22 0701 - 10/23 0700 In: 100 [IV Piggyback:100] Out: -  Intake/Output this shift: No intake/output data recorded.  Abdomen: Soft nontender without rebound or guarding  Lab Results:  Recent Labs    03/21/24 0408 03/22/24 0426  WBC 10.0 8.8  HGB 11.2* 11.4*  HCT 34.0* 34.5*  PLT 251 272   BMET Recent Labs    03/21/24 0408 03/22/24 0426  NA 137 137  K 3.3* 3.6  CL 105 107  CO2 24 22  GLUCOSE 124* 126*  BUN 32* 23  CREATININE 1.18* 1.07*  CALCIUM  7.8* 8.1*   PT/INR No results for input(s): LABPROT, INR in the last 72 hours. ABG No results for input(s): PHART, HCO3 in the last 72 hours.  Invalid input(s): PCO2, PO2  Studies/Results: No results found.  Anti-infectives: Anti-infectives (From admission, onward)    Start     Dose/Rate Route Frequency Ordered Stop   03/19/24 0600  cefTRIAXone  (ROCEPHIN ) 2 g in sodium chloride  0.9 % 100 mL IVPB  Status:  Discontinued        2 g 200 mL/hr over 30 Minutes Intravenous Every 24 hours 03/18/24 1359 03/22/24 0806   03/18/24 2230  metroNIDAZOLE (FLAGYL) IVPB 500 mg        500 mg 100 mL/hr over 60 Minutes Intravenous Every 12 hours 03/18/24 2139     03/18/24 1500  cefTRIAXone  (ROCEPHIN ) 1 g in sodium chloride  0.9 % 100 mL IVPB        1 g 200 mL/hr over 30 Minutes Intravenous  Once 03/18/24 1412 03/18/24 1506   03/18/24 0530  cefTRIAXone  (ROCEPHIN ) 1 g in sodium chloride  0.9 % 100 mL IVPB        1 g 200 mL/hr over 30 Minutes Intravenous  Once 03/18/24 0524 03/18/24 0753        Assessment/Plan: 78 y.o. female with CKD, DM, hx of gastric bypass in 2012 by Dr. Adina Lunger, HTN, AKI on CKD presenting with several days of vomiting, epigastric pain, and generalized abdominal tenderness   Marginal ulcers noted.  She is on Carafate and feels better  Cholelithiasis-no need for surgery since I think most of her pains from her marginal ulcerations  Needs follow-up at [facility] with bariatric surgeon  Stable for discharge from surgical standpoint  Surgery to sign off   LOS: 4 days    Debby A Tauri Ethington 03/23/2024 Moderate complexity

## 2024-03-23 NOTE — Discharge Summary (Addendum)
 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 F with obesity, hx gastric bypass, HTN, HLD, DM, CKD IIIb baseline 1.3-1.4 as recently as August, and recent treatment with Lasix for leg swelling who presents with upper abdominal pain, N/V, general weakness.      In the ER, Cr 2.35, WBC 14.1, and lipase 68. CT showed wall-thickening of afferent limb of gastric bypass, gallstones.    Admitted for AKI, abdominal pain. GI was consulted and she was found to have gastric ulcer on EGD, not actively bleeding. She was treated with sucralfate and PPI and symptoms greatly improved.   fecal h pylori antigen pending at the time of dc.   She tolerated advancing of her diet.  AKI resolved with supportive care.  She was discharged in stable condition.  PT/OT evaluated and recommended HH.   All other chronic conditions were treated with home medications.    Discharge Condition: Good, improved Recommended discharge diet: Regular healthy diet  Consultations: GI  Procedures/Studies: EGD.   Discharge Instructions     Discharge patient   Complete by: As directed    Discharge disposition: 06-Home-Health Care Svc   Discharge patient date: 03/23/2024      Allergies as of 03/23/2024        Reactions   Empagliflozin Other (See Comments)   Yeast infections   Sulfa Antibiotics Itching, Rash, Dermatitis        Medication List     STOP taking these medications    docusate sodium  100 MG capsule Commonly known as: COLACE   furosemide 40 MG tablet Commonly known as: LASIX   HYDROcodone -acetaminophen  7.5-325 MG tablet Commonly known as: NORCO   tiZANidine  4 MG tablet Commonly known as: ZANAFLEX        TAKE these medications    acetaminophen  500 MG tablet Commonly known as: TYLENOL  Take 1 tablet (500 mg total) by mouth every 8 (eight) hours as needed for mild pain (pain score 1-3) or moderate pain (pain score 4-6) (or headaches). What changed:  how much to take reasons to take this   atorvastatin  10 MG tablet Commonly known as: LIPITOR Take 10 mg by mouth at bedtime.   CALCIUM  600 PO Take 600 mg by mouth daily with breakfast.   ferrous sulfate  325 (65 FE) MG tablet Take 1 tablet (325 mg total) by mouth 3 (three) times daily after meals. What changed: when to take this   hydrochlorothiazide  25 MG tablet Commonly known as: HYDRODIURIL  Take 25 mg by mouth daily.   levothyroxine  88 MCG tablet Commonly known as: SYNTHROID  Take 88 mcg by mouth daily before breakfast.   metFORMIN  500 MG tablet Commonly known as: GLUCOPHAGE  Take 500 mg by mouth in the morning and at bedtime.   metoprolol  succinate 25 MG 24 hr tablet Commonly known  as: TOPROL -XL Take 25 mg by mouth daily.   multivitamin with minerals Tabs tablet Take 1-2 tablets by mouth daily with breakfast.   oxyCODONE -acetaminophen  10-325 MG tablet Commonly known as: PERCOCET Take 1 tablet by mouth 3 (three) times daily as needed for pain.   pantoprazole  40 MG tablet Commonly known as: Protonix  Take 1 tablet (40 mg total) by mouth 2 (two) times daily.   polyethylene glycol 17 g packet Commonly known as: MIRALAX  / GLYCOLAX  Take 17 g by mouth 2 (two) times daily. What changed:  when to take  this reasons to take this   sucralfate 1 GM/10ML suspension Commonly known as: CARAFATE Take 10 mLs (1 g total) by mouth 4 (four) times daily -  with meals and at bedtime.   telmisartan 80 MG tablet Commonly known as: MICARDIS Take 80 mg by mouth daily.   Trulicity 0.75 MG/0.5ML Soaj Generic drug: Dulaglutide Inject 0.75 mg into the skin every Saturday.   Vitamin B-12 2500 MCG Subl Place 2,500 mcg under the tongue every morning.   VITAMIN B-6 PO Take 1 tablet by mouth every morning.        Follow-up Information     [facility]) Follow up.   Specialty: Home Health Services Contact information: [address] [place] [address] [place]  [address] [phone]        Gastroenterology, Margarete. Schedule an appointment as soon as possible for a visit in 2 week(s).   Contact information: [address] [place] [address] [phone]                 Subjective   Pt reports feeling well. No abdominal pain or nausea. Feels ready to go home.   All questions and concerns were addressed at time of discharge.  Objective  Blood pressure 139/78, pulse 74, temperature 98.5 F (36.9 C), temperature source Oral, resp. rate 17, height 5' 2 (1.575 m), weight 72.2 kg, SpO2 100%.   General: Pt is alert, awake, not in acute distress Cardiovascular: RRR, S1/S2 +, no rubs, no gallops Respiratory: CTA bilaterally, no wheezing, no rhonchi Abdominal: Soft, NT, ND, bowel sounds + Extremities: no edema, no cyanosis  The results of significant diagnostics from this hospitalization (including imaging, microbiology, ancillary and laboratory) are listed below for reference.   Imaging studies: DG UGI W SMALL BOWEL Result Date: 03/20/2024 CLINICAL DATA:  [id number] History of Roux-en-Y gastric bypass [id number] History of Roux-en-Y bypass. Admitted in the hospital secondary to abdominal pain, CT abdomen showing dilated segment of  small bowel involving the afferent jejunal anastomoses. Consult for UGI/SBFT for further evaluation. EXAM: DG UGI W/ SMALL BOWEL TECHNIQUE: Single contrast examination was then performed using thin liquid barium. This exam was performed by Kimble Clas, PA-C, and was supervised and interpreted by Dr. Hughes. FLUOROSCOPY: Radiation Exposure Index (as provided by the fluoroscopic device): 51.0 mGy Kerma COMPARISON:  Small-bowel follow-through, 11/04/2010. CT AP, 03/18/2024 and 04/30/2014. FINDINGS: Esophagus:  Normal appearance. Esophageal motility:  Within normal limits. Gastroesophageal reflux:  None visualized. Ingested 13 mm barium tablet: Not given Stomach: Roux-en-Y bypass changes.  No gross abnormality Gastric emptying: Normal. Jejunum: Small focal area of bowel dilation of the jejunal anastomosis, similar to that on CT. Does not impede contrast passage. No indication of focal narrowing/stricture/mass. No contrast extravasation. Other:  None. IMPRESSION: 1. Surgical changes of Roux-en-Y gastric bypass changes. Otherwise, normal upper GI and small-bowel follow-through. 2. No overt dilatation at the enteral  anastomosis within the lower abdomen. No fluoroscopic evidence of obstruction, stenosis or stricture. Electronically Signed   By: Thom Hall M.D.   On: 03/20/2024 14:44   ECHOCARDIOGRAM COMPLETE Result Date: 03/19/2024    ECHOCARDIOGRAM REPORT   Patient Name:   Sharon Lowe Date of Exam: 03/19/2024 Medical Rec #:  [id number]     Height:       62.0 in Accession #:    [id number]    Weight:       159.2 lb Date of Birth:  02-13-1946     BSA:          1.735 m Patient Age:    78 years      BP:           162/95 mmHg Patient Gender: F             HR:           68 bpm. Exam Location:  Inpatient Procedure: 2D Echo (Both Spectral and Color Flow Doppler were utilized during            procedure). Indications:    congestive heart failure  History:        Patient has prior history of Echocardiogram examinations,  most                 recent 04/02/2014. Chronic kidney disease; Risk Factors:Diabetes,                 Hypertension and Current Smoker.  Sonographer:    Tinnie Barefoot RDCS Referring Phys: [id number] CHRISTOPHER P DANFORD  Sonographer Comments: Suboptimal subcostal window and patient is obese. Image acquisition challenging due to respiratory motion, patient in pain with minimal pressure. and Image acquisition challenging due to patient body habitus. IMPRESSIONS  1. Left ventricular ejection fraction, by estimation, is 60 to 65%. The left ventricle has normal function. The left ventricle has no regional wall motion abnormalities. Left ventricular diastolic parameters are consistent with Grade I diastolic dysfunction (impaired relaxation).  2. Right ventricular systolic function is moderately reduced. The right ventricular size is moderately enlarged.  3. Right atrial size was mildly dilated.  4. The mitral valve is abnormal. No evidence of mitral valve regurgitation. No evidence of mitral stenosis.  5. The aortic valve is normal in structure. Aortic valve regurgitation is not visualized. No aortic stenosis is present.  6. The inferior vena cava is normal in size with greater than 50% respiratory variability, suggesting right atrial pressure of 3 mmHg. FINDINGS  Left Ventricle: Left ventricular ejection fraction, by estimation, is 60 to 65%. The left ventricle has normal function. The left ventricle has no regional wall motion abnormalities. Strain was performed and the global longitudinal strain is indeterminate. The left ventricular internal cavity size was normal in size. There is no left ventricular hypertrophy. Left ventricular diastolic parameters are consistent with Grade I diastolic dysfunction (impaired relaxation). Right Ventricle: The right ventricular size is moderately enlarged. No increase in right ventricular wall thickness. Right ventricular systolic function is moderately reduced. Left Atrium: Left  atrial size was normal in size. Right Atrium: Right atrial size was mildly dilated. Pericardium: There is no evidence of pericardial effusion. Mitral Valve: The mitral valve is abnormal. There is mild thickening of the mitral valve leaflet(s). There is mild calcification of the mitral valve leaflet(s). Mild mitral annular calcification. No evidence of mitral valve regurgitation. No evidence of mitral valve stenosis. Tricuspid Valve: The tricuspid valve is normal in structure. Tricuspid valve regurgitation  is not demonstrated. No evidence of tricuspid stenosis. Aortic Valve: The aortic valve is normal in structure. Aortic valve regurgitation is not visualized. No aortic stenosis is present. Pulmonic Valve: The pulmonic valve was normal in structure. Pulmonic valve regurgitation is not visualized. No evidence of pulmonic stenosis. Aorta: The aortic root is normal in size and structure. Venous: The inferior vena cava is normal in size with greater than 50% respiratory variability, suggesting right atrial pressure of 3 mmHg. IAS/Shunts: The interatrial septum was not well visualized. Additional Comments: 3D was performed not requiring image post processing on an independent workstation and was indeterminate.  LEFT VENTRICLE PLAX 2D LVIDd:         4.00 cm LVIDs:         2.90 cm LVOT diam:     2.20 cm LV SV:         82 LV SV Index:   47 LVOT Area:     3.80 cm  RIGHT VENTRICLE RV Basal diam:  2.40 cm LEFT ATRIUM           Index        RIGHT ATRIUM           Index LA diam:      3.50 cm 2.02 cm/m   RA Area:     12.90 cm LA Vol (A4C): 63.8 ml 36.77 ml/m  RA Volume:   27.40 ml  15.79 ml/m  AORTIC VALVE LVOT Vmax:   127.33 cm/s LVOT Vmean:  77.133 cm/s LVOT VTI:    0.216 m  AORTA Ao Root diam: 3.30 cm Ao Asc diam:  3.10 cm  SHUNTS Systemic VTI:  0.22 m Systemic Diam: 2.20 cm Maude Emmer MD Electronically signed by Maude Emmer MD Signature Date/Time: 03/19/2024/4:03:49 PM    Final    US  Abdomen Complete Result Date:  03/18/2024 EXAM: COMPLETE ABDOMINAL ULTRASOUND TECHNIQUE: Real-time ultrasonography of the abdomen was performed. COMPARISON: CT of the abdomen and pelvis dated 04/30/2014. CLINICAL HISTORY: Epigastric pain. FINDINGS: LIVER: The liver is heterogeneously echogenic. No intrahepatic biliary ductal dilatation. No mass. BILIARY SYSTEM: There are multiple stones and sludge present within the gallbladder. The patient is also focally tender over the gallbladder. The gallbladder wall measures approximately 3.5 mm in thickness. Negative sonographic Murphy's sign. Common bile duct is within normal limits measuring 6 mm. KIDNEYS: Normal size and contour of kidneys. Normal cortical echogenicity. No hydronephrosis. No calculus. There is a simple exophytic cyst arising from the left kidney measuring approximately 2.6 cm in diameter. PANCREAS: Visualized portions of the pancreas are unremarkable. SPLEEN: No acute abnormality. Spleen is within normal limits in size. VESSELS: There is calcification within the wall of the abdominal aorta. Visualized portion of the inferior vena cava is normal. OTHER: No ascites. IMPRESSION: 1. Multiple gallstones and sludge with gallbladder wall thickening (3.5 mm) and focal tenderness, suggesting possible cholecystitis. 2. Heterogeneous echogenicity of the liver. 3. Simple exophytic cyst in the left kidney measuring 2.6 cm. 4. Calcification within the wall of the abdominal aorta. Electronically signed by: Evalene Coho MD 03/18/2024 11:26 AM EDT RP Workstation: [id number]   CT CHEST ABDOMEN PELVIS WO CONTRAST Result Date: 03/18/2024 EXAM: CT CHEST, ABDOMEN AND PELVIS WITHOUT CONTRAST 03/18/2024 03:35:00 AM TECHNIQUE: CT of the chest, abdomen and pelvis was performed without the administration of intravenous contrast. Multiplanar reformatted images are provided for review. Automated exposure control, iterative reconstruction, and/or weight based adjustment of the mA/kV was utilized to reduce  the radiation dose to as low as reasonably  achievable. COMPARISON: CT of the chest dated 10/27/2022. CLINICAL HISTORY: RLQ abdominal pain. Patient had difficulty holding still and laying flat. Chief complaints: Chest Pain. FINDINGS: CHEST: MEDIASTINUM AND LYMPH NODES: Heart and pericardium are unremarkable. The central airways are clear. No mediastinal, hilar or axillary lymphadenopathy. There is moderate calcific coronary artery disease. There is moderate calcific plaque also present within the aortic arch and descending thoracic aorta. LUNGS AND PLEURA: Minimal atelectasis present dependently within the lungs. No pleural effusion or pneumothorax. ABDOMEN AND PELVIS: LIVER: The liver is unremarkable. GALLBLADDER AND BILE DUCTS: There are stones and sludge within the gallbladder. No biliary ductal dilatation. SPLEEN: No acute abnormality. PANCREAS: No acute abnormality. ADRENAL GLANDS: No acute abnormality. KIDNEYS, URETERS AND BLADDER: There is a simple exophytic cyst arising from the lower pole of the left kidney, measuring approximately 3.5 cm in diameter. Per consensus, no follow-up is needed for simple Bosniak type 1 and 2 renal cysts, unless the patient has a malignancy history or risk factors. No stones in the kidneys or ureters. No hydronephrosis. No perinephric or periureteral stranding. Urinary bladder is unremarkable. GI AND BOWEL: The patient is status post gastric bypass surgery. There is a dilated segment of small bowel in the left upper quadrant, measuring approximately 3.4 cm in diameter. There is also small bowel wall thickening involving the afferent loop proximal to the jejunal anastomosis. Stomach demonstrates no acute abnormality. There is no bowel obstruction. REPRODUCTIVE ORGANS: The patient is status post hysterectomy and bilateral salpingo-oophorectomy. PERITONEUM AND RETROPERITONEUM: No ascites. No free air. VASCULATURE: Aorta is normal in caliber. ABDOMINAL AND PELVIS LYMPH NODES: No  lymphadenopathy. BONES AND SOFT TISSUES: Since the previous study, the patient has developed moderate-to-severe compression deformity of L1, but appears to be chronic. The patient is status post vertebral augmentation of a compression deformity of T11, as before. No focal soft tissue abnormality. IMPRESSION: 1. Small bowel wall thickening involving the afferent loop proximal to the jejunal anastomosis and a dilated segment of small bowel in the left upper quadrant, measuring approximately 3.4 cm in diameter. 2. Stones and sludge within the gallbladder. 3. Moderate calcific coronary artery disease and moderate calcific plaque within the aortic arch and descending thoracic aorta. Electronically signed by: Evalene Coho MD 03/18/2024 05:00 AM EDT RP Workstation: [id number]   DG Chest Portable 1 View Result Date: 03/18/2024 EXAM: 1 VIEW(S) XRAY OF THE CHEST 03/18/2024 01:48:02 AM COMPARISON: None available. CLINICAL HISTORY: chest pain. Chest pain; Patient unable to tolerate pressure from xray cassette FINDINGS: LUNGS AND PLEURA: Associated pleural thickening or small left pleural effusion. No focal pulmonary opacity. No pulmonary edema. No pneumothorax. HEART AND MEDIASTINUM: No acute abnormality of the cardiac and mediastinal silhouettes. BONES AND SOFT TISSUES: Chronic left rib fractures. IMPRESSION: 1. No acute cardiopulmonary process. 2. Chronic left fractures with associated small left pleural effusion or pleural thickening. Electronically signed by: Norman Gatlin MD 03/18/2024 01:52 AM EDT RP Workstation: [id number]   Labs: Basic Metabolic Panel: Recent Labs  Lab 03/18/24 0152 03/19/24 0810 03/20/24 0743 03/21/24 0408 03/22/24 0426  NA 135 136 139 137 137  K 3.7 3.0* 3.6 3.3* 3.6  CL 91* 100 106 105 107  CO2 25 21* 20* 24 22  GLUCOSE 149* 256* 156* 124* 126*  BUN 66* 47* 37* 32* 23  CREATININE 2.35* 1.53* 1.27* 1.18* 1.07*  CALCIUM  9.9 8.3* 7.8* 7.8* 8.1*   CBC: Recent Labs  Lab  03/18/24 0152 03/19/24 0810 03/20/24 0743 03/21/24 0408 03/22/24 0426  WBC 14.1* 12.9* 13.1*  10.0 8.8  NEUTROABS 11.3*  --   --   --   --   HGB 14.2 13.9 12.2 11.2* 11.4*  HCT 43.0 41.5 36.5 34.0* 34.5*  MCV 100.2* 99.8 100.0 100.6* 99.7  PLT 323 252 260 251 272   Microbiology: Results for orders placed or performed during the hospital encounter of 12/04/14  Surgical pcr screen     Status: None   Collection Time: 12/04/14  1:55 PM   Specimen: Nasal Mucosa; Nasal Swab  Result Value Ref Range Status   MRSA, PCR NEGATIVE NEGATIVE Final   Staphylococcus aureus NEGATIVE NEGATIVE Final    Time coordinating discharge: Over 30 minutes  Marien LITTIE Piety, MD  [facility] 03/23/2024, 11:18 AM

## 2024-03-23 NOTE — Progress Notes (Signed)
 [facility] Progress Note  SUBJECTIVE:   Interval history: Sharon Lowe was seen and evaluated today at bedside. Resting in chair at bedside. No abdominal pain. Tolerated breakfast. Plans are for discharge today.  Past Medical History:  Diagnosis Date   Arthritis    Bilateral ureteral calculi    Depression    mild   GERD (gastroesophageal reflux disease)    Heart murmur    asymptomatic   History of kidney stones 2015   History of sleep apnea    hx of prior to gastric bypass, no problems since   History of TIA (transient ischemic attack)    ~2000-- no residual   Hyperlipidemia    Hypertension    Hypothyroidism    Pneumonia    hx of as a child   Type 2 diabetes mellitus (HCC)    Urgency of urination    Wears contact lenses    Past Surgical History:  Procedure Laterality Date   CARPAL TUNNEL RELEASE Right    CATARACT EXTRACTION Bilateral 2015   CYSTOSCOPY W/ URETERAL STENT PLACEMENT Bilateral 12/05/2013   Procedure: CYSTOSCOPY WITH RETROGRADE PYELOGRAM/URETERAL STENT PLACEMENT;  Surgeon: Morene LELON Salines, MD;  Location: [facility];  Service: Urology;  Laterality: Bilateral;   CYSTOSCOPY WITH URETEROSCOPY AND STENT PLACEMENT Bilateral 12/20/2013   Procedure: CYSTOSCOPY WITH BILATERAL URETEROSCOPY, LASER LITHOTRIPSY AND STENT EXCHANGE;  Surgeon: Morene LELON Salines, MD;  Location: [facility];  Service: Urology;  Laterality: Bilateral;   ESOPHAGOGASTRODUODENOSCOPY N/A 03/21/2024   Procedure: EGD (ESOPHAGOGASTRODUODENOSCOPY);  Surgeon: Kriss Estefana DEL, DO;  Location: [facility];  Service: Gastroenterology;  Laterality: N/A;   EXPLORATOMY LAPAROTOMY FOR ABD. ABSCESS FROM MIGRATION OF IUD  1977   HOLMIUM LASER APPLICATION Bilateral 12/20/2013   Procedure: HOLMIUM LASER APPLICATION;  Surgeon: Morene LELON Salines, MD;  Location: [facility];  Service: Urology;  Laterality: Bilateral;   KNEE ARTHROSCOPY Right    LAPAROSCOPIC GASTRIC BYPASS   08-18-2010   AND REPAIR VENTRAL HERNIA   REVISION TOTAL KNEE ARTHROPLASTY Left 01-05-2005   SHOULDER OPEN ROTATOR CUFF REPAIR Left 07-12-2002   TONSILLECTOMY  CHILD   TOTAL ABDOMINAL HYSTERECTOMY W/ BILATERAL SALPINGOOPHORECTOMY  ~1995   TOTAL KNEE ARTHROPLASTY Left 11-17-2002   TOTAL KNEE ARTHROPLASTY Right 12/10/2014   Procedure: TOTAL KNEE ARTHROPLASTY;  Surgeon: Donnice Car, MD;  Location: [facility];  Service: Orthopedics;  Laterality: Right;   TOTAL KNEE REVISION WITH SCAR DEBRIDEMENT/PATELLA REVISION WITH POLY EXCHANGE Left 10/30/2013   Procedure: LEFT  TOTAL  KNEE  REVISION;  Surgeon: Donnice JONETTA Car, MD;  Location: [facility];  Service: Orthopedics;  Laterality: Left;   Current Facility-Administered Medications  Medication Dose Route Frequency Provider Last Rate Last Admin   acetaminophen  (TYLENOL ) tablet 650 mg  650 mg Oral Q6H PRN Kriss Estefana DEL, DO   650 mg at 03/22/24 9178   Or   acetaminophen  (TYLENOL ) suppository 650 mg  650 mg Rectal Q6H PRN Kriss Estefana DEL, DO       enoxaparin  (LOVENOX ) injection 40 mg  40 mg Subcutaneous Q24H Kriss Estefana H, DO   40 mg at 03/23/24 0934   famotidine (PEPCID) tablet 20 mg  20 mg Oral Daily Francois Elk H, DO   20 mg at 03/23/24 9066   feeding supplement (BOOST / RESOURCE BREEZE) liquid 1 Container  1 Container Oral TID BM Kriss Estefana DEL, DO   1 Container at 03/20/24 1100   hydrochlorothiazide  (HYDRODIURIL ) tablet 25 mg  25 mg Oral Daily Jonel Bruckner  P, MD   25 mg at 03/23/24 0933   Influenza vac split trivalent PF (FLUZONE  HIGH-DOSE) injection 0.5 mL  0.5 mL Intramuscular Tomorrow-1000 Danford, Lonni SQUIBB, MD       insulin  aspart (novoLOG ) injection 0-15 Units  0-15 Units Subcutaneous TID WC Zachari Alberta H, DO   2 Units at 03/23/24 0820   levothyroxine  (SYNTHROID ) tablet 88 mcg  88 mcg Oral Q0600 Kriss Estefana DEL, DO   88 mcg at 03/23/24 9379   metoprolol  succinate (TOPROL -XL) 24 hr tablet 25 mg  25 mg Oral Daily  Monish Haliburton H, DO   25 mg at 03/23/24 0933   metroNIDAZOLE (FLAGYL) IVPB 500 mg  500 mg Intravenous Q12H Kriss Estefana DEL, DO 100 mL/hr at 03/23/24 1011 500 mg at 03/23/24 1011   oxyCODONE -acetaminophen  (PERCOCET/ROXICET) 5-325 MG per tablet 1 tablet  1 tablet Oral Q6H PRN Kriss Estefana DEL, DO   1 tablet at 03/23/24 9378   And   oxyCODONE  (Oxy IR/ROXICODONE ) immediate release tablet 5 mg  5 mg Oral Q6H PRN Kriss Estefana H, DO   5 mg at 03/23/24 9379   pantoprazole  (PROTONIX ) injection 40 mg  40 mg Intravenous Q12H Kriss Estefana H, DO   40 mg at 03/23/24 9066   polyethylene glycol (MIRALAX  / GLYCOLAX ) packet 17 g  17 g Oral Daily Jonel Lonni SQUIBB, MD   17 g at 03/22/24 9177   prochlorperazine (COMPAZINE) injection 5 mg  5 mg Intravenous Q6H PRN Kriss Estefana H, DO       sucralfate (CARAFATE) 1 GM/10ML suspension 1 g  1 g Oral TID WC & HS Kriss Estefana H, DO   1 g at 03/23/24 0820   Allergies as of 03/18/2024 - Review Complete 03/18/2024  Allergen Reaction Noted   Empagliflozin Other (See Comments) 03/18/2024   Sulfa antibiotics Itching, Rash, and Dermatitis 01/07/2011   Review of Systems:  Review of Systems  Respiratory:  Negative for shortness of breath.   Cardiovascular:  Negative for chest pain.  Gastrointestinal:  Negative for abdominal pain, nausea and vomiting.    OBJECTIVE:   Temp:  [98.2 F (36.8 C)-98.5 F (36.9 C)] 98.5 F (36.9 C) (10/23 1100) Pulse Rate:  [57-90] 74 (10/23 1100) Resp:  [16-17] 17 (10/23 1100) BP: (139-154)/(70-101) 139/78 (10/23 1100) SpO2:  [99 %-100 %] 100 % (10/23 1100) Last BM Date : 03/22/24 Physical Exam Constitutional:      General: She is not in acute distress.    Appearance: She is not ill-appearing, toxic-appearing or diaphoretic.  Cardiovascular:     Rate and Rhythm: Normal rate and regular rhythm.  Pulmonary:     Effort: No respiratory distress.     Breath sounds: Normal breath sounds.  Abdominal:      General: Bowel sounds are normal. There is no distension.     Palpations: Abdomen is soft.     Tenderness: There is no abdominal tenderness. There is no guarding.  Neurological:     Mental Status: She is alert.     Labs: Recent Labs    03/21/24 0408 03/22/24 0426  WBC 10.0 8.8  HGB 11.2* 11.4*  HCT 34.0* 34.5*  PLT 251 272   BMET Recent Labs    03/21/24 0408 03/22/24 0426  NA 137 137  K 3.3* 3.6  CL 105 107  CO2 24 22  GLUCOSE 124* 126*  BUN 32* 23  CREATININE 1.18* 1.07*  CALCIUM  7.8* 8.1*   LFT Recent Labs    03/22/24 0426  PROT 5.2*  ALBUMIN 2.2*  AST 18  ALT 14  ALKPHOS 31*  BILITOT 0.7   PT/INR No results for input(s): LABPROT, INR in the last 72 hours. Diagnostic imaging: No results found.  IMPRESSION: Gastrojejunal anastomotic ulcer x 2 on EGD 03/21/24 Abdominal pain, improved, from above Abnormal CT imaging, small bowel wall thickening of afferent limb History Roux-en-Y gastric bypass Cholelithiasis and gallbladder sludge Intractable back pain, improved  PLAN: -Change to oral PPI, continue sucralfate -Follow up H.Pylori stool testing -Ok for discharge from GI perspective -Outpatient follow up with Dr. Saintclair in 4-6 weeks    LOS: 4 days   Estefana Keas, [place] [facility]

## 2024-03-23 NOTE — Discharge Instructions (Signed)
 Please continue to take the carafate with your meals.  Take protonix  twice per day.  The GI doctor office will call you with your H. Pylori test results when they are back and you can see them on Mychart as well.   Follow up with the GI doctor team in 2-3 weeks

## 2024-03-23 NOTE — TOC Initial Note (Addendum)
 Transition of Care (TOC) - Initial/Assessment Note   Spoke to patient and friend at bedside.   Patient from home alone. Patient has [employer] at home.   PT recommending home health PT . Per [facility] patient has had [facility] in past.   NCM provided patient and friend with medicare.gov rating list. They will review and make a few phone calls. NCM will follow up with them shortly. NCM asked then to decide on their top three agencies. NCM will need to confirm agency covers her exact address, accepts her insurance and have the staffing to accept referred. Both voiced understanding   Patient preference [facility], Hedda Deiters, Arlina Alexander with [facility] accepted referral .  Patient Details  Name: Sharon Lowe MRN: [id number] Date of Birth: 1946-01-10  [facility] CM/SW Contact:    Stephane Powell Jansky, RN Phone Number: 03/23/2024, 10:30 AM  Clinical Narrative:                   Expected Discharge Plan: Home w Home Health Services Barriers to Discharge: No Barriers Identified   Patient Goals and CMS Choice Patient states their goals for this hospitalization and ongoing recovery are:: to return to home CMS Medicare.gov Compare Post Acute Care list provided to:: Patient Choice offered to / list presented to : Patient      Expected Discharge Plan and Services   Discharge Planning Services: CM Consult Post Acute Care Choice: Home Health Living arrangements for the past 2 months: Single Family Home                 DME Arranged: N/A         HH Arranged: PT          Prior Living Arrangements/Services Living arrangements for the past 2 months: Single Family Home Lives with:: Self Patient language and need for interpreter reviewed:: Yes Do you feel safe going back to the place where you live?: Yes      Need for Family Participation in Patient Care: Yes (Comment) Care giver support system in place?: Yes (comment) Current home services: DME Criminal  Activity/Legal Involvement Pertinent to Current Situation/Hospitalization: No - Comment as needed  Activities of Daily Living   ADL Screening (condition at time of admission) Independently performs ADLs?: Yes (appropriate for developmental age) Is the patient deaf or have difficulty hearing?: No Does the patient have difficulty seeing, even when wearing glasses/contacts?: No Does the patient have difficulty concentrating, remembering, or making decisions?: No  Permission Sought/Granted   Permission granted to share information with : Yes, Verbal Permission Granted     Permission granted to share info w AGENCY: home health, [facility]        Emotional Assessment Appearance:: Appears stated age Attitude/Demeanor/Rapport: Engaged Affect (typically observed): Appropriate Orientation: : Oriented to Self, Oriented to Place, Oriented to  Time, Oriented to Situation Alcohol / Substance Use: Not Applicable Psych Involvement: No (comment)  Admission diagnosis:  Acute cystitis without hematuria [N30.00] AKI (acute kidney injury) [N17.9] Acute renal failure superimposed on stage 3b chronic kidney disease (HCC) [N17.9, N18.32] Patient Active Problem List   Diagnosis Date Noted   AKI (acute kidney injury) 03/19/2024   Acute renal failure superimposed on stage 3b chronic kidney disease (HCC) 03/18/2024   Obese 12/12/2014   S/P right TKA 12/10/2014   S/P knee replacement 12/10/2014   Osteopenia 12/20/2013   Biliary calculi 12/20/2013   Clinical depression 12/20/2013   Adult hypothyroidism 12/20/2013   Lower urinary  tract infectious disease 12/05/2013   Sepsis (HCC) 12/05/2013   Acute pyelonephritis: Probable 12/05/2013   Acute bilateral obstructive uropathy 12/05/2013   ARF (acute renal failure) 12/05/2013   Recurrent nephrolithiasis 12/05/2013   Expected blood loss anemia 10/31/2013   Overweight (BMI 25.0-29.9) 10/31/2013   Hyponatremia 10/31/2013   S/P left knee revision  10/30/2013   Chest pain 10/22/2013   Tobacco abuse 10/22/2013   Other and unspecified hyperlipidemia 10/22/2013   Leukocytosis 10/22/2013   Adrenal hyperplasia 12/28/2012   Elevated cortisol level 12/28/2012   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 [place] [address] Phone: [phone] Fax: [phone]     Social Drivers of Health (SDOH) Social History: SDOH Screenings   Food Insecurity: No Food Insecurity (03/18/2024)  Housing: Low Risk  (03/18/2024)  Transportation Needs: No Transportation Needs (03/18/2024)  Utilities: Not At Risk (03/18/2024)  Social Connections: Socially Isolated (03/18/2024)  Tobacco Use: High Risk (03/18/2024)   SDOH Interventions:     Readmission Risk Interventions     No data to display

## 2024-03-23 NOTE — Progress Notes (Signed)
 Discharge Nurse Summary: DC order noted per MD. DC RN at bedside with patient. Patient agreeable with discharge plan, states family will arrive soon for pickup. Dtr arrived shortly at the bedside. AVS printed/reviewed. PIV removed, skin intact. No DME needs. No home/TOC meds. CP/Edu resolved. Telemonitor not present on assessment. All belongings accounted for. Patient wheeled downstairs for discharge by private auto.   Rosario EMERSON Lund, RN

## 2024-03-23 NOTE — Plan of Care (Signed)
   Problem: Coping: Goal: Ability to adjust to condition or change in health will improve Outcome: Progressing

## 2024-03-24 LAB — H. PYLORI ANTIGEN, STOOL: H. Pylori Stool Ag, Eia: NEGATIVE

## 2024-03-29 DIAGNOSIS — R109 Unspecified abdominal pain: Secondary | ICD-10-CM | POA: Diagnosis not present

## 2024-03-29 DIAGNOSIS — R197 Diarrhea, unspecified: Secondary | ICD-10-CM | POA: Diagnosis not present

## 2024-06-28 ENCOUNTER — Other Ambulatory Visit (HOSPITAL_COMMUNITY): Payer: Self-pay
